# Patient Record
Sex: Female | Born: 1985
Health system: Southern US, Community
[De-identification: ages and names within clinical notes are randomized; demographics above are authoritative.]

## PROBLEM LIST (undated history)

## (undated) DIAGNOSIS — F329 Major depressive disorder, single episode, unspecified: Secondary | ICD-10-CM

## (undated) DIAGNOSIS — E282 Polycystic ovarian syndrome: Secondary | ICD-10-CM

## (undated) DIAGNOSIS — F319 Bipolar disorder, unspecified: Secondary | ICD-10-CM

## (undated) DIAGNOSIS — F32A Depression, unspecified: Secondary | ICD-10-CM

## (undated) DIAGNOSIS — R51 Headache: Secondary | ICD-10-CM

## (undated) DIAGNOSIS — R519 Headache, unspecified: Secondary | ICD-10-CM

## (undated) HISTORY — DX: Depression, unspecified: F32.A

## (undated) HISTORY — DX: Major depressive disorder, single episode, unspecified: F32.9

## (undated) HISTORY — DX: Polycystic ovarian syndrome: E28.2

## (undated) HISTORY — PX: CHOLECYSTECTOMY: SHX55

## (undated) HISTORY — PX: WISDOM TOOTH EXTRACTION: SHX21

## (undated) HISTORY — DX: Bipolar disorder, unspecified: F31.9

## (undated) HISTORY — DX: Headache, unspecified: R51.9

## (undated) HISTORY — PX: TONSILLECTOMY AND ADENOIDECTOMY: SUR1326

## (undated) HISTORY — DX: Headache: R51

---
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
## 2006-10-29 ENCOUNTER — Emergency Department: Payer: Self-pay | Admitting: Internal Medicine

## 2007-01-31 ENCOUNTER — Emergency Department: Payer: Self-pay | Admitting: Emergency Medicine

## 2007-02-03 ENCOUNTER — Emergency Department: Payer: Self-pay | Admitting: Emergency Medicine

## 2007-04-02 ENCOUNTER — Other Ambulatory Visit: Payer: Self-pay

## 2007-04-02 ENCOUNTER — Emergency Department: Payer: Self-pay

## 2007-04-05 ENCOUNTER — Emergency Department: Payer: Self-pay | Admitting: Internal Medicine

## 2007-08-10 ENCOUNTER — Emergency Department: Payer: Self-pay | Admitting: Emergency Medicine

## 2007-10-18 ENCOUNTER — Emergency Department: Payer: Self-pay | Admitting: Emergency Medicine

## 2008-04-14 ENCOUNTER — Emergency Department: Payer: Self-pay | Admitting: Emergency Medicine

## 2008-06-23 ENCOUNTER — Emergency Department: Payer: Self-pay | Admitting: Emergency Medicine

## 2009-07-06 ENCOUNTER — Emergency Department: Payer: Self-pay | Admitting: Emergency Medicine

## 2009-12-23 ENCOUNTER — Emergency Department: Payer: Self-pay | Admitting: Emergency Medicine

## 2010-02-07 ENCOUNTER — Emergency Department: Payer: Self-pay | Admitting: Emergency Medicine

## 2010-10-01 ENCOUNTER — Emergency Department: Payer: Self-pay | Admitting: Emergency Medicine

## 2011-05-31 ENCOUNTER — Emergency Department: Payer: Self-pay | Admitting: *Deleted

## 2011-12-04 ENCOUNTER — Emergency Department: Payer: Self-pay | Admitting: *Deleted

## 2011-12-04 LAB — URINALYSIS, COMPLETE
Bilirubin,UR: NEGATIVE
Blood: NEGATIVE
Glucose,UR: NEGATIVE mg/dL (ref 0–75)
Nitrite: NEGATIVE
Ph: 7 (ref 4.5–8.0)
Protein: NEGATIVE
RBC,UR: 1 /HPF (ref 0–5)
Specific Gravity: 1.011 (ref 1.003–1.030)
Squamous Epithelial: 5
WBC UR: 5 /HPF (ref 0–5)

## 2012-03-31 ENCOUNTER — Emergency Department: Payer: Self-pay | Admitting: *Deleted

## 2012-04-01 LAB — BASIC METABOLIC PANEL
Calcium, Total: 8.9 mg/dL (ref 8.5–10.1)
Chloride: 106 mmol/L (ref 98–107)
Co2: 26 mmol/L (ref 21–32)
Creatinine: 0.73 mg/dL (ref 0.60–1.30)
Potassium: 3.6 mmol/L (ref 3.5–5.1)
Sodium: 140 mmol/L (ref 136–145)

## 2012-04-01 LAB — CK TOTAL AND CKMB (NOT AT ARMC): CK-MB: <0.5 ng/mL — ABNORMAL LOW (ref 0.5–3.6)

## 2012-04-01 LAB — CBC
HCT: 38.9 % (ref 35.0–47.0)
HGB: 12.8 g/dL (ref 12.0–16.0)
MCH: 27.9 pg (ref 26.0–34.0)
MCHC: 32.8 g/dL (ref 32.0–36.0)
RDW: 14.4 % (ref 11.5–14.5)
WBC: 14.7 10*3/uL — ABNORMAL HIGH (ref 3.6–11.0)

## 2012-09-08 ENCOUNTER — Ambulatory Visit: Payer: Self-pay | Admitting: Family Medicine

## 2013-02-04 ENCOUNTER — Emergency Department: Payer: Self-pay | Admitting: Emergency Medicine

## 2013-02-04 LAB — URINALYSIS, COMPLETE
Bacteria: NONE SEEN
Bilirubin,UR: NEGATIVE
Blood: NEGATIVE
Glucose,UR: NEGATIVE mg/dL (ref 0–75)
Ketone: NEGATIVE
Nitrite: NEGATIVE
Protein: NEGATIVE
RBC,UR: 2 /HPF (ref 0–5)
Specific Gravity: 1.013 (ref 1.003–1.030)
WBC UR: 1 /HPF (ref 0–5)

## 2013-02-04 LAB — COMPREHENSIVE METABOLIC PANEL
Alkaline Phosphatase: 96 U/L (ref 50–136)
Anion Gap: 5 — ABNORMAL LOW (ref 7–16)
BUN: 8 mg/dL (ref 7–18)
Bilirubin,Total: 0.3 mg/dL (ref 0.2–1.0)
Co2: 28 mmol/L (ref 21–32)
Creatinine: 0.72 mg/dL (ref 0.60–1.30)
EGFR (African American): >60
EGFR (Non-African Amer.): >60
Osmolality: 273 (ref 275–301)
SGPT (ALT): 24 U/L (ref 12–78)
Sodium: 138 mmol/L (ref 136–145)

## 2013-02-04 LAB — CBC
MCHC: 33.6 g/dL (ref 32.0–36.0)
Platelet: 397 10*3/uL (ref 150–440)

## 2013-02-04 LAB — GC/CHLAMYDIA PROBE AMP

## 2013-05-03 ENCOUNTER — Emergency Department: Payer: Self-pay | Admitting: Emergency Medicine

## 2013-05-03 LAB — COMPREHENSIVE METABOLIC PANEL
Albumin: 3.7 g/dL (ref 3.4–5.0)
Alkaline Phosphatase: 103 U/L (ref 50–136)
Anion Gap: 4 — ABNORMAL LOW (ref 7–16)
Bilirubin,Total: 0.3 mg/dL (ref 0.2–1.0)
Calcium, Total: 8.8 mg/dL (ref 8.5–10.1)
Chloride: 106 mmol/L (ref 98–107)
Creatinine: 0.77 mg/dL (ref 0.60–1.30)
EGFR (African American): >60
EGFR (Non-African Amer.): >60
Glucose: 100 mg/dL — ABNORMAL HIGH (ref 65–99)
Osmolality: 273 (ref 275–301)
Potassium: 4.1 mmol/L (ref 3.5–5.1)
SGOT(AST): 18 U/L (ref 15–37)
SGPT (ALT): 24 U/L (ref 12–78)
Total Protein: 7.7 g/dL (ref 6.4–8.2)

## 2013-05-03 LAB — LIPASE, BLOOD: Lipase: 123 U/L (ref 73–393)

## 2013-05-03 LAB — CBC
HCT: 40.9 % (ref 35.0–47.0)
HGB: 13.7 g/dL (ref 12.0–16.0)
MCH: 28.7 pg (ref 26.0–34.0)
MCHC: 33.6 g/dL (ref 32.0–36.0)
MCV: 86 fL (ref 80–100)
WBC: 11.5 10*3/uL — ABNORMAL HIGH (ref 3.6–11.0)

## 2013-05-03 LAB — URINALYSIS, COMPLETE
Blood: NEGATIVE
Ketone: NEGATIVE
Nitrite: NEGATIVE
Ph: 5 (ref 4.5–8.0)
RBC,UR: 2 /HPF (ref 0–5)
Squamous Epithelial: 3

## 2013-05-03 LAB — PREGNANCY, URINE: Pregnancy Test, Urine: NEGATIVE m[IU]/mL

## 2013-09-12 ENCOUNTER — Emergency Department: Payer: Self-pay | Admitting: Internal Medicine

## 2014-03-10 ENCOUNTER — Emergency Department: Payer: Self-pay | Admitting: Emergency Medicine

## 2014-03-17 ENCOUNTER — Emergency Department: Payer: Self-pay | Admitting: Emergency Medicine

## 2014-06-01 ENCOUNTER — Emergency Department: Payer: Self-pay | Admitting: Emergency Medicine

## 2014-06-01 LAB — URINALYSIS, COMPLETE
Bacteria: NONE SEEN
Bilirubin,UR: NEGATIVE
Blood: NEGATIVE
Glucose,UR: NEGATIVE mg/dL (ref 0–75)
Ketone: NEGATIVE
Leukocyte Esterase: NEGATIVE
NITRITE: NEGATIVE
PROTEIN: NEGATIVE
Ph: 6 (ref 4.5–8.0)
RBC,UR: NONE SEEN /HPF (ref 0–5)
SPECIFIC GRAVITY: 1.01 (ref 1.003–1.030)
Squamous Epithelial: 6
WBC UR: 4 /HPF (ref 0–5)

## 2014-06-01 LAB — COMPREHENSIVE METABOLIC PANEL
AST: 16 U/L (ref 15–37)
Albumin: 3.5 g/dL (ref 3.4–5.0)
Alkaline Phosphatase: 87 U/L
Anion Gap: 6 — ABNORMAL LOW (ref 7–16)
BUN: 9 mg/dL (ref 7–18)
Bilirubin,Total: 0.3 mg/dL (ref 0.2–1.0)
CALCIUM: 8.6 mg/dL (ref 8.5–10.1)
CHLORIDE: 107 mmol/L (ref 98–107)
CO2: 27 mmol/L (ref 21–32)
Creatinine: 0.77 mg/dL (ref 0.60–1.30)
EGFR (African American): >60
Glucose: 87 mg/dL (ref 65–99)
Osmolality: 277 (ref 275–301)
POTASSIUM: 3.4 mmol/L — AB (ref 3.5–5.1)
SGPT (ALT): 23 U/L
SODIUM: 140 mmol/L (ref 136–145)
TOTAL PROTEIN: 7.4 g/dL (ref 6.4–8.2)

## 2014-06-01 LAB — CBC
HCT: 40.2 % (ref 35.0–47.0)
HGB: 12.9 g/dL (ref 12.0–16.0)
MCH: 27.6 pg (ref 26.0–34.0)
MCHC: 32 g/dL (ref 32.0–36.0)
MCV: 86 fL (ref 80–100)
Platelet: 431 10*3/uL (ref 150–440)
RBC: 4.67 10*6/uL (ref 3.80–5.20)
RDW: 14.2 % (ref 11.5–14.5)
WBC: 9.2 10*3/uL (ref 3.6–11.0)

## 2014-06-01 LAB — LIPASE, BLOOD: Lipase: 117 U/L (ref 73–393)

## 2014-06-08 ENCOUNTER — Ambulatory Visit: Payer: Self-pay | Admitting: Surgery

## 2014-06-09 HISTORY — PX: OTHER SURGICAL HISTORY: SHX169

## 2014-06-12 LAB — PATHOLOGY REPORT

## 2014-08-08 ENCOUNTER — Ambulatory Visit: Payer: Self-pay | Admitting: Psychiatry

## 2014-08-08 LAB — BASIC METABOLIC PANEL
Anion Gap: 6 — ABNORMAL LOW (ref 7–16)
BUN: 10 mg/dL (ref 7–18)
CO2: 26 mmol/L (ref 21–32)
CREATININE: 0.72 mg/dL (ref 0.60–1.30)
Calcium, Total: 8.4 mg/dL — ABNORMAL LOW (ref 8.5–10.1)
Chloride: 107 mmol/L (ref 98–107)
EGFR (Non-African Amer.): >60
GLUCOSE: 109 mg/dL — AB (ref 65–99)
OSMOLALITY: 277 (ref 275–301)
Potassium: 3.8 mmol/L (ref 3.5–5.1)
Sodium: 139 mmol/L (ref 136–145)

## 2014-08-08 LAB — LITHIUM LEVEL: Lithium: <0.2 mmol/L — ABNORMAL LOW

## 2014-08-08 LAB — TSH: THYROID STIMULATING HORM: 2.2 u[IU]/mL

## 2014-09-04 ENCOUNTER — Ambulatory Visit: Payer: Self-pay | Admitting: Psychiatry

## 2014-09-04 LAB — TSH: THYROID STIMULATING HORM: 2.43 u[IU]/mL

## 2014-09-04 LAB — BASIC METABOLIC PANEL
Anion Gap: 8 (ref 7–16)
BUN: 8 mg/dL (ref 7–18)
CHLORIDE: 107 mmol/L (ref 98–107)
CO2: 25 mmol/L (ref 21–32)
CREATININE: 0.67 mg/dL (ref 0.60–1.30)
Calcium, Total: 8.4 mg/dL — ABNORMAL LOW (ref 8.5–10.1)
EGFR (African American): >60
EGFR (Non-African Amer.): >60
Glucose: 78 mg/dL (ref 65–99)
Osmolality: 277 (ref 275–301)
Potassium: 3.8 mmol/L (ref 3.5–5.1)
SODIUM: 140 mmol/L (ref 136–145)

## 2014-09-04 LAB — LITHIUM LEVEL: Lithium: 0.35 mmol/L — ABNORMAL LOW

## 2014-12-23 NOTE — Op Note (Signed)
PATIENT NAME:  Alexis Rangel, Devika L MR#:  [id number] DATE OF BIRTH:  March 25, 1986  DATE OF PROCEDURE:  06/09/2014  PREOPERATIVE DIAGNOSIS: Symptomatic cholelithiasis.   POSTOPERATIVE DIAGNOSIS: Symptomatic cholelithiasis and chronic cholecystitis.   PROCEDURE PERFORMED:  Laparoscopic cholecystectomy.   ANESTHESIA: General.   ESTIMATED BLOOD LOSS: 15 mL.   COMPLICATIONS: None.   SPECIMENS: Gallbladder.   INDICATION FOR SURGERY: Miss [name] is a pleasant 29 year old female with recurrent right upper quadrant pain after fatty food ingestion and a CT scan which showed stones. Her pain was consistent with biliary disease and she was brought to the operating room suite for laparoscopic cholecystectomy.   DETAILS OF PROCEDURE: Informed consent was obtained. Miss [name] was brought to the operating room suite. She was induced. Endotracheal tube was placed, general anesthesia was administered. The abdomen was prepped and draped in standard surgical fashion. A timeout was performed, correctly identifying patient name, operative site, and procedure to be performed. A supraumbilical incision was made and was deepened down to the fascia. The fascia was incised. Peritoneum was entered. Two stay sutures were placed through the fasciotomy. A Hassan trocar was placed in the abdomen. An 11 mm epigastric and two 5 mm right subcostal trocars were placed at the midclavicular and anterior axillary line.  There was a large amount of adhesions to the liver and to the gallbladder, these were taken down. There was a small amount of liver which was decapsulated in the process. The gallbladder then when free was lifted over the dome of the liver and further adhesiolysis was performed and eventually the cystic artery and cystic duct were dissected out. The critical view was obtained. Three clips were placed across the cystic artery and cystic duct and ligated. The gallbladder was then taken off the gallbladder fossa and  brought out through an Endo Catch bag using hook cautery. The gallbladder fossa was then examined for hemostasis, it was made hemostatic. The abdomen was irrigated. After hemostasis was obtained and the irrigated fluid was cleared the abdomen was desufflated, all trocars were taken out under direct visualization. The previous figure-of-eight 0 Vicryl was used to close the supraumbilical fascia. The skin and sutures were infiltrated with 1% lidocaine with epinephrine. The skin was then closed with interrupted 4-0 Monocryl deep dermal sutures. Steri-Strips, Telfa gauze, and Tegaderm were used to complete the dressing. The patient was then awoken, extubated, and brought to the postanesthesia care unit. There were no immediate complications.  Needle, sponge, and instrument counts were correct at the end of the procedure.    ____________________________ Si Raiderhristopher A. Malay Fantroy, MD cal:bu D: 06/09/2014 19:18:00 ET T: 06/09/2014 20:07:07 ET JOB#: [id number]  cc: Cristal Deerhristopher A. Jahvier Aldea, MD, <Dictator> Jarvis NewcomerHRISTOPHER A Tyberius Ryner MD ELECTRONICALLY SIGNED 06/19/2014 7:09

## 2015-05-08 ENCOUNTER — Encounter: Payer: Self-pay | Admitting: Emergency Medicine

## 2015-05-08 ENCOUNTER — Emergency Department
Admission: EM | Admit: 2015-05-08 | Discharge: 2015-05-08 | Disposition: A | Discharge status: Home / Self Care | Payer: PRIVATE HEALTH INSURANCE | Attending: Emergency Medicine | Admitting: Emergency Medicine

## 2015-05-08 DIAGNOSIS — R2243 Localized swelling, mass and lump, lower limb, bilateral: Secondary | ICD-10-CM | POA: Diagnosis present

## 2015-05-08 DIAGNOSIS — R609 Edema, unspecified: Secondary | ICD-10-CM

## 2015-05-08 DIAGNOSIS — R600 Localized edema: Secondary | ICD-10-CM | POA: Insufficient documentation

## 2015-05-08 LAB — COMPREHENSIVE METABOLIC PANEL
ALT: 26 U/L (ref 14–54)
AST: 26 U/L (ref 15–41)
Albumin: 4 g/dL (ref 3.5–5.0)
Alkaline Phosphatase: 89 U/L (ref 38–126)
Anion gap: 7 (ref 5–15)
BUN: 11 mg/dL (ref 6–20)
CHLORIDE: 99 mmol/L — AB (ref 101–111)
CO2: 23 mmol/L (ref 22–32)
CREATININE: 0.75 mg/dL (ref 0.44–1.00)
Calcium: 8.4 mg/dL — ABNORMAL LOW (ref 8.9–10.3)
GFR calc non Af Amer: >60 mL/min (ref >60)
Glucose, Bld: 88 mg/dL (ref 65–99)
Potassium: 3.7 mmol/L (ref 3.5–5.1)
SODIUM: 129 mmol/L — AB (ref 135–145)
Total Bilirubin: 0.5 mg/dL (ref 0.3–1.2)
Total Protein: 7.7 g/dL (ref 6.5–8.1)

## 2015-05-08 LAB — CBC
HEMATOCRIT: 39 % (ref 35.0–47.0)
HEMOGLOBIN: 12.8 g/dL (ref 12.0–16.0)
MCH: 27.3 pg (ref 26.0–34.0)
MCHC: 32.8 g/dL (ref 32.0–36.0)
MCV: 83.1 fL (ref 80.0–100.0)
Platelets: 453 10*3/uL — ABNORMAL HIGH (ref 150–440)
RBC: 4.7 MIL/uL (ref 3.80–5.20)
RDW: 14 % (ref 11.5–14.5)
WBC: 11.7 10*3/uL — AB (ref 3.6–11.0)

## 2015-05-08 MED ORDER — FUROSEMIDE 20 MG PO TABS: 20.0000 mg | ORAL_TABLET | Freq: Every day | ORAL | Status: DC

## 2015-05-08 NOTE — ED Provider Notes (Signed)
[name] Emergency Department Provider Note     Time seen: ----------------------------------------- 6:13 PM on 05/08/2015 -----------------------------------------    I have reviewed the triage vital signs and the nursing notes.   HISTORY  Chief Complaint Leg Swelling    HPI Alexis Rangel is a 29 y.o. female who presents to ER with bilateral lower extremity swelling for the past several months. She denies any other symptoms. Patient states swelling is worse at night, gets better throughout the day. She states she has been gaining weight over the last year, stands a lot at work. She denies fevers chills or other complaints. She also denies chest pain or shortness of breath   History reviewed. No pertinent past medical history.  There are no active problems to display for this patient.   History reviewed. No pertinent past surgical history.  Allergies Review of patient's allergies indicates no known allergies.  Social History Social History  Substance Use Topics  . Smoking status: Never Smoker   . Smokeless tobacco: None  . Alcohol Use: No    Review of Systems Constitutional: Negative for fever. Eyes: Negative for visual changes. ENT: Negative for sore throat. Cardiovascular: Negative for chest pain. Respiratory: Negative for shortness of breath. Gastrointestinal: Negative for abdominal pain, vomiting and diarrhea. Genitourinary: Negative for dysuria. Musculoskeletal: Negative for back pain. Positive for lower extremity swelling bilaterally Skin: Negative for rash. Neurological: Negative for headaches, focal weakness or numbness.  10-point ROS otherwise negative.  ____________________________________________   PHYSICAL EXAM:  VITAL SIGNS: ED Triage Vitals  Enc Vitals Group     BP 05/08/15 1701 130/88 mmHg     Pulse Rate 05/08/15 1701 91     Resp 05/08/15 1701 20     Temp 05/08/15 1701 98.4 F (36.9 C)     Temp Source  05/08/15 1701 Oral     SpO2 05/08/15 1701 100 %     Weight 05/08/15 1701 200 lb (90.719 kg)     Height 05/08/15 1701  (1.575 m)     Head Cir --      Peak Flow --      Pain Score --      Pain Loc --      Pain Edu? --      Excl. in GC? --     Constitutional: Alert and oriented. Well appearing and in no distress. Eyes: Conjunctivae are normal. PERRL. Normal extraocular movements. ENT   Head: Normocephalic and atraumatic.   Nose: No congestion/rhinnorhea.   Mouth/Throat: Mucous membranes are moist.   Neck: No stridor. Cardiovascular: Normal rate, regular rhythm. Normal and symmetric distal pulses are present in all extremities. No murmurs, rubs, or gallops. Respiratory: Normal respiratory effort without tachypnea nor retractions. Breath sounds are clear and equal bilaterally. No wheezes/rales/rhonchi. Gastrointestinal: Soft and nontender. No distention. No abdominal bruits.  Musculoskeletal: Nontender with normal range of motion in all extremities. No joint effusions.  No lower extremity tenderness nor edema. Neurologic:  Normal speech and language. No gross focal neurologic deficits are appreciated. Speech is normal. No gait instability. Skin:  Skin is warm, dry and intact. No rash noted. Psychiatric: Mood and affect are normal. Speech and behavior are normal. Patient exhibits appropriate insight and judgment.  ____________________________________________  ED COURSE:  Pertinent labs & imaging results that were available during my care of the patient were reviewed by me and considered in my medical decision making (see chart for details). Patient with nonspecific lower extremity edema, will check basic labs.  ____________________________________________    LABS (pertinent positives/negatives)  Labs Reviewed  CBC - Abnormal; Notable for the following:    WBC 11.7 (*)    Platelets 453 (*)    All other components within normal limits  COMPREHENSIVE METABOLIC PANEL  - Abnormal; Notable for the following:    Sodium 129 (*)    Chloride 99 (*)    Calcium 8.4 (*)    All other components within normal limits    ____________________________________________  FINAL ASSESSMENT AND PLAN  Edema  Plan: Patient with labs and imaging as dictated above. Edema is not significantly visible on physical examination. She'll be given low-dose Lasix and encouraged follow-up with her doctor.   Emily Filbert, MD   Emily Filbert, MD 05/08/15 [phone]

## 2015-05-08 NOTE — ED Notes (Signed)
Patient to ED with c/o bilateral lower leg swelling for the last couple of months, denies associated symptoms.

## 2015-05-08 NOTE — Discharge Instructions (Signed)
Peripheral Edema °You have swelling in your legs (peripheral edema). This swelling is due to excess accumulation of salt and water in your body. Edema may be a sign of heart, kidney or liver disease, or a side effect of a medication. It may also be due to problems in the leg veins. Elevating your legs and using special support stockings may be very helpful, if the cause of the swelling is due to poor venous circulation. Avoid long periods of standing, whatever the cause. °Treatment of edema depends on identifying the cause. Chips, pretzels, pickles and other salty foods should be avoided. Restricting salt in your diet is almost always needed. Water pills (diuretics) are often used to remove the excess salt and water from your body via urine. These medicines prevent the kidney from reabsorbing sodium. This increases urine flow. °Diuretic treatment may also result in lowering of potassium levels in your body. Potassium supplements may be needed if you have to use diuretics daily. Daily weights can help you keep track of your progress in clearing your edema. You should call your caregiver for follow up care as recommended. °SEEK IMMEDIATE MEDICAL CARE IF:  °· You have increased swelling, pain, redness, or heat in your legs. °· You develop shortness of breath, especially when lying down. °· You develop chest or abdominal pain, weakness, or fainting. °· You have a fever. °Document Released: 09/25/2004 Document Revised: 11/10/2011 Document Reviewed: 09/05/2009 °ExitCare® Patient Information ©2015 [employer]. This information is not intended to replace advice given to you by your health care provider. Make sure you discuss any questions you have with your health care provider. ° °

## 2015-10-16 DIAGNOSIS — K8020 Calculus of gallbladder without cholecystitis without obstruction: Secondary | ICD-10-CM | POA: Insufficient documentation

## 2015-10-17 ENCOUNTER — Encounter: Payer: Self-pay | Admitting: Unknown Physician Specialty

## 2015-10-17 ENCOUNTER — Ambulatory Visit (INDEPENDENT_AMBULATORY_CARE_PROVIDER_SITE_OTHER): Payer: BLUE CROSS/BLUE SHIELD | Admitting: Unknown Physician Specialty

## 2015-10-17 VITALS — BP 121/82 | HR 86 | Temp 98.2°F | Ht 62.0 in | Wt 216.6 lb

## 2015-10-17 DIAGNOSIS — R112 Nausea with vomiting, unspecified: Secondary | ICD-10-CM

## 2015-10-17 DIAGNOSIS — K529 Noninfective gastroenteritis and colitis, unspecified: Secondary | ICD-10-CM | POA: Diagnosis not present

## 2015-10-17 DIAGNOSIS — G43909 Migraine, unspecified, not intractable, without status migrainosus: Secondary | ICD-10-CM | POA: Insufficient documentation

## 2015-10-17 DIAGNOSIS — E669 Obesity, unspecified: Secondary | ICD-10-CM | POA: Insufficient documentation

## 2015-10-17 DIAGNOSIS — G43001 Migraine without aura, not intractable, with status migrainosus: Secondary | ICD-10-CM

## 2015-10-17 DIAGNOSIS — J029 Acute pharyngitis, unspecified: Secondary | ICD-10-CM | POA: Diagnosis not present

## 2015-10-17 LAB — PREGNANCY, URINE: PREG TEST UR: NEGATIVE

## 2015-10-17 NOTE — Progress Notes (Signed)
BP 121/82 mmHg  Pulse 86  Temp(Src) 98.2 F (36.8 C)  Ht  (1.575 m)  Wt 216 lb 9.6 oz (98.249 kg)  BMI 39.61 kg/m2  SpO2 100%  LMP 10/04/2015 (Approximate)   Subjective:    Patient ID: Alexis Rangel, female    DOB: 12/26/1985, 30 y.o.   MRN: [id number]  HPI: Alexis Rangel is a 30 y.o. female  Chief Complaint  Patient presents with  . Emesis    pt states she has been throwing up since yesterday morning. States she has also been gagging and feels like something is stuck in her throat. States she has tried taking nyquil  . Migraine    pt states she has been having migraines for 2 to 3 months   Nausea/vomiting Nausea, vomiting, and diarrhea.  She has vomited twice yesterday but went out last night for dinner and has not vomited but is nauseated. Keeping down fluids.  Having intermittent diarrhea.  No fever.  On BCPs and does occasionally miss them.  LMP was 2 weeks ago  Migraines  States they start in the back of her neck and radiates to above eyes.  She needs to turn off the lights as it throbs.  Takes Tylenol which helps for an hour.  Drinks a lot of [employer].  Her headaches have been going on for years and worse in the last 3 months.  She states her father does have a history of migraines and is on medications for this. She does admit to increased stress in the last 3-4 months  Obesity Frustrated with weight gain  Pharyngitis For 3 days now.  This preceded her GI symptoms  Relevant past medical, surgical, family and social history reviewed and updated as indicated. Interim medical history since our last visit reviewed. Allergies and medications reviewed and updated.  Review of Systems  Constitutional: Negative.   HENT: Positive for sore throat.   Eyes: Negative.   Respiratory: Negative.   Genitourinary: Negative.     Per HPI unless specifically indicated above     Objective:    BP 121/82 mmHg  Pulse 86  Temp(Src) 98.2 F (36.8 C)  Ht   (1.575 m)  Wt 216 lb 9.6 oz (98.249 kg)  BMI 39.61 kg/m2  SpO2 100%  LMP 10/04/2015 (Approximate)  Wt Readings from Last 3 Encounters:  10/17/15 216 lb 9.6 oz (98.249 kg)  07/17/14 205 lb (92.987 kg)  05/08/15 200 lb (90.719 kg)    Physical Exam  Constitutional: She is oriented to person, place, and time. She appears well-developed and well-nourished. No distress.  HENT:  Head: Normocephalic and atraumatic.  Eyes: Conjunctivae and lids are normal. Right eye exhibits no discharge. Left eye exhibits no discharge. No scleral icterus.  Neck: Normal range of motion. Neck supple. No JVD present. Carotid bruit is not present.  Cardiovascular: Normal rate, regular rhythm and normal heart sounds.   Pulmonary/Chest: Effort normal and breath sounds normal.  Abdominal: Soft. Normal appearance and bowel sounds are normal. She exhibits no distension. There is no splenomegaly or hepatomegaly. There is no tenderness. There is no rebound and no guarding.  Musculoskeletal: Normal range of motion.  Neurological: She is alert and oriented to person, place, and time.  Skin: Skin is warm, dry and intact. No rash noted. No pallor.  Psychiatric: She has a normal mood and affect. Her behavior is normal. Judgment and thought content normal.   Pregnancy test is negative Strep is negative  Results for orders placed or performed during the hospital encounter of 05/08/15  CBC  Result Value Ref Range   WBC 11.7 (H) 3.6 - 11.0 K/uL   RBC 4.70 3.80 - 5.20 MIL/uL   Hemoglobin 12.8 12.0 - 16.0 g/dL   HCT 54.0 98.1 - 19.1 %   MCV 83.1 80.0 - 100.0 fL   MCH 27.3 26.0 - 34.0 pg   MCHC 32.8 32.0 - 36.0 g/dL   RDW 47.8 29.5 - 62.1 %   Platelets 453 (H) 150 - 440 K/uL  Comprehensive metabolic panel  Result Value Ref Range   Sodium 129 (L) 135 - 145 mmol/L   Potassium 3.7 3.5 - 5.1 mmol/L   Chloride 99 (L) 101 - 111 mmol/L   CO2 23 22 - 32 mmol/L   Glucose, Bld 88 65 - 99 mg/dL   BUN 11 6 - 20 mg/dL    Creatinine, Ser 3.08 0.44 - 1.00 mg/dL   Calcium 8.4 (L) 8.9 - 10.3 mg/dL   Total Protein 7.7 6.5 - 8.1 g/dL   Albumin 4.0 3.5 - 5.0 g/dL   AST 26 15 - 41 U/L   ALT 26 14 - 54 U/L   Alkaline Phosphatase 89 38 - 126 U/L   Total Bilirubin 0.5 0.3 - 1.2 mg/dL   GFR calc non Af Amer >60 >60 mL/min   GFR calc Af Amer >60 >60 mL/min   Anion gap 7 5 - 15      Assessment & Plan:   Problem List Items Addressed This Visit      Unprioritized   Obesity    Discussed decreasing and eliminating Mountain Dew      Migraine    Usually during periods.  Change to non-cyclical BCPs.  No aura.  Gradually cut back on soft drinks.  exercise       Other Visit Diagnoses    Non-intractable vomiting with nausea, vomiting of unspecified type    -  Primary    Relevant Orders    Pregnancy, urine    Gastroenteritis        Supportive care    Pharyngitis        Relevant Orders    Rapid strep screen (not at [facility])        Follow up plan: Return in about 1 month (around 11/14/2015).

## 2015-10-17 NOTE — Assessment & Plan Note (Signed)
Discussed decreasing and eliminating [facility]

## 2015-10-17 NOTE — Assessment & Plan Note (Signed)
Usually during periods.  Change to non-cyclical BCPs.  No aura.  Gradually cut back on soft drinks.  exercise

## 2015-10-17 NOTE — Patient Instructions (Addendum)
Migraine Headache A migraine headache is an intense, throbbing pain on one or both sides of your head. A migraine can last for 30 minutes to several hours. CAUSES  The exact cause of a migraine headache is not always known. However, a migraine may be caused when nerves in the brain become irritated and release chemicals that cause inflammation. This causes pain. Certain things may also trigger migraines, such as:  Alcohol.  Smoking.  Stress.  Menstruation.  Aged cheeses.  Foods or drinks that contain nitrates, glutamate, aspartame, or tyramine.  Lack of sleep.  Chocolate.  Caffeine.  Hunger.  Physical exertion.  Fatigue.  Medicines used to treat chest pain (nitroglycerine), birth control pills, estrogen, and some blood pressure medicines. SIGNS AND SYMPTOMS  Pain on one or both sides of your head.  Pulsating or throbbing pain.  Severe pain that prevents daily activities.  Pain that is aggravated by any physical activity.  Nausea, vomiting, or both.  Dizziness.  Pain with exposure to bright lights, loud noises, or activity.  General sensitivity to bright lights, loud noises, or smells. Before you get a migraine, you may get warning signs that a migraine is coming (aura). An aura may include:  Seeing flashing lights.  Seeing bright spots, halos, or zigzag lines.  Having tunnel vision or blurred vision.  Having feelings of numbness or tingling.  Having trouble talking.  Having muscle weakness. DIAGNOSIS  A migraine headache is often diagnosed based on:  Symptoms.  Physical exam.  A CT scan or MRI of your head. These imaging tests cannot diagnose migraines, but they can help rule out other causes of headaches. TREATMENT Medicines may be given for pain and nausea. Medicines can also be given to help prevent recurrent migraines.  HOME CARE INSTRUCTIONS  Only take over-the-counter or prescription medicines for pain or discomfort as directed by your  health care provider. The use of long-term narcotics is not recommended.  Lie down in a dark, quiet room when you have a migraine.  Keep a journal to find out what may trigger your migraine headaches. For example, write down:  What you eat and drink.  How much sleep you get.  Any change to your diet or medicines.  Limit alcohol consumption.  Quit smoking if you smoke.  Get 7-9 hours of sleep, or as recommended by your health care provider.  Limit stress.  Keep lights dim if bright lights bother you and make your migraines worse. SEEK IMMEDIATE MEDICAL CARE IF:   Your migraine becomes severe.  You have a fever.  You have a stiff neck.  You have vision loss.  You have muscular weakness or loss of muscle control.  You start losing your balance or have trouble walking.  You feel faint or pass out.  You have severe symptoms that are different from your first symptoms. MAKE SURE YOU:   Understand these instructions.  Will watch your condition.  Will get help right away if you are not doing well or get worse.   This information is not intended to replace advice given to you by your health care provider. Make sure you discuss any questions you have with your health care provider.   Document Released: 08/18/2005 Document Revised: 09/08/2014 Document Reviewed: 04/25/2013 Elsevier Interactive Patient Education 2016 [employer].   Try taking Magnesium supplements twice a day Change to non-cyclical pills.  One pack every 21 days

## 2015-10-19 LAB — RAPID STREP SCREEN (MED CTR MEBANE ONLY): STREP GP A AG, IA W/REFLEX: NEGATIVE

## 2015-10-19 LAB — CULTURE, GROUP A STREP: STREP A CULTURE: NEGATIVE

## 2015-11-15 ENCOUNTER — Ambulatory Visit (INDEPENDENT_AMBULATORY_CARE_PROVIDER_SITE_OTHER): Payer: BLUE CROSS/BLUE SHIELD | Admitting: Family Medicine

## 2015-11-15 ENCOUNTER — Encounter: Payer: Self-pay | Admitting: Family Medicine

## 2015-11-15 VITALS — BP 120/80 | HR 94 | Temp 99.2°F | Ht 62.0 in | Wt 216.0 lb

## 2015-11-15 DIAGNOSIS — Z30018 Encounter for initial prescription of other contraceptives: Secondary | ICD-10-CM

## 2015-11-15 DIAGNOSIS — G43001 Migraine without aura, not intractable, with status migrainosus: Secondary | ICD-10-CM

## 2015-11-15 MED ORDER — AMITRIPTYLINE HCL 25 MG PO TABS: 25.0000 mg | ORAL_TABLET | Freq: Every day | ORAL | Status: DC

## 2015-11-15 MED ORDER — NORELGESTROMIN-ETH ESTRADIOL 150-35 MCG/24HR TD PTWK: 1.0000 | MEDICATED_PATCH | TRANSDERMAL | Status: DC

## 2015-11-15 NOTE — Progress Notes (Signed)
BP 120/80 mmHg  Pulse 94  Temp(Src) 99.2 F (37.3 C)  Ht  (1.575 m)  Wt 216 lb (97.977 kg)  BMI 39.50 kg/m2  SpO2 100%  LMP 11/01/2015 (Approximate)   Subjective:    Patient ID: Alexis Rangel, female    DOB: 1985/09/03, 30 y.o.   MRN: [id number]  HPI: Alexis Rangel is a 30 y.o. female  Chief Complaint  Patient presents with  . Migraine   MIGRAINES Duration: years, dad has had problems with migraines and takes medicine Onset: sudden Severity: severe Quality: "just hurts" Frequency: 1-2x per week Location: from the back of her head up to the forehead Headache duration: couple of hours to 3 days Radiation: no Time of day headache occurs: random times Alleviating factors: nothing Aggravating factors: light Headache status at time of visit: asymptomatic Treatments attempted: Treatments attempted: rest, APAP, ibuprofen and aleve", excedrine   Aura: yes Nausea:  yes Vomiting: yes occaionally Photophobia:  yes Phonophobia:  no Effect on social functioning:  yes Numbers of missed days of school/work each month: 1x every couple of months Confusion:  no Gait disturbance/ataxia:  no Behavioral changes:  no Fevers:  no  CONTRACEPTION CONCERNS- wants to go on something that gives her a period (she thinks) and that she doesn't have to take every day. Contraception: pill, often forgets to take it for days at a time Previous contraception: condoms, pills, patch, nuvoring Sexual activity: yes Gravida/Para: G0P0 Average interval between menses: monthly Length of menses: 3-4 days Flow: mild Dysmenorrhea: no   Relevant past medical, surgical, family and social history reviewed and updated as indicated. Interim medical history since our last visit reviewed. Allergies and medications reviewed and updated.  Review of Systems  Constitutional: Negative.   Respiratory: Negative.   Cardiovascular: Negative.   Neurological: Positive for headaches. Negative for  dizziness, tremors, seizures, syncope, facial asymmetry, speech difficulty, weakness, light-headedness and numbness.  Psychiatric/Behavioral: Negative.     Per HPI unless specifically indicated above     Objective:    BP 120/80 mmHg  Pulse 94  Temp(Src) 99.2 F (37.3 C)  Ht  (1.575 m)  Wt 216 lb (97.977 kg)  BMI 39.50 kg/m2  SpO2 100%  LMP 11/01/2015 (Approximate)  Wt Readings from Last 3 Encounters:  11/15/15 216 lb (97.977 kg)  10/17/15 216 lb 9.6 oz (98.249 kg)  07/17/14 205 lb (92.987 kg)    Physical Exam  Constitutional: She is oriented to person, place, and time. She appears well-developed and well-nourished. No distress.  HENT:  Head: Normocephalic and atraumatic.  Right Ear: Hearing normal.  Left Ear: Hearing normal.  Nose: Nose normal.  Eyes: Conjunctivae and lids are normal. Right eye exhibits no discharge. Left eye exhibits no discharge. No scleral icterus.  Cardiovascular: Normal rate, regular rhythm, normal heart sounds and intact distal pulses.  Exam reveals no gallop and no friction rub.   No murmur heard. Pulmonary/Chest: Effort normal and breath sounds normal. No respiratory distress. She has no wheezes. She has no rales. She exhibits no tenderness.  Musculoskeletal: Normal range of motion.  Neurological: She is alert and oriented to person, place, and time.  Skin: Skin is warm, dry and intact. No rash noted. No erythema. No pallor.  Psychiatric: She has a normal mood and affect. Her speech is normal and behavior is normal. Judgment and thought content normal. Cognition and memory are normal.  Nursing note and vitals reviewed.   Results for orders placed or performed in visit  on 10/17/15  [facility])  Result Value Ref Range   Strep Gp A Ag, IA W/Reflex Negative Negative  Culture, Group A Strep  Result Value Ref Range   Strep A Culture Negative   Pregnancy, urine  Result Value Ref Range   Preg Test, Ur Negative Negative       Assessment & Plan:   Problem List Items Addressed This Visit      Cardiovascular and Mediastinum   Migraine - Primary    Will start amitriptyline for migraine prophylaxis. Would likely not do well with more than daily dosing. Recheck 1 month to see how she's doing.       Relevant Medications   amitriptyline (ELAVIL) 25 MG tablet    Other Visit Diagnoses    Encounter for initial prescription of other contraceptives        Will start patch as she doesn't remember to take her pills. Is interested in whether she is infertile and possibly getting IUD. Referral to GYN made today.    Relevant Orders    Ambulatory referral to Gynecology        Follow up plan: Return in about 4 weeks (around 12/13/2015) for with CW- follow up OCP and migraines.

## 2015-11-15 NOTE — Assessment & Plan Note (Signed)
Will start amitriptyline for migraine prophylaxis. Would likely not do well with more than daily dosing. Recheck 1 month to see how she's doing.

## 2015-11-15 NOTE — Patient Instructions (Signed)
Contraception Choices  Birth control (contraception) is the use of any methods or devices to stop pregnancy from happening. Below are some methods to help avoid pregnancy.  HORMONAL BIRTH CONTROL  · A small tube put under the skin of the upper arm (implant). The tube can stay in place for 3 years. The implant must be taken out after 3 years.  · Shots given every 3 months.  · Pills taken every day.  · Patches that are changed once a week.  · A ring put into the vagina (vaginal ring). The ring is left in place for 3 weeks and removed for 1 week. Then, a new ring is put in the vagina.  · Emergency birth control pills taken after unprotected sex (intercourse).  BARRIER BIRTH CONTROL   · A thin covering worn on the penis (female condom) during sex.  · A soft, loose covering put into the vagina (female condom) before sex.  · A rubber bowl that sits over the cervix (diaphragm). The bowl must be made for you. The bowl is put into the vagina before sex. The bowl is left in place for 6 to 8 hours after sex.  · A small, soft cup that fits over the cervix (cervical cap). The cup must be made for you. The cup can be left in place for 48 hours after sex.  · A sponge that is put into the vagina before sex.  · A chemical that kills or stops sperm from getting into the cervix and uterus (spermicide). The chemical may be a cream, jelly, foam, or pill.  INTRAUTERINE (IUD) BIRTH CONTROL   · IUD birth control is a small, T-shaped piece of plastic. The plastic is put inside the uterus. There are 2 types of IUD:    Copper IUD. The IUD is covered in copper wire. The copper makes a fluid that kills sperm. It can stay in place for 10 years.    Hormone IUD. The hormone stops pregnancy from happening. It can stay in place for 5 years.  PERMANENT METHODS  · When the woman has her fallopian tubes sealed, tied, or blocked during surgery. This stops the egg from traveling to the uterus.  · The doctor places a small coil or insert into each fallopian  tube. This causes scar tissue to form and blocks the fallopian tubes.  · When the female has the tubes that carry sperm tied off (vasectomy).  NATURAL FAMILY PLANNING BIRTH CONTROL   · Natural family planning means not having sex or using barrier birth control on the days the woman could become pregnant.  · Use a calendar to keep track of the length of each period and know the days she can get pregnant.  · Avoid sex during ovulation.  · Use a thermometer to measure body temperature. Also watch for symptoms of ovulation.  · Time sex to be after the woman has ovulated.  Use condoms to help protect yourself against sexually transmitted infections (STIs). Do this no matter what type of birth control you use. Talk to your doctor about which type of birth control is best for you.     This information is not intended to replace advice given to you by your health care provider. Make sure you discuss any questions you have with your health care provider.     Document Released: 06/15/2009 Document Revised: 08/23/2013 Document Reviewed: 03/09/2013  Elsevier Interactive Patient Education ©2016 [employer] Inc[employer]

## 2015-12-17 ENCOUNTER — Ambulatory Visit: Payer: BLUE CROSS/BLUE SHIELD | Admitting: Unknown Physician Specialty

## 2015-12-18 ENCOUNTER — Ambulatory Visit (INDEPENDENT_AMBULATORY_CARE_PROVIDER_SITE_OTHER): Payer: BLUE CROSS/BLUE SHIELD | Admitting: Unknown Physician Specialty

## 2015-12-18 ENCOUNTER — Encounter: Payer: Self-pay | Admitting: Unknown Physician Specialty

## 2015-12-18 VITALS — BP 130/84 | HR 85 | Temp 98.2°F | Ht 61.7 in | Wt 218.2 lb

## 2015-12-18 DIAGNOSIS — G43001 Migraine without aura, not intractable, with status migrainosus: Secondary | ICD-10-CM | POA: Diagnosis not present

## 2015-12-18 DIAGNOSIS — Z3009 Encounter for other general counseling and advice on contraception: Secondary | ICD-10-CM

## 2015-12-18 DIAGNOSIS — F329 Major depressive disorder, single episode, unspecified: Secondary | ICD-10-CM | POA: Diagnosis not present

## 2015-12-18 DIAGNOSIS — F32A Depression, unspecified: Secondary | ICD-10-CM

## 2015-12-18 MED ORDER — CITALOPRAM HYDROBROMIDE 20 MG PO TABS: 20.0000 mg | ORAL_TABLET | Freq: Every day | ORAL | Status: DC

## 2015-12-18 NOTE — Assessment & Plan Note (Signed)
Not interested in getting pregnant now but would like to discuss whether pregnancy is even an option due to her PCOS as has been told she infertile by family members.  She is awaiting a work-up with gyn.

## 2015-12-18 NOTE — Patient Instructions (Signed)
Go to [employer][email or web address] Go to "pick a therapist" Fill in the information.

## 2015-12-18 NOTE — Progress Notes (Signed)
BP 130/84 mmHg  Pulse 85  Temp(Src) 98.2 F (36.8 C)  Ht 5' 1.7" (1.567 m)  Wt 218 lb 3.2 oz (98.975 kg)  BMI 40.31 kg/m2  SpO2 85%  LMP  (LMP Unknown)   Subjective:    Patient ID: Alexis Rangel, female    DOB: [birth year], 30 y.o.   MRN: [id number]  HPI: BRITANY Rangel is a 30 y.o. female  Chief Complaint  Patient presents with  . Migraine  . Contraception   Contraception Forgetting to take BCPs but found patches were too expensive. She was referred to gynecology for evaluation of PCOS, ? Infertility, and decision about contraception.  This appointment is upcoming.    Migraines Better since stopping BCPs.  Never did pick up the Amitriptyline.  Advil mostly works.    Depression This has been going on for the last 2 months.  States she is arguing with her boyfriend a lot.  She also overreacts to things.    Depression screen PHQ 2/9 12/18/2015  Decreased Interest 1  Down, Depressed, Hopeless 2  PHQ - 2 Score 3  Altered sleeping 1  Tired, decreased energy 2  Change in appetite 3  Feeling bad or failure about yourself  2  Trouble concentrating 2  Moving slowly or fidgety/restless 2  Suicidal thoughts 0  PHQ-9 Score 15    Relevant past medical, surgical, family and social history reviewed and updated as indicated. Interim medical history since our last visit reviewed. Allergies and medications reviewed and updated.  Review of Systems  Per HPI unless specifically indicated above     Objective:    BP 130/84 mmHg  Pulse 85  Temp(Src) 98.2 F (36.8 C)  Ht 5' 1.7" (1.567 m)  Wt 218 lb 3.2 oz (98.975 kg)  BMI 40.31 kg/m2  SpO2 85%  LMP  (LMP Unknown)  Wt Readings from Last 3 Encounters:  12/18/15 218 lb 3.2 oz (98.975 kg)  11/15/15 216 lb (97.977 kg)  10/17/15 216 lb 9.6 oz (98.249 kg)    Physical Exam  Constitutional: She is oriented to person, place, and time. She appears well-developed and well-nourished. No distress.  HENT:  Head:  Normocephalic and atraumatic.  Eyes: Conjunctivae and lids are normal. Right eye exhibits no discharge. Left eye exhibits no discharge. No scleral icterus.  Cardiovascular: Normal rate.   Pulmonary/Chest: Effort normal.  Abdominal: Normal appearance. There is no splenomegaly or hepatomegaly.  Musculoskeletal: Normal range of motion.  Neurological: She is alert and oriented to person, place, and time.  Skin: Skin is intact. No rash noted. No pallor.  Psychiatric: She has a normal mood and affect. Her behavior is normal. Judgment and thought content normal.      Assessment & Plan:   Problem List Items Addressed This Visit      Unprioritized   Depression    Start Citalopram 20 mg.  Encouraged counseling      Relevant Medications   citalopram (CELEXA) 20 MG tablet   Family planning - Primary    Not interested in getting pregnant now but would like to discuss whether pregnancy is even an option due to her PCOS as has been told she infertile by family members.  She is awaiting a work-up with gyn.        Migraine    Better now but managable with Ibuprofen.  Will continue to follow      Relevant Medications   ibuprofen (ADVIL) 200 MG tablet   citalopram (CELEXA) 20 MG  tablet       Follow up plan: Return in about 4 weeks (around 01/15/2016).

## 2015-12-18 NOTE — Assessment & Plan Note (Addendum)
Start Citalopram 20 mg.  Encouraged counseling

## 2015-12-18 NOTE — Assessment & Plan Note (Addendum)
Better now but managable with Ibuprofen.  Will continue to follow

## 2015-12-28 ENCOUNTER — Encounter: Payer: Self-pay | Admitting: Obstetrics and Gynecology

## 2015-12-28 ENCOUNTER — Ambulatory Visit (INDEPENDENT_AMBULATORY_CARE_PROVIDER_SITE_OTHER): Payer: BLUE CROSS/BLUE SHIELD | Admitting: Obstetrics and Gynecology

## 2015-12-28 VITALS — Ht 62.0 in | Wt 216.1 lb

## 2015-12-28 DIAGNOSIS — N926 Irregular menstruation, unspecified: Secondary | ICD-10-CM

## 2015-12-28 DIAGNOSIS — Z3040 Encounter for surveillance of contraceptives, unspecified: Secondary | ICD-10-CM | POA: Diagnosis not present

## 2015-12-28 DIAGNOSIS — E669 Obesity, unspecified: Secondary | ICD-10-CM | POA: Diagnosis not present

## 2015-12-28 NOTE — Progress Notes (Signed)
Subjective:     Patient ID: Alexis Rangel, female   DOB: [birth year], 30 y.o.   MRN: [id number]  HPI Here to discuss IUD for [facility] and weight loss. Also voices concerns about infertility as she was with female partner in past for 7 years and no pregnancy occurred; he now has 3 children with another woman.  She reports onset menarche at age 30 with normal monthly menses, and has used Nuvaring(slipped out), OCPs (couldn't remember to take on time) and condoms in past. Menses have changed in the last year that they occur a few days later and are only spotting.   Reports onset weight gain in middle school with current weight being stable to 2 years. Does eat out a lot, is 'somewhat' active, and does drink a lot of Mt Dew. Is currently sexually active and using condoms, doesn't want children right now, but in the future. Does feel like she has excessive facial hair, and mom reports her being very 'hairy' as a baby and child.  Review of Systems See above    Objective:   Physical Exam A&O x4  well groomed obese female in no distress Height 5\' 2"  (1.575 m), weight 216 lb 1.6 oz (98.022 kg), last menstrual period 12/20/2015. Thyroid normal Pelvic exam deferred Fine blonde hair on neck and chin    Assessment:     Morbid obesity Menstrual changes Contraception counseling      Plan:     Counseled on Kyleena and will schedule insertion with next menses Pelvic u/s and labs ordered around day 20 of up-coming menses. Will consider weight loss plan if all labs are normal  Melody HobokenShambley, CNM

## 2016-01-10 ENCOUNTER — Ambulatory Visit (INDEPENDENT_AMBULATORY_CARE_PROVIDER_SITE_OTHER): Payer: BLUE CROSS/BLUE SHIELD

## 2016-01-10 DIAGNOSIS — N926 Irregular menstruation, unspecified: Secondary | ICD-10-CM

## 2016-01-10 DIAGNOSIS — E669 Obesity, unspecified: Secondary | ICD-10-CM

## 2016-01-10 DIAGNOSIS — Z3040 Encounter for surveillance of contraceptives, unspecified: Secondary | ICD-10-CM

## 2016-01-15 ENCOUNTER — Ambulatory Visit: Payer: BLUE CROSS/BLUE SHIELD | Admitting: Unknown Physician Specialty

## 2016-01-18 ENCOUNTER — Ambulatory Visit (INDEPENDENT_AMBULATORY_CARE_PROVIDER_SITE_OTHER): Payer: BLUE CROSS/BLUE SHIELD | Admitting: Obstetrics and Gynecology

## 2016-01-18 ENCOUNTER — Encounter: Payer: Self-pay | Admitting: Obstetrics and Gynecology

## 2016-01-18 ENCOUNTER — Ambulatory Visit: Payer: BLUE CROSS/BLUE SHIELD | Admitting: Unknown Physician Specialty

## 2016-01-18 VITALS — BP 113/75 | HR 93 | Wt 218.3 lb

## 2016-01-18 DIAGNOSIS — Z30430 Encounter for insertion of intrauterine contraceptive device: Secondary | ICD-10-CM | POA: Diagnosis not present

## 2016-01-18 MED ORDER — PHENTERMINE HCL 37.5 MG PO TABS: 37.5000 mg | ORAL_TABLET | Freq: Every day | ORAL | Status: DC

## 2016-01-18 MED ORDER — CYANOCOBALAMIN 1000 MCG/ML IJ SOLN: 1000.0000 ug | Freq: Once | INTRAMUSCULAR | Status: DC

## 2016-01-18 MED ORDER — LEVONORGESTREL 19.5 MG IU IUD: 1.0000 | INTRAUTERINE_SYSTEM | Freq: Once | INTRAUTERINE | Status: AC

## 2016-01-18 NOTE — Progress Notes (Signed)
Alexis Rangel is a 30 y.o. year old [id number] Caucasian female who presents for placement of a Alexis Rangel & review ultrasound. Also desires to start weight loss medication.  Patient's last menstrual period was 01/15/2016. BP 113/75 mmHg  Pulse 93  Wt 218 lb 4.8 oz (99.02 kg)  LMP 01/15/2016 Last sexual intercourse was last night, and pregnancy test today was negative  The risks and benefits of the method and placement have been thouroughly reviewed with the patient and all questions were answered.  Specifically the patient is aware of failure rate of 09/998, expulsion of the Rangel and of possible perforation.  The patient is aware of irregular bleeding due to the method and understands the incidence of irregular bleeding diminishes with time.  Signed copy of informed consent in chart.   Time out was performed.  A pederson speculum was placed in the vagina.  The cervix was visualized, prepped using Betadine, and grasped with a single tooth tenaculum. The uterus was found to be neutral and it sounded to 8 cm.  Kyleena Rangel placed per manufacturer's recommendations.   The strings were trimmed to 3 cm.  Sonogram was reviewed from 5/16 and all findings were normal.   The patient was given post procedure instructions, including signs and symptoms of infection and to check for the strings after each menses or each month, and refraining from intercourse or anything in the vagina for 3 days.  She was given a [place] care card with date [place] placed, and date Mirena to be removed.  RX for weight loss meds given and B12 injection given. RTC in 4 weeks for nurse visit and in 8 weeks for Rangel check  Melody Suzan NailerN Shambley, CNM

## 2016-01-18 NOTE — Patient Instructions (Signed)
IUD PLACEMENT POST-PROCEDURE INSTRUCTIONS  1. You may take Ibuprofen, Aleve or Tylenol for pain if needed.  Cramping should resolve within in 24 hours.  2. You may have a small amount of spotting.  You should wear a mini pad for the next few days.  3. You may have intercourse after 24 hours.  If you using this for birth control, it is effective immediately.  4. You need to call if you have any pelvic pain, fever, heavy bleeding or foul smelling vaginal discharge.  Irregular bleeding is common the first several months after having an IUD placed. You do not need to call for this reason unless you are concerned.  5. Shower or bathe as normal  6. You should have a follow-up appointment in 8 weeks for a re-check to make sure you are not having any problems.

## 2016-01-21 LAB — TESTOSTERONE, FREE, TOTAL, SHBG
SEX HORMONE BINDING: 24.1 nmol/L — AB (ref 24.6–122.0)
TESTOSTERONE FREE: 4.5 pg/mL — AB (ref 0.0–4.2)
TESTOSTERONE: 41 ng/dL (ref 8–48)

## 2016-01-21 LAB — THYROID PANEL WITH TSH
Free Thyroxine Index: 1.9 (ref 1.2–4.9)
T3 Uptake Ratio: 28 % (ref 24–39)
T4, Total: 6.9 ug/dL (ref 4.5–12.0)
TSH: 2.52 u[IU]/mL (ref 0.450–4.500)

## 2016-01-21 LAB — PROGESTERONE: Progesterone: 0.3 ng/mL

## 2016-01-21 LAB — FSH/LH
FSH: 4.7 m[IU]/mL
LH: 6.9 m[IU]/mL

## 2016-01-21 LAB — ESTRADIOL: ESTRADIOL: 27 pg/mL

## 2016-01-21 LAB — FERRITIN: FERRITIN: 46 ng/mL (ref 15–150)

## 2016-01-21 LAB — DHEA-SULFATE: DHEA SO4: 420.2 ug/dL — AB (ref 84.8–378.0)

## 2016-01-21 LAB — INSULIN, RANDOM: INSULIN: 35.9 u[IU]/mL — ABNORMAL HIGH (ref 2.6–24.9)

## 2016-01-25 ENCOUNTER — Telehealth: Payer: Self-pay | Admitting: Obstetrics and Gynecology

## 2016-01-25 DIAGNOSIS — E282 Polycystic ovarian syndrome: Secondary | ICD-10-CM

## 2016-01-25 NOTE — Telephone Encounter (Signed)
pls advise

## 2016-01-25 NOTE — Telephone Encounter (Signed)
Pt calls and states that she saw lab results on myChart that she has questions about. Please call pt back at [phone] ext. [phone] Thanks

## 2016-01-29 NOTE — Telephone Encounter (Signed)
Please let her know labs look fine except for her insulin levels- which can be consistent with pre-diabetes or PCOS-  And I want her to follow up with an endocrinologist. I can refer her if she doesn't have someone in particular she would want to see. The testoterone and DHEAS and sex binding are all OK even though they are slightly out of norm, not levels we normally treat.

## 2016-01-31 NOTE — Telephone Encounter (Signed)
[facility]oke with pt [facility]

## 2016-01-31 NOTE — Telephone Encounter (Signed)
Pt called back to have her lab results explained to her. She'd like another phone call when possible.  Pt's ph# [phone] (call this number between 12-4) Post 4pm call: [phone] Thank you.

## 2016-02-15 ENCOUNTER — Ambulatory Visit (INDEPENDENT_AMBULATORY_CARE_PROVIDER_SITE_OTHER): Payer: BLUE CROSS/BLUE SHIELD | Admitting: Obstetrics and Gynecology

## 2016-02-15 VITALS — BP 115/76 | HR 82 | Ht 62.0 in | Wt 207.4 lb

## 2016-02-15 DIAGNOSIS — R5382 Chronic fatigue, unspecified: Secondary | ICD-10-CM

## 2016-02-15 DIAGNOSIS — E669 Obesity, unspecified: Secondary | ICD-10-CM

## 2016-02-15 MED ORDER — CYANOCOBALAMIN 1000 MCG/ML IJ SOLN
1000.0000 ug | Freq: Once | INTRAMUSCULAR | Status: AC
  Administered 2016-02-15: 1000 ug via INTRAMUSCULAR

## 2016-02-15 NOTE — Progress Notes (Signed)
Filed Weights   02/15/16 0918  Weight: 207 lb 6.4 oz (94.076 kg)    Pt presents for weight management. Pt today with weight loss of 11lbs. Gave pt verbal praise. States she has not began exercising but walks and lifts a lot at work. Advised pt on healthy eating habits and exercise. Pt denies adverse side effects with medication. Pt to f/u in 1 month. Pt given 1mL injection of B12, which she tolerated well.

## 2016-02-15 NOTE — Patient Instructions (Signed)
Follow up as scheduled.  

## 2016-03-07 ENCOUNTER — Ambulatory Visit: Payer: PRIVATE HEALTH INSURANCE | Admitting: Endocrinology

## 2016-03-11 ENCOUNTER — Ambulatory Visit (INDEPENDENT_AMBULATORY_CARE_PROVIDER_SITE_OTHER): Payer: BLUE CROSS/BLUE SHIELD | Admitting: Endocrinology

## 2016-03-11 ENCOUNTER — Encounter: Payer: Self-pay | Admitting: Endocrinology

## 2016-03-11 VITALS — BP 112/76 | HR 114 | Ht 62.0 in | Wt 203.0 lb

## 2016-03-11 DIAGNOSIS — E282 Polycystic ovarian syndrome: Secondary | ICD-10-CM | POA: Diagnosis not present

## 2016-03-11 DIAGNOSIS — E669 Obesity, unspecified: Secondary | ICD-10-CM | POA: Diagnosis not present

## 2016-03-11 DIAGNOSIS — E161 Other hypoglycemia: Secondary | ICD-10-CM

## 2016-03-11 MED ORDER — METFORMIN HCL ER 500 MG PO TB24: 500.0000 mg | ORAL_TABLET | Freq: Every day | ORAL | Status: DC

## 2016-03-11 MED ORDER — DEXAMETHASONE 1 MG PO TABS: ORAL_TABLET | ORAL | Status: DC

## 2016-03-11 NOTE — Progress Notes (Signed)
Subjective:    Patient ID: Alexis Rangel, female    DOB: 08-Jul-1986, 30 y.o.   MRN: [id number]  HPI Pt had menarche at age 32.  She has always had regular menses. She is G0.  She reports as a child, she developed moderate hair on the face, and assoc weight gain.  She does not want a pregnancy now, but would like to preserve fertility for the future.  She has an IUD (after she tried OC's--could not remember to take).   Past Medical History  Diagnosis Date  . Depression   . Bipolar 1 disorder (HCC)   . PCO (polycystic ovaries)     Past Surgical History  Procedure Laterality Date  . Tonsillectomy and adenoidectomy    . Ccy  06/09/2014    pt states she does not remember this surgery  . Wisdom tooth extraction    . Cholecystectomy      Social History   Social History  . Marital Status: Single    Spouse Name: N/A  . Number of Children: N/A  . Years of Education: N/A   Occupational History  . Not on file.   Social History Main Topics  . Smoking status: Never Smoker   . Smokeless tobacco: Never Used  . Alcohol Use: No  . Drug Use: No  . Sexual Activity: Yes    Birth Control/ Protection: Condom   Other Topics Concern  . Not on file   Social History Narrative    Current Outpatient Prescriptions on File Prior to Visit  Medication Sig Dispense Refill  . cyanocobalamin (,VITAMIN B-12,) 1000 MCG/ML injection Inject 1 mL (1,000 mcg total) into the muscle once. 10 mL 1  . ibuprofen (ADVIL) 200 MG tablet Take 200 mg by mouth every 6 (six) hours as needed.    . Levonorgestrel (KYLEENA) 19.5 MG IUD 1 Device by Intrauterine route once. 1 Intra Uterine Device 0  . phentermine (ADIPEX-P) 37.5 MG tablet Take 1 tablet (37.5 mg total) by mouth daily before breakfast. 30 tablet 2  . amitriptyline (ELAVIL) 25 MG tablet Reported on 03/11/2016  0  . citalopram (CELEXA) 20 MG tablet Take 1 tablet (20 mg total) by mouth daily. (Patient not taking: Reported on 03/11/2016) 30 tablet 1    No current facility-administered medications on file prior to visit.    No Known Allergies  Family History  Problem Relation Age of Onset  . Cancer Mother     cervical  . COPD Mother   . Hyperlipidemia Father   . Hypertension Father   . Migraines Father   . Bipolar disorder Sister   . Heart disease Maternal Grandmother   . COPD Maternal Grandmother   . Heart disease Maternal Grandfather   . Hypertension Paternal Grandmother   . Heart disease Paternal Grandmother   . Hypertension Paternal Grandfather   . Heart disease Paternal Grandfather   . Diabetes Neg Hx     BP 112/76 mmHg  Pulse 114  Ht  (1.575 m)  Wt 203 lb (92.08 kg)  BMI 37.12 kg/m2  SpO2 98%  LMP 03/09/2016    Review of Systems  denies weight gain, blurry vision, hair loss, polyuria, sob, insomnia, hyperpigmentation, cramps, easy bruising, muscle weakness, depression, and rash on the abdomen.  She has intermittent headache, foot numbness, and excessive diaphoresis.  She has recently lost a few lbs, since she has been on adipex.     Objective:   Physical Exam VS: see vs page GEN: no  distress HEAD: head: no deformity.  Moderate terminal hair on the face and chin eyes: no periorbital swelling, no proptosis external nose and ears are normal mouth: no lesion seen NECK: supple, thyroid is not enlarged CHEST WALL: no deformity LUNGS: clear to auscultation CV: reg rate and rhythm, no murmur ABD: abdomen is soft, nontender.  no hepatosplenomegaly.  not distended.  no hernia.  No striae.  MUSCULOSKELETAL: muscle bulk and strength are grossly normal.  no obvious joint swelling.  gait is normal and steady EXTEMITIES: no deformity.  no edema PULSES: no carotid bruit.  NEURO:  cn 2-12 grossly intact.   readily moves all 4's.  sensation is intact to touch on all 4's.  SKIN:  Normal texture and temperature.  No rash or suspicious lesion is visible.   NODES:  None palpable at the neck PSYCH: alert,  well-oriented.  Does not appear anxious nor depressed.   Lab Results  Component Value Date   TESTOSTERONE 41 01/18/2016   Lab Results  Component Value Date   TSH 2.520 01/18/2016   T4TOTAL 6.9 01/18/2016   Insulin=36  DHEA-SO4=420  US: no cysts are noted  I have reviewed outside records, and summarized:  Pt was noted to have abnormal results.  IUD was placed.      Assessment & Plan:  PCO: despite reg menses, this is most likely dx.  Hyperinsulinemia: she is at risk for DM.  In view of this, metformin should be initial rx  Patient is advised the following: Patient Instructions  You should do a "dexamethasone suppression test."  For this, you would take dexamethasone 1 mg at 10 pm (I have sent a prescription to your pharmacy), then come in for a "cortisol" blood test the next morning before 9 am.  You do not need to be fasting for this test.   I have sent a prescription to your pharmacy, to add "metformin."  Please come back for a follow-up appointment in 2-3 months.  We can then add other meds (dexamethasone or spironolactone) as necessary.     Romero BellingELLISON, Olie Dibert, MD

## 2016-03-11 NOTE — Patient Instructions (Addendum)
You should do a "dexamethasone suppression test."  For this, you would take dexamethasone 1 mg at 10 pm (I have sent a prescription to your pharmacy), then come in for a "cortisol" blood test the next morning before 9 am.  You do not need to be fasting for this test.   I have sent a prescription to your pharmacy, to add "metformin."  Please come back for a follow-up appointment in 2-3 months.  We can then add other meds (dexamethasone or spironolactone) as necessary.

## 2016-03-18 ENCOUNTER — Ambulatory Visit: Payer: BLUE CROSS/BLUE SHIELD

## 2016-03-18 ENCOUNTER — Ambulatory Visit (INDEPENDENT_AMBULATORY_CARE_PROVIDER_SITE_OTHER): Payer: BLUE CROSS/BLUE SHIELD | Admitting: Obstetrics and Gynecology

## 2016-03-18 ENCOUNTER — Encounter: Payer: Self-pay | Admitting: Obstetrics and Gynecology

## 2016-03-18 ENCOUNTER — Other Ambulatory Visit: Payer: Self-pay | Admitting: Obstetrics and Gynecology

## 2016-03-18 VITALS — BP 119/81 | HR 116 | Ht 62.0 in | Wt 204.0 lb

## 2016-03-18 DIAGNOSIS — Z01419 Encounter for gynecological examination (general) (routine) without abnormal findings: Secondary | ICD-10-CM | POA: Diagnosis not present

## 2016-03-18 NOTE — Patient Instructions (Signed)
Place annual gynecologic exam patient instructions here.

## 2016-03-18 NOTE — Progress Notes (Signed)
  Subjective:     Alexis Rangel is a 30 y.o. female and is here for a comprehensive physical exam. The patient reports no problems.  Social History   Social History  . Marital Status: Single    Spouse Name: N/A  . Number of Children: N/A  . Years of Education: N/A   Occupational History  . Not on file.   Social History Main Topics  . Smoking status: Never Smoker   . Smokeless tobacco: Never Used  . Alcohol Use: No  . Drug Use: No  . Sexual Activity: Yes    Birth Control/ Protection: IUD     Comment: kyleena   Other Topics Concern  . Not on file   Social History Narrative   Health Maintenance  Topic Date Due  . HIV Screening  06/17/2002  . PAP SMEAR  06/17/2008  . INFLUENZA VACCINE  04/01/2016  . TETANUS/TDAP  07/17/2024    The following portions of the patient's history were reviewed and updated as appropriate: allergies, current medications, past family history, past medical history, past social history, past surgical history and problem list.  Review of Systems A comprehensive review of systems was negative.   Objective:    General appearance: alert, cooperative, appears stated age and moderately obese Neck: no adenopathy, no carotid bruit, no JVD, supple, symmetrical, trachea midline and thyroid not enlarged, symmetric, no tenderness/mass/nodules Lungs: clear to auscultation bilaterally Breasts: normal appearance, no masses or tenderness Heart: regular rate and rhythm, S1, S2 normal, no murmur, click, rub or gallop Abdomen: soft, non-tender; bowel sounds normal; no masses,  no organomegaly Pelvic: cervix normal in appearance, external genitalia normal, no adnexal masses or tenderness, no cervical motion tenderness, rectovaginal septum normal, uterus normal size, shape, and consistency, vagina normal without discharge and IUD string noted    Assessment:    Healthy female exam. Obesity, PCOS, IUD check      Plan:  Will repeat labs that PCP wanted next  month, to continue on weight loss plan,    See After Visit Summary for Counseling Recommendations

## 2016-03-19 LAB — CYTOLOGY - PAP

## 2016-04-01 ENCOUNTER — Telehealth: Payer: Self-pay | Admitting: Obstetrics and Gynecology

## 2016-04-01 NOTE — Telephone Encounter (Signed)
Pt called and she had some questions about the test results and would like a call back.

## 2016-04-01 NOTE — Telephone Encounter (Signed)
Spoke with pt

## 2016-04-15 ENCOUNTER — Other Ambulatory Visit: Payer: Self-pay | Admitting: Endocrinology

## 2016-04-15 ENCOUNTER — Ambulatory Visit (INDEPENDENT_AMBULATORY_CARE_PROVIDER_SITE_OTHER): Payer: BLUE CROSS/BLUE SHIELD | Admitting: Obstetrics and Gynecology

## 2016-04-15 ENCOUNTER — Other Ambulatory Visit: Payer: BLUE CROSS/BLUE SHIELD

## 2016-04-15 VITALS — BP 114/82 | HR 83 | Ht 62.0 in | Wt 202.9 lb

## 2016-04-15 DIAGNOSIS — E669 Obesity, unspecified: Secondary | ICD-10-CM

## 2016-04-15 DIAGNOSIS — E282 Polycystic ovarian syndrome: Secondary | ICD-10-CM

## 2016-04-15 DIAGNOSIS — E161 Other hypoglycemia: Secondary | ICD-10-CM

## 2016-04-15 MED ORDER — CYANOCOBALAMIN 1000 MCG/ML IJ SOLN
1000.0000 ug | Freq: Once | INTRAMUSCULAR | Status: AC
  Administered 2016-04-15: 1000 ug via INTRAMUSCULAR

## 2016-04-15 NOTE — Progress Notes (Signed)
Patient ID: Alexis Rangel, female   DOB: April 25, 1986, 30 y.o.   MRN: [id number] Pt presents for weight, B/P, B-12 injection. No side effects of medication-Phentermine, or B-12.  Weight loss of  2 lbs. Encouraged eating healthy and exercise. Pt has not had phentermine refilled as of yet due to finances. Has been off x 2 wks. Pt can tell that she wants to eat.

## 2016-04-16 LAB — PTH, INTACT AND CALCIUM: Calcium: 9 mg/dL (ref 8.7–10.2)

## 2016-04-16 LAB — CORTISOL: Cortisol: 0.4 ug/dL

## 2016-04-16 LAB — HGB A1C W/O EAG: Hgb A1c MFr Bld: 5.7 % — ABNORMAL HIGH (ref 4.8–5.6)

## 2016-04-16 LAB — VITAMIN D 25 HYDROXY (VIT D DEFICIENCY, FRACTURES): Vit D, 25-Hydroxy: 30.5 ng/mL (ref 30.0–100.0)

## 2016-05-16 ENCOUNTER — Ambulatory Visit: Payer: BLUE CROSS/BLUE SHIELD

## 2016-05-20 ENCOUNTER — Ambulatory Visit (INDEPENDENT_AMBULATORY_CARE_PROVIDER_SITE_OTHER): Payer: BLUE CROSS/BLUE SHIELD | Admitting: Obstetrics and Gynecology

## 2016-05-20 ENCOUNTER — Ambulatory Visit (INDEPENDENT_AMBULATORY_CARE_PROVIDER_SITE_OTHER): Payer: BLUE CROSS/BLUE SHIELD

## 2016-05-20 DIAGNOSIS — R102 Pelvic and perineal pain: Secondary | ICD-10-CM

## 2016-05-20 MED ORDER — PHENTERMINE HCL 37.5 MG PO TABS: 37.5000 mg | ORAL_TABLET | Freq: Every day | ORAL | 2 refills | Status: DC

## 2016-05-20 NOTE — Progress Notes (Signed)
Subjective:     Patient ID: Alexis Rangel, female   DOB: 1986-05-13, 30 y.o.   MRN: [id number]  HPI Reports lower pelvic pain x 1 month after sex, sharp pains into vaginal. Also noted with BM or voiding. Has hd similar pains in past due to ovarian cyst. States same sexual partner. Denies postcoital bleeding or fever or changes in vaginal discharge.  Review of Systems  Negative except stated above in HPI     Objective:   Physical Exam A&O x4 Well groomed female in no distress Blood pressure 115/70, pulse 88, height 5\' 2"  (1.575 m), weight 200 lb 4.8 oz (90.9 kg). Urinalysis    Component Value Date/Time   COLORURINE Straw 06/01/2014 1037   APPEARANCEUR Clear 06/01/2014 1037   LABSPEC 1.010 06/01/2014 1037   PHURINE 6.0 06/01/2014 1037   GLUCOSEU Negative 06/01/2014 1037   HGBUR Negative 06/01/2014 1037   BILIRUBINUR Negative 06/01/2014 1037   KETONESUR Negative 06/01/2014 1037   PROTEINUR Negative 06/01/2014 1037   NITRITE Negative 06/01/2014 1037   LEUKOCYTESUR Negative 06/01/2014 1037    Abdomen soft and not tender on exam, BSx4 Pelvic exam: normal external genitalia, vulva, vagina, cervix, uterus and adnexa, ADNEXA: tenderness right, no masses.     Assessment:     Pelvic pain of unknown etiology Obesity     Plan:     Discussed different but common causes of pelvic pain, including UTI, bowel changes, ovarian cyst, & STI. Pelvic ultrasound ordered and will follow up accordingly. Melody MaranaShambley, CNM

## 2016-05-22 ENCOUNTER — Ambulatory Visit: Payer: BLUE CROSS/BLUE SHIELD

## 2016-06-11 ENCOUNTER — Ambulatory Visit: Payer: BLUE CROSS/BLUE SHIELD | Admitting: Endocrinology

## 2016-08-21 ENCOUNTER — Ambulatory Visit (INDEPENDENT_AMBULATORY_CARE_PROVIDER_SITE_OTHER): Payer: BLUE CROSS/BLUE SHIELD | Admitting: Family Medicine

## 2016-08-21 ENCOUNTER — Encounter: Payer: Self-pay | Admitting: Family Medicine

## 2016-08-21 VITALS — BP 134/78 | HR 86 | Temp 97.7°F | Wt 203.6 lb

## 2016-08-21 DIAGNOSIS — G43009 Migraine without aura, not intractable, without status migrainosus: Secondary | ICD-10-CM | POA: Diagnosis not present

## 2016-08-21 MED ORDER — ONDANSETRON 4 MG PO TBDP: 4.0000 mg | ORAL_TABLET | Freq: Three times a day (TID) | ORAL | 0 refills | Status: DC | PRN

## 2016-08-21 MED ORDER — SUMATRIPTAN SUCCINATE 50 MG PO TABS: 50.0000 mg | ORAL_TABLET | ORAL | 3 refills | Status: DC | PRN

## 2016-08-21 MED ORDER — AMITRIPTYLINE HCL 25 MG PO TABS: 25.0000 mg | ORAL_TABLET | Freq: Every day | ORAL | 6 refills | Status: DC

## 2016-08-21 MED ORDER — CYCLOBENZAPRINE HCL 10 MG PO TABS: 10.0000 mg | ORAL_TABLET | Freq: Three times a day (TID) | ORAL | 1 refills | Status: DC | PRN

## 2016-08-21 MED ORDER — SUMATRIPTAN SUCCINATE 6 MG/0.5ML ~~LOC~~ SOLN
6.0000 mg | Freq: Once | SUBCUTANEOUS | Status: AC
  Administered 2016-08-21: 6 mg via SUBCUTANEOUS

## 2016-08-21 NOTE — Patient Instructions (Signed)
Follow up as needed

## 2016-08-21 NOTE — Assessment & Plan Note (Addendum)
Refilled amitriptyline, patient to restart it long term. Discussed use of imitrex once feeling a migraine come on. Zofran for as needed use for migraine. Also suspect some tension component, so flexeril prn for muscle tension/stress aspect. Discussed massage, muscle rub, epsom salt soaks.  Cautioned about sedation risk with flexeril.

## 2016-08-21 NOTE — Progress Notes (Signed)
BP 134/78 (BP Location: Left Arm, Patient Position: Sitting, Cuff Size: Large)   Pulse 86   Temp 97.7 F (36.5 C)   Wt 203 lb 9.6 oz (92.4 kg)   SpO2 98%   BMI 37.24 kg/m    Subjective:    Patient ID: Alexis Rangel, female    DOB: [birth year], 30 y.o.   MRN: [id number]  HPI: Alexis Rangel is a 30 y.o. female  Chief Complaint  Patient presents with  . Migraine    pt states she has had a headache/migraine for 2 to 3 days. states tylenol has not helped at all and that she needs a work note for today    Patient presents with 2-3 day history of b/l temporal headache that extends down into neck on both sides. Some photophobia and nausea. States she has been under a lot of stress, and still recovering from a bad stomach bug last week. Feels like her muscles are really tense in her neck and shoulders. Has had a long hx of migraines, took amitryptiline for a while with good results but ran out and hasn't gotten it refilled. Would like to start back on it. Taking tylenol and ibuprofen with some relief. Denies fever, chills, vision changes.   Relevant past medical, surgical, family and social history reviewed and updated as indicated. Interim medical history since our last visit reviewed. Allergies and medications reviewed and updated.  Review of Systems  Constitutional: Negative.   HENT: Negative.   Eyes: Positive for photophobia.  Respiratory: Negative.   Cardiovascular: Negative.   Gastrointestinal: Positive for nausea.  Genitourinary: Negative.   Musculoskeletal: Positive for neck pain.  Skin: Negative.   Neurological: Positive for headaches.  Psychiatric/Behavioral: Negative.     Per HPI unless specifically indicated above     Objective:    BP 134/78 (BP Location: Left Arm, Patient Position: Sitting, Cuff Size: Large)   Pulse 86   Temp 97.7 F (36.5 C)   Wt 203 lb 9.6 oz (92.4 kg)   SpO2 98%   BMI 37.24 kg/m   Wt Readings from Last 3 Encounters:  08/21/16  203 lb 9.6 oz (92.4 kg)  05/20/16 200 lb 4.8 oz (90.9 kg)  04/15/16 202 lb 14.4 oz (92 kg)    Physical Exam  Constitutional: She is oriented to person, place, and time. She appears well-developed and well-nourished.  HENT:  Head: Atraumatic.  Eyes: Conjunctivae and EOM are normal. Pupils are equal, round, and reactive to light. No scleral icterus.  Neck: Normal range of motion. Neck supple.  B/l trapezius muscles TTP  Cardiovascular: Normal rate, regular rhythm and normal heart sounds.   Pulmonary/Chest: Effort normal and breath sounds normal.  Musculoskeletal: Normal range of motion.  Neurological: She is alert and oriented to person, place, and time. No cranial nerve deficit.  Skin: Skin is warm and dry.  Psychiatric: She has a normal mood and affect. Her behavior is normal.  Nursing note and vitals reviewed.     Assessment & Plan:   Problem List Items Addressed This Visit      Cardiovascular and Mediastinum   Migraine - Primary    Refilled amitriptyline, patient to restart it long term. Discussed use of imitrex once feeling a migraine come on. Zofran for as needed use for migraine. Also suspect some tension component, so flexeril prn for muscle tension/stress aspect. Discussed massage, muscle rub, epsom salt soaks.  Cautioned about sedation risk with flexeril.       Relevant  Medications   amitriptyline (ELAVIL) 25 MG tablet   SUMAtriptan (IMITREX) 50 MG tablet   cyclobenzaprine (FLEXERIL) 10 MG tablet   SUMAtriptan (IMITREX) injection 6 mg (Completed)       Follow up plan: Return if symptoms worsen or fail to improve.

## 2016-10-07 ENCOUNTER — Encounter: Payer: Self-pay | Admitting: Unknown Physician Specialty

## 2016-10-07 ENCOUNTER — Ambulatory Visit (INDEPENDENT_AMBULATORY_CARE_PROVIDER_SITE_OTHER): Payer: BLUE CROSS/BLUE SHIELD | Admitting: Unknown Physician Specialty

## 2016-10-07 VITALS — BP 126/76 | HR 99 | Temp 98.1°F | Wt 207.2 lb

## 2016-10-07 DIAGNOSIS — K529 Noninfective gastroenteritis and colitis, unspecified: Secondary | ICD-10-CM | POA: Diagnosis not present

## 2016-10-07 LAB — CBC WITH DIFFERENTIAL/PLATELET
HEMOGLOBIN: 14.3 g/dL (ref 11.1–15.9)
Hematocrit: 42.9 % (ref 34.0–46.6)
LYMPHS: 28 %
Lymphocytes Absolute: 2.5 10*3/uL (ref 0.7–3.1)
MCH: 28.7 pg (ref 26.6–33.0)
MCHC: 33.3 g/dL (ref 31.5–35.7)
MCV: 86 fL (ref 79–97)
MID (Absolute): 0.4 10*3/uL (ref 0.1–1.6)
MID: 5 %
NEUTROS ABS: 6.2 10*3/uL (ref 1.4–7.0)
NEUTROS PCT: 68 %
PLATELETS: 434 10*3/uL — AB (ref 150–379)
RBC: 4.99 x10E6/uL (ref 3.77–5.28)
RDW: 14.6 % (ref 12.3–15.4)
WBC: 9.1 10*3/uL (ref 3.4–10.8)

## 2016-10-07 NOTE — Progress Notes (Signed)
BP 126/76 (BP Location: Right Arm, Patient Position: Sitting, Cuff Size: Large)   Pulse 99   Temp 98.1 F (36.7 C)   Wt 207 lb 3.2 oz (94 kg)   SpO2 98%   BMI 37.90 kg/m    Subjective:    Patient ID: Alexis Rangel, female    DOB: 1986-01-26, 31 y.o.   MRN: [id number]  HPI: Alexis Rangel is a 31 y.o. female  Chief Complaint  Patient presents with  . GI Problem    pt states she has had an upset stomach since Sunday, states she threw up Sunday evening and has had diarrhea since Sunday as well    Gastrointestinal Upset Patient presents to clinic with gastrointestinal upset x 2 days. States she woke up late Sunday evening feeling nauseous, followed by an episode of both vomiting and diarrhea. Reports she proceded to vomit 6 or 7 times throughout the day yesterday along with 10-20 incidences of diarrhea. Last vomited late last night, however she notes that she is still feeling nauseous and her diarrhea has worsened. States that he has been able to eat and drink, but has not been able to keep much in for long. Denies any fever, cough, congestion, light-headedness or headaches. Admits to some fatigue, chills and diaphoreses as well as a bit of a sore throat from vomiting. Boyfriend's son, whom she lives with, is experiencing similar symptoms, which began around the same time. Both ate pizza together on Sunday evening, however her boyfriend also ate pizza and is not sick.  Relevant past medical, surgical, family and social history reviewed and updated as indicated. Interim medical history since our last visit reviewed. Allergies and medications reviewed and updated.  Review of Systems  Constitutional: Positive for chills, diaphoresis and fatigue. Negative for appetite change and fever.  HENT: Positive for sore throat. Negative for congestion and ear pain.   Respiratory: Negative for cough and chest tightness.   Gastrointestinal: Positive for abdominal pain, diarrhea, nausea and  vomiting. Negative for constipation.  Genitourinary: Negative for difficulty urinating, dysuria and urgency.  Musculoskeletal: Negative for myalgias.  Skin: Negative.   Neurological: Negative for facial asymmetry, light-headedness and headaches.    Per HPI unless specifically indicated above     Objective:    BP 126/76 (BP Location: Right Arm, Patient Position: Sitting, Cuff Size: Large)   Pulse 99   Temp 98.1 F (36.7 C)   Wt 207 lb 3.2 oz (94 kg)   SpO2 98%   BMI 37.90 kg/m   Wt Readings from Last 3 Encounters:  10/07/16 207 lb 3.2 oz (94 kg)  08/21/16 203 lb 9.6 oz (92.4 kg)  05/20/16 200 lb 4.8 oz (90.9 kg)    Physical Exam  Constitutional: She is oriented to person, place, and time. She appears well-developed and well-nourished. No distress.  HENT:  Head: Normocephalic and atraumatic.  Mouth/Throat: Mucous membranes are dry.  Eyes: Conjunctivae are normal. Right eye exhibits no discharge. Left eye exhibits no discharge. No scleral icterus.  Neck: Neck supple.  Cardiovascular: Normal rate, regular rhythm and normal heart sounds.   Pulmonary/Chest: Effort normal and breath sounds normal. No respiratory distress.  Abdominal: Soft. Normal appearance and bowel sounds are normal. She exhibits no distension and no mass. There is generalized tenderness. There is no rebound, no guarding and no CVA tenderness.  Neurological: She is alert and oriented to person, place, and time.  Skin: Skin is warm and dry. She is not diaphoretic.  Psychiatric: She  has a normal mood and affect. Her behavior is normal. Judgment and thought content normal.  Nursing note and vitals reviewed.   Results for orders placed or performed in visit on 04/15/16  PTH, intact and calcium  Result Value Ref Range   Calcium 9.0 8.7 - 10.2 mg/dL   PTH CANCELED pg/mL   PTH Comment   Cortisol  Result Value Ref Range   Cortisol 0.4 ug/dL  Hgb Z6X w/o eAG  Result Value Ref Range   Hgb A1c MFr Bld 5.7 (H) 4.8  - 5.6 %  VITAMIN D 25 Hydroxy (Vit-D Deficiency, Fractures)  Result Value Ref Range   Vit D, 25-Hydroxy 30.5 30.0 - 100.0 ng/mL      Assessment & Plan:   Problem List Items Addressed This Visit    None    Visit Diagnoses    Gastroenteritis    -  Primary   WBC normal. Encouraged rest and plenty of clear liquids to remain hydrated. Recommended resting gut as much as possible and following BRAT diet when eating.   Relevant Orders   CBC With Differential/Platelet       Follow up plan: Return if symptoms worsen or fail to improve.

## 2016-11-25 ENCOUNTER — Emergency Department
Admission: EM | Admit: 2016-11-25 | Discharge: 2016-11-25 | Disposition: A | Discharge status: Home / Self Care | Payer: BLUE CROSS/BLUE SHIELD | Attending: Emergency Medicine | Admitting: Emergency Medicine

## 2016-11-25 ENCOUNTER — Emergency Department: Payer: BLUE CROSS/BLUE SHIELD

## 2016-11-25 DIAGNOSIS — Y929 Unspecified place or not applicable: Secondary | ICD-10-CM | POA: Insufficient documentation

## 2016-11-25 DIAGNOSIS — S61412A Laceration without foreign body of left hand, initial encounter: Secondary | ICD-10-CM | POA: Insufficient documentation

## 2016-11-25 DIAGNOSIS — Y939 Activity, unspecified: Secondary | ICD-10-CM | POA: Insufficient documentation

## 2016-11-25 DIAGNOSIS — Z7984 Long term (current) use of oral hypoglycemic drugs: Secondary | ICD-10-CM | POA: Diagnosis not present

## 2016-11-25 DIAGNOSIS — Y999 Unspecified external cause status: Secondary | ICD-10-CM | POA: Diagnosis not present

## 2016-11-25 DIAGNOSIS — Z23 Encounter for immunization: Secondary | ICD-10-CM | POA: Insufficient documentation

## 2016-11-25 DIAGNOSIS — W450XXA Nail entering through skin, initial encounter: Secondary | ICD-10-CM | POA: Diagnosis not present

## 2016-11-25 MED ORDER — LIDOCAINE HCL (PF) 1 % IJ SOLN
INTRAMUSCULAR | Status: AC
  Administered 2016-11-25: 5 mL
  Filled 2016-11-25: qty 5

## 2016-11-25 MED ORDER — LIDOCAINE HCL 1 % IJ SOLN
5.0000 mL | Freq: Once | INTRAMUSCULAR | Status: AC
  Administered 2016-11-25: 5 mL
  Filled 2016-11-25: qty 5

## 2016-11-25 MED ORDER — TETANUS-DIPHTH-ACELL PERTUSSIS 5-2.5-18.5 LF-MCG/0.5 IM SUSP
0.5000 mL | Freq: Once | INTRAMUSCULAR | Status: AC
  Administered 2016-11-25: 0.5 mL via INTRAMUSCULAR
  Filled 2016-11-25: qty 0.5

## 2016-11-25 MED ORDER — CEPHALEXIN 500 MG PO CAPS: 500.0000 mg | ORAL_CAPSULE | Freq: Four times a day (QID) | ORAL | 0 refills | Status: AC

## 2016-11-25 NOTE — ED Triage Notes (Signed)
Pt hit L hand on nail on porch. Small C shaped lac to L hand below fingers 4 and 5. Pt able to move fingers. States painful to move. States less sensation like tingling feeling in 5th finger.

## 2016-11-25 NOTE — ED Provider Notes (Signed)
[facility] Emergency Department Provider Note  ____________________________________________  Time seen: Approximately 6:55 PM  I have reviewed the triage vital signs and the nursing notes.   HISTORY  Chief Complaint Laceration    HPI Alexis Rangel is a 31 y.o. female presenting to the emergency department with a 1 cm laceration of the left palm. Patient states that she sustained laceration after hitting her left palm against a nail on a porch earlier this evening. Patient cannot recall her last tetanus shot. Patient is right-handed. Patient denies weakness and radiculopathy. No alleviating measures have been attempted besides the application of a clean dressing.   Past Medical History:  Diagnosis Date  . Bipolar 1 disorder (HCC)   . Depression   . PCO (polycystic ovaries)     Patient Active Problem List   Diagnosis Date Noted  . Pelvic pain in female 05/20/2016  . Hypocalcemia 03/11/2016  . Hyperinsulinemia 03/11/2016  . PCO (polycystic ovaries)   . Family planning 12/18/2015  . Depression 12/18/2015  . Obesity 10/17/2015  . Migraine 10/17/2015  . Cholelithiasis 10/16/2015    Past Surgical History:  Procedure Laterality Date  . ccy  06/09/2014   pt states she does not remember this surgery  . CHOLECYSTECTOMY    . TONSILLECTOMY AND ADENOIDECTOMY    . WISDOM TOOTH EXTRACTION      Prior to Admission medications   Medication Sig Start Date End Date Taking? Authorizing Provider  amitriptyline (ELAVIL) 25 MG tablet Take 1 tablet (25 mg total) by mouth at bedtime. Reported on 03/18/2016 08/21/16   Particia Nearing, PA-C  cephALEXin (KEFLEX) 500 MG capsule Take 1 capsule (500 mg total) by mouth 4 (four) times daily. 11/25/16 12/05/16  [name], PA-C  cyanocobalamin (,VITAMIN B-12,) 1000 MCG/ML injection Inject 1 mL (1,000 mcg total) into the muscle once. 01/18/16   [name], CNM  cyclobenzaprine (FLEXERIL) 10 MG tablet Take 1  tablet (10 mg total) by mouth 3 (three) times daily as needed for muscle spasms. 08/21/16   Particia Nearing, PA-C  dexamethasone (DECADRON) 1 MG tablet Take at 9-10 pm, the night before blood test 03/11/16   [name], MD  ibuprofen (ADVIL) 200 MG tablet Take 200 mg by mouth every 6 (six) hours as needed.    Historical Provider, MD  Levonorgestrel (KYLEENA) 19.5 MG IUD 1 Device by Intrauterine route once. 01/18/16   [name], CNM  metFORMIN (GLUCOPHAGE-XR) 500 MG 24 hr tablet Take 1 tablet (500 mg total) by mouth daily with breakfast. 03/11/16   [name], MD  ondansetron (ZOFRAN ODT) 4 MG disintegrating tablet Take 1 tablet (4 mg total) by mouth every 8 (eight) hours as needed for nausea or vomiting. 08/21/16   Particia Nearing, PA-C  phentermine (ADIPEX-P) 37.5 MG tablet Take 1 tablet (37.5 mg total) by mouth daily before breakfast. 05/20/16   [name], CNM  SUMAtriptan (IMITREX) 50 MG tablet Take 1 tablet (50 mg total) by mouth every 2 (two) hours as needed for migraine. May repeat in 2 hours if headache persists or recurs. 08/21/16   Particia Nearing, PA-C    Allergies Patient has no known allergies.  Family History  Problem Relation Age of Onset  . Cancer Mother     cervical  . COPD Mother   . Hyperlipidemia Father   . Hypertension Father   . Migraines Father   . Bipolar disorder Sister   . Heart disease Maternal Grandmother   .  COPD Maternal Grandmother   . Heart disease Maternal Grandfather   . Hypertension Paternal Grandmother   . Heart disease Paternal Grandmother   . Hypertension Paternal Grandfather   . Heart disease Paternal Grandfather   . Alzheimer's disease Paternal Grandfather   . Diabetes Neg Hx     Social History Social History  Substance Use Topics  . Smoking status: Never Smoker  . Smokeless tobacco: Never Used  . Alcohol use No     Review of Systems  Constitutional: No fever/chills Eyes: No visual changes. No  discharge ENT: No upper respiratory complaints. Cardiovascular: no chest pain. Respiratory: no cough. No SOB. Gastrointestinal: No abdominal pain.  No nausea, no vomiting.  No diarrhea.  No constipation. Musculoskeletal: Negative for musculoskeletal pain. Skin: Patient has laceration.  Neurological: Negative for headaches, focal weakness or numbness.   ____________________________________________   PHYSICAL EXAM:  VITAL SIGNS: ED Triage Vitals  Enc Vitals Group     BP      Pulse      Resp      Temp      Temp src      SpO2      Weight      Height      Head Circumference      Peak Flow      Pain Score      Pain Loc      Pain Edu?      Excl. in GC?      Constitutional: Alert and oriented. Well appearing and in no acute distress. Eyes: Conjunctivae are normal. PERRL. EOMI. Head: Atraumatic. Cardiovascular: Normal rate, regular rhythm. Normal S1 and S2.  Good peripheral circulation. Respiratory: Normal respiratory effort without tachypnea or retractions. Lungs CTAB. Good air entry to the bases with no decreased or absent breath sounds. Musculoskeletal: Full range of motion to all extremities. No gross deformities appreciated. Neurologic:  Normal speech and language. No gross focal neurologic deficits are appreciated.  Skin: Patient has a 1 cm semilunar laceration of the skin overlying the left palm. Skin edges are without significant maceration. Psychiatric: Mood and affect are normal. Speech and behavior are normal. Patient exhibits appropriate insight and judgement.   ____________________________________________   LABS (all labs ordered are listed, but only abnormal results are displayed)  Labs Reviewed - No data to display ____________________________________________  EKG   ____________________________________________  RADIOLOGY Geraldo Pitter, personally viewed and evaluated these images (plain radiographs) as part of my medical decision making, as well  as reviewing the written report by the radiologist.  Dg Hand Complete Left  Result Date: 11/25/2016 CLINICAL DATA:  Laceration from nail, possible fracture or foreign body EXAM: LEFT HAND - COMPLETE 3+ VIEW COMPARISON:  None. FINDINGS: Three views of the left hand submitted. No acute fracture or subluxation. No radiopaque foreign body. IMPRESSION: Negative. Electronically Signed   By: Natasha Mead M.D.   On: 11/25/2016 20:41     ____________________________________________    PROCEDURES  Procedure(s) performed:    Procedures    Medications  Tdap (BOOSTRIX) injection 0.5 mL (0.5 mLs Intramuscular Given 11/25/16 2032)  lidocaine (XYLOCAINE) 1 % (with pres) injection 5 mL (5 mLs Infiltration Given 11/25/16 2109)   LACERATION REPAIR Performed by: [name] Authorized by: [name] Consent: Verbal consent obtained. Risks and benefits: risks, benefits and alternatives were discussed Consent given by: patient Patient identity confirmed: provided demographic data Prepped and Draped in normal sterile fashion Wound explored  Laceration Location: Left palm  Laceration Length: 1 cm  No Foreign Bodies seen or palpated  Anesthesia: local infiltration  Local anesthetic: lidocaine 1% without epinephrine  Anesthetic total: 3 ml  Irrigation method: syringe Amount of cleaning: standard  Skin closure: 4-0 Ethilon   Number of sutures: 3  Technique: Simple Interrupted   Patient tolerance: Patient tolerated the procedure well with no immediate complications.   ____________________________________________   INITIAL IMPRESSION / ASSESSMENT AND PLAN / ED COURSE  Pertinent labs & imaging results that were available during my care of the patient were reviewed by me and considered in my medical decision making (see chart for details).  Review of the [place] CSRS was performed in accordance of the [facility] prior to dispensing any controlled drugs.     Assessment and  plan: Laceration Repair  Patient presents to the emergency department with a 1 cm laceration of the skin overlying the left palm. DG left hand reveals no acute fractures or foreign bodies. Patient's tetanus status was updated in the emergency department. Patient underwent laceration repair without complications. She was discharged with Keflex. Patient was advised to have her primary care provider remove sutures in 9 days. All patient questions were answered. Vital signs are reassuring at this time.    ____________________________________________  FINAL CLINICAL IMPRESSION(S) / ED DIAGNOSES  Final diagnoses:  Laceration of left hand without foreign body, initial encounter      NEW MEDICATIONS STARTED DURING THIS VISIT:  Discharge Medication List as of 11/25/2016  9:35 PM    START taking these medications   Details  cephALEXin (KEFLEX) 500 MG capsule Take 1 capsule (500 mg total) by mouth 4 (four) times daily., Starting Tue 11/25/2016, Until Fri 12/05/2016, Print            This chart was dictated using voice recognition software/Dragon. Despite best efforts to proofread, errors can occur which can change the meaning. Any change was purely unintentional.    Orvil FeilJaclyn M Woods, PA-C 11/25/16 2352    Myrna Blazeravid Matthew Schaevitz, MD 11/25/16 [phone]

## 2016-12-09 ENCOUNTER — Encounter: Payer: Self-pay | Admitting: Obstetrics and Gynecology

## 2016-12-12 ENCOUNTER — Ambulatory Visit (INDEPENDENT_AMBULATORY_CARE_PROVIDER_SITE_OTHER): Payer: BLUE CROSS/BLUE SHIELD | Admitting: Family Medicine

## 2016-12-12 ENCOUNTER — Encounter: Payer: Self-pay | Admitting: Family Medicine

## 2016-12-12 VITALS — BP 121/79 | HR 90 | Temp 98.8°F | Wt 214.4 lb

## 2016-12-12 DIAGNOSIS — Z7189 Other specified counseling: Secondary | ICD-10-CM

## 2016-12-12 DIAGNOSIS — Z7185 Encounter for immunization safety counseling: Secondary | ICD-10-CM

## 2016-12-12 NOTE — Progress Notes (Signed)
BP 121/79 (BP Location: Right Arm, Patient Position: Sitting, Cuff Size: Large)   Pulse 90   Temp 98.8 F (37.1 C)   Wt 214 lb 6.4 oz (97.3 kg)   LMP 12/12/2016 (Exact Date)   SpO2 100%   BMI 39.21 kg/m    Subjective:    Patient ID: Alexis Rangel, female    DOB: Sep 07, 1985, 31 y.o.   MRN: [id number]  HPI: Alexis Rangel is a 31 y.o. female  Chief Complaint  Patient presents with  . Paperwork   Patient presents for medical form to be filled out regarding a Tdap and plasma donation. Tdap given in ER after hand laceration 3/27. Form needs to state that "hypertet" is not an ingredient in the vaccine she was given, which was Boostrix.   Relevant past medical, surgical, family and social history reviewed and updated as indicated. Interim medical history since our last visit reviewed. Allergies and medications reviewed and updated.  Review of Systems  Constitutional: Negative.   HENT: Negative.   Respiratory: Negative.   Cardiovascular: Negative.   Gastrointestinal: Negative.   Genitourinary: Negative.   Musculoskeletal: Negative.   Skin: Positive for wound.  Neurological: Negative.   Psychiatric/Behavioral: Negative.     Per HPI unless specifically indicated above     Objective:    BP 121/79 (BP Location: Right Arm, Patient Position: Sitting, Cuff Size: Large)   Pulse 90   Temp 98.8 F (37.1 C)   Wt 214 lb 6.4 oz (97.3 kg)   LMP 12/12/2016 (Exact Date)   SpO2 100%   BMI 39.21 kg/m   Wt Readings from Last 3 Encounters:  12/12/16 214 lb 6.4 oz (97.3 kg)  11/25/16 215 lb (97.5 kg)  10/07/16 207 lb 3.2 oz (94 kg)    Physical Exam  Constitutional: She is oriented to person, place, and time. She appears well-developed and well-nourished. No distress.  HENT:  Head: Atraumatic.  Eyes: Conjunctivae are normal. Pupils are equal, round, and reactive to light.  Neck: Normal range of motion. Neck supple.  Cardiovascular: Normal rate and normal heart sounds.     Pulmonary/Chest: Effort normal. No respiratory distress.  Neurological: She is alert and oriented to person, place, and time.  Skin: Skin is warm and dry.  Well healing laceration of right hand  Psychiatric: She has a normal mood and affect. Her behavior is normal.  Nursing note and vitals reviewed.   Results for orders placed or performed in visit on 10/07/16  CBC With Differential/Platelet  Result Value Ref Range   WBC 9.1 3.4 - 10.8 x10E3/uL   RBC 4.99 3.77 - 5.28 x10E6/uL   Hemoglobin 14.3 11.1 - 15.9 g/dL   Hematocrit 40.9 81.1 - 46.6 %   MCV 86 79 - 97 fL   MCH 28.7 26.6 - 33.0 pg   MCHC 33.3 31.5 - 35.7 g/dL   RDW 91.4 78.2 - 95.6 %   Platelets 434 (H) 150 - 379 x10E3/uL   Neutrophils 68 Not Estab. %   Lymphs 28 Not Estab. %   MID 5 Not Estab. %   Neutrophils Absolute 6.2 1.4 - 7.0 x10E3/uL   Lymphocytes Absolute 2.5 0.7 - 3.1 x10E3/uL   MID (Absolute) 0.4 0.1 - 1.6 X10E3/uL      Assessment & Plan:   Problem List Items Addressed This Visit    None    Visit Diagnoses    Encounter for counseling regarding immunization    -  Primary   Paperwork filled  out verifying that Boostrix does not include the ingredient "hypertet". Form copied, faxed, and original given back to patient       Follow up plan: Return if symptoms worsen or fail to improve.

## 2016-12-12 NOTE — Patient Instructions (Signed)
Follow up as needed

## 2017-01-15 ENCOUNTER — Encounter: Payer: Self-pay | Admitting: Family Medicine

## 2017-01-15 ENCOUNTER — Ambulatory Visit (INDEPENDENT_AMBULATORY_CARE_PROVIDER_SITE_OTHER): Payer: BLUE CROSS/BLUE SHIELD | Admitting: Family Medicine

## 2017-01-15 VITALS — BP 104/72 | HR 83 | Temp 98.6°F | Ht 62.0 in | Wt 215.6 lb

## 2017-01-15 DIAGNOSIS — G43009 Migraine without aura, not intractable, without status migrainosus: Secondary | ICD-10-CM

## 2017-01-15 MED ORDER — RIZATRIPTAN BENZOATE 10 MG PO TABS: 10.0000 mg | ORAL_TABLET | ORAL | 0 refills | Status: DC | PRN

## 2017-01-15 MED ORDER — KETOROLAC TROMETHAMINE 60 MG/2ML IM SOLN
60.0000 mg | Freq: Once | INTRAMUSCULAR | Status: AC
  Administered 2017-01-15: 60 mg via INTRAMUSCULAR

## 2017-01-15 NOTE — Progress Notes (Signed)
BP 104/72 (BP Location: Right Arm, Patient Position: Sitting, Cuff Size: Large)   Pulse 83   Temp 98.6 F (37 C)   Ht 5\' 2"  (1.575 m)   Wt 215 lb 9.6 oz (97.8 kg)   SpO2 99%   BMI 39.43 kg/m    Subjective:    Patient ID: Alexis Rangel, female    DOB: [birth year], 31 y.o.   MRN: [id number]  HPI: Alexis Rangel is a 31 y.o. female  Chief Complaint  Patient presents with  . Migraine    x's 4 days. Hurts in back of patient's neck. Been taking Tylenol Extra Strength. Patient states it lasts for about 1 hour.    Patient presents with persistent migraine x 4 days. Gets about an hour relief with extra strength tylenol or ibuprofen, but no lasting relief. Last dose was last night. Denies photophobia, some nausea but no vomiting. Was seen for this in December and was to go back on daily amitriptyline and flexeril prn but has not been able to afford these medications so has not taken them. Has had several migraines in the interim but none severe where tylenol could not take care of them until now.   Relevant past medical, surgical, family and social history reviewed and updated as indicated. Interim medical history since our last visit reviewed. Allergies and medications reviewed and updated.  Review of Systems  Constitutional: Negative.   HENT: Negative.   Eyes: Negative for photophobia and visual disturbance.  Respiratory: Negative.   Cardiovascular: Negative.   Gastrointestinal: Positive for nausea.  Genitourinary: Negative.   Musculoskeletal: Negative.   Neurological: Positive for headaches.  Psychiatric/Behavioral: Negative.     Per HPI unless specifically indicated above     Objective:    BP 104/72 (BP Location: Right Arm, Patient Position: Sitting, Cuff Size: Large)   Pulse 83   Temp 98.6 F (37 C)   Ht 5\' 2"  (1.575 m)   Wt 215 lb 9.6 oz (97.8 kg)   SpO2 99%   BMI 39.43 kg/m   Wt Readings from Last 3 Encounters:  01/15/17 215 lb 9.6 oz (97.8 kg)    12/12/16 214 lb 6.4 oz (97.3 kg)  11/25/16 215 lb (97.5 kg)    Physical Exam  Constitutional: She is oriented to person, place, and time. She appears well-developed and well-nourished. No distress.  HENT:  Head: Atraumatic.  Eyes: Conjunctivae and EOM are normal. Pupils are equal, round, and reactive to light. No scleral icterus.  Neck: Normal range of motion. Neck supple.  Cardiovascular: Normal rate and normal heart sounds.   Pulmonary/Chest: Effort normal and breath sounds normal. No respiratory distress.  Abdominal: Soft. Bowel sounds are normal. There is no tenderness.  Musculoskeletal: Normal range of motion.  TTP over b/l trapezius muscles  Neurological: She is alert and oriented to person, place, and time. No cranial nerve deficit.  Skin: Skin is warm and dry.  Psychiatric: She has a normal mood and affect. Her behavior is normal.  Nursing note and vitals reviewed.     Assessment & Plan:   Problem List Items Addressed This Visit      Cardiovascular and Mediastinum   Migraine - Primary    Toradol IM given in office. Discussed only taking tylenol the rest of the day. Coupon given for maxalt tablets, discussed how to use them for migraine abortion. Continue OTC pain relievers as needed. Add flexeril prn for muscle tension as there is likely a tension HA component at  play. Discussed massage, relaxation, stress management. Watch for dietary triggers. Will go back on amitriptyline daily if migraines become more frequent or uncontrollable.       Relevant Medications   rizatriptan (MAXALT) 10 MG tablet   ketorolac (TORADOL) injection 60 mg (Completed)       Follow up plan: Return if symptoms worsen or fail to improve.

## 2017-01-15 NOTE — Patient Instructions (Signed)
Follow up if no improvement 

## 2017-01-15 NOTE — Assessment & Plan Note (Addendum)
Toradol IM given in office. Discussed only taking tylenol the rest of the day. Coupon given for maxalt tablets, discussed how to use them for migraine abortion. Continue OTC pain relievers as needed. Add flexeril prn for muscle tension as there is likely a tension HA component at play. Discussed massage, relaxation, stress management. Watch for dietary triggers. Will go back on amitriptyline daily if migraines become more frequent or uncontrollable.

## 2017-01-19 ENCOUNTER — Telehealth: Payer: Self-pay | Admitting: Family Medicine

## 2017-01-19 NOTE — Telephone Encounter (Signed)
Patient notified, letter printed.  

## 2017-01-19 NOTE — Telephone Encounter (Signed)
Pt called and would like to get a note to extend her work note to cover her for Friday.

## 2017-02-20 ENCOUNTER — Encounter: Payer: Self-pay | Admitting: Obstetrics and Gynecology

## 2017-02-20 ENCOUNTER — Ambulatory Visit (INDEPENDENT_AMBULATORY_CARE_PROVIDER_SITE_OTHER): Payer: BLUE CROSS/BLUE SHIELD | Admitting: Obstetrics and Gynecology

## 2017-02-20 MED ORDER — PHENTERMINE HCL 37.5 MG PO TABS: 37.5000 mg | ORAL_TABLET | Freq: Every day | ORAL | 2 refills | Status: DC

## 2017-02-20 MED ORDER — CYANOCOBALAMIN 1000 MCG/ML IJ SOLN: 1000.0000 ug | INTRAMUSCULAR | 1 refills | Status: DC

## 2017-02-20 NOTE — Progress Notes (Signed)
Subjective:  Alexis Rangel is a 31 y.o. G0P0000 at Unknown being seen today for weight loss management- initial visit.  Patient reports General ROS: negative and reports desires to restart weight loss medication. Is already exercising regular and increased water intake. Previous/Current treatment includes: small frequent feedings, nutritional supplement,vitamin B-12 injections and appetite stimulant.   The following portions of the patient's history were reviewed and updated as appropriate: allergies, current medications, past family history, past medical history, past social history, past surgical history and problem list.   Objective:   Vitals:   02/20/17 0941  BP: 124/84  Pulse: 86  Weight: 219 lb 14.4 oz (99.7 kg)  Height: 5\' 2"  (1.575 m)    General:  Alert, oriented and cooperative. Patient is in no acute distress.  :   :   :   :   :   :   PE: Well groomed female in no current distress,   Mental Status: Normal mood and affect. Normal behavior. Normal judgment and thought content.   Current BMI: Body mass index is 40.22 kg/m.   Assessment and Plan:  Obesity  There are no diagnoses linked to this encounter.  Plan: low carb, High protein diet RX for adipex 37.5 mg daily and B12 1000mcg.ml monthly, to start now with first injection given at today's visit. Reviewed side-effects common to both medications and expected outcomes. Increase daily water intake to at least 8 bottle a day, every day.  Goal is to reduse weight by 10% by end of three months, and will re-evaluate then.  RTC in 4 weeks for Nurse visit to check weight & BP, and get next B12 injections.    Please refer to After Visit Summary for other counseling recommendations.    Macks CreekShambley, Jamyla Ard N, CNM   Leshawn Straka FloydN Jelene Albano, CNM      Consider the Low Glycemic Index Diet and 6 smaller meals daily .  This boosts your metabolism and regulates your sugars:   Use the protein bar by [employer]kins because they  have lots of fiber in them  Find the low carb flatbreads, tortillas and pita breads for sandwiches:  [name] makes a pita bread and a flat bread , available at [facility] [employer] and [employer]; [name] makes a low carb flatbread available at [employer] and [employer] that is 9 net carbs and 100 cal Mission makes a low carb whole wheat tortilla available at [employer],and most grocery stores with 6 net carbs and 210 cal  [place] yogurt can still have a lot of carbs .  [name] Light N fit has 80 cal and 8 carbs

## 2017-02-20 NOTE — Patient Instructions (Signed)

## 2017-03-11 ENCOUNTER — Ambulatory Visit (INDEPENDENT_AMBULATORY_CARE_PROVIDER_SITE_OTHER): Payer: BLUE CROSS/BLUE SHIELD | Admitting: Family Medicine

## 2017-03-11 ENCOUNTER — Encounter: Payer: Self-pay | Admitting: Family Medicine

## 2017-03-11 VITALS — BP 106/75 | HR 96 | Temp 99.1°F | Wt 217.0 lb

## 2017-03-11 DIAGNOSIS — R600 Localized edema: Secondary | ICD-10-CM | POA: Diagnosis not present

## 2017-03-11 NOTE — Progress Notes (Signed)
   BP 106/75   Pulse 96   Temp 99.1 F (37.3 C)   Wt 217 lb (98.4 kg)   SpO2 99%   BMI 39.69 kg/m    Subjective:    Patient ID: Alexis Rangel, female    DOB: [birth year], 31 y.o.   MRN: [id number]  HPI: Alexis Rangel is a 31 y.o. female  Chief Complaint  Patient presents with  . Leg Pain    right leg started swelling before father's day and now it's getting worse and bruising. Lately feels like something is stabbing her in the leg. Swelling does go down if she stays off her feet. No known injury.    Patient presents today for over a month of right upper calf pain, swelling, and mild bruising. Denies any injury, CP, SOB, palpitations. States the pain is worsening the past few days. Has not been trying anything OTC for sxs. Her mother told her it could be a blood clot and that she should get things checked out. Mother states both grandparents had hx of DVTs. Denies recent surgery, prolonged travel, sedentary lifestyle, smoking hx, bleeding disorders.   Relevant past medical, surgical, family and social history reviewed and updated as indicated. Interim medical history since our last visit reviewed. Allergies and medications reviewed and updated.  Review of Systems  Constitutional: Negative.   Respiratory: Negative.   Cardiovascular: Negative.   Gastrointestinal: Negative.   Genitourinary: Negative.   Musculoskeletal: Negative.   Neurological: Negative.   Psychiatric/Behavioral: Negative.    Per HPI unless specifically indicated above     Objective:    BP 106/75   Pulse 96   Temp 99.1 F (37.3 C)   Wt 217 lb (98.4 kg)   SpO2 99%   BMI 39.69 kg/m   Wt Readings from Last 3 Encounters:  03/11/17 217 lb (98.4 kg)  02/20/17 219 lb 14.4 oz (99.7 kg)  01/15/17 215 lb 9.6 oz (97.8 kg)    Physical Exam  Constitutional: She is oriented to person, place, and time. She appears well-developed and well-nourished. No distress.  HENT:  Head: Atraumatic.  Left Ear:  External ear normal.  Eyes: Pupils are equal, round, and reactive to light. Conjunctivae are normal. No scleral icterus.  Neck: Normal range of motion. Neck supple.  Cardiovascular: Normal rate and normal heart sounds.   Pulmonary/Chest: Effort normal and breath sounds normal. No respiratory distress.  Musculoskeletal: Normal range of motion. She exhibits edema (Edema of upper right calf with some darkening of area) and tenderness (Tender to pressure over edematous area).  - homans sign  Neurological: She is alert and oriented to person, place, and time.  Skin: Skin is warm and dry.  Psychiatric: She has a normal mood and affect. Her behavior is normal.  Nursing note and vitals reviewed.     Assessment & Plan:   Problem List Items Addressed This Visit    None    Visit Diagnoses    Leg edema, right    -  Primary   Urgent DVT r/o U/S RLE ordered, vascular surgery referral placed for if abnormal. ASA, compression, elevation in meantime. Return precautions revewed at length   Relevant Orders   Ambulatory referral to Vascular Surgery   VAS US LOWER EXTREMITY VENOUS (DVT)       Follow up plan: Return if symptoms worsen or fail to improve.

## 2017-03-13 NOTE — Patient Instructions (Signed)
Go to ER immediately for chest pain, SOB, or racing heart.

## 2017-03-16 ENCOUNTER — Ambulatory Visit (INDEPENDENT_AMBULATORY_CARE_PROVIDER_SITE_OTHER): Payer: BLUE CROSS/BLUE SHIELD | Admitting: Family Medicine

## 2017-03-16 ENCOUNTER — Encounter (INDEPENDENT_AMBULATORY_CARE_PROVIDER_SITE_OTHER): Payer: Self-pay | Admitting: Vascular Surgery

## 2017-03-16 ENCOUNTER — Encounter: Payer: Self-pay | Admitting: Family Medicine

## 2017-03-16 ENCOUNTER — Ambulatory Visit (INDEPENDENT_AMBULATORY_CARE_PROVIDER_SITE_OTHER): Payer: BLUE CROSS/BLUE SHIELD

## 2017-03-16 VITALS — BP 118/79 | HR 85 | Temp 98.8°F | Wt 217.0 lb

## 2017-03-16 DIAGNOSIS — G43009 Migraine without aura, not intractable, without status migrainosus: Secondary | ICD-10-CM

## 2017-03-16 DIAGNOSIS — R600 Localized edema: Secondary | ICD-10-CM | POA: Diagnosis not present

## 2017-03-16 MED ORDER — RIZATRIPTAN BENZOATE 10 MG PO TABS: 10.0000 mg | ORAL_TABLET | ORAL | 0 refills | Status: DC | PRN

## 2017-03-16 MED ORDER — KETOROLAC TROMETHAMINE 60 MG/2ML IM SOLN
60.0000 mg | Freq: Once | INTRAMUSCULAR | Status: AC
  Administered 2017-03-16: 60 mg via INTRAMUSCULAR

## 2017-03-16 NOTE — Assessment & Plan Note (Signed)
IM toradol given today. FMLA paperwork completed. Maxalt refill with coupon given. F/u if not improving over next day or so.

## 2017-03-16 NOTE — Progress Notes (Signed)
   BP 118/79   Pulse 85   Temp 98.8 F (37.1 C)   Wt 217 lb (98.4 kg)   BMI 39.69 kg/m    Subjective:    Patient ID: Alexis Rangel, female    DOB: [birth year], 31 y.o.   MRN: [id number]  HPI: Alexis Rangel is a 31 y.o. female  Chief Complaint  Patient presents with  . FMLA Paperwork    needs paperwork completed due to her migraines.  . Migraine    She needs a refill on Maxalt. Has a migraine currently.    Patient presents today with FMLA paperwork for her migraines. Currently taking amitriptyline daily and maxalt prn. Still getting occasional migraine episodes that keep her from performing her job functions. Having one today and is out of her maxalt. States she gets pain from base of skull wrapping around forehead, nausea, dizziness, blurry vision with her migraines. Denies syncope, CP, palpitations.   Relevant past medical, surgical, family and social history reviewed and updated as indicated. Interim medical history since our last visit reviewed. Allergies and medications reviewed and updated.  Review of Systems  Constitutional: Negative.   HENT: Negative.   Eyes: Negative.   Respiratory: Negative.   Cardiovascular: Negative.   Gastrointestinal: Positive for nausea.  Genitourinary: Negative.   Musculoskeletal: Negative.   Neurological: Positive for dizziness and headaches.  Psychiatric/Behavioral: Negative.    Per HPI unless specifically indicated above     Objective:    BP 118/79   Pulse 85   Temp 98.8 F (37.1 C)   Wt 217 lb (98.4 kg)   BMI 39.69 kg/m   Wt Readings from Last 3 Encounters:  03/16/17 217 lb (98.4 kg)  03/11/17 217 lb (98.4 kg)  02/20/17 219 lb 14.4 oz (99.7 kg)    Physical Exam  Constitutional: She is oriented to person, place, and time. She appears well-developed and well-nourished. No distress.  HENT:  Head: Atraumatic.  Eyes: Pupils are equal, round, and reactive to light. Conjunctivae are normal.  Neck: Normal range of  motion. Neck supple.  Cardiovascular: Normal rate and normal heart sounds.   Pulmonary/Chest: Effort normal and breath sounds normal. No respiratory distress.  Musculoskeletal: Normal range of motion.  Neurological: She is alert and oriented to person, place, and time. No cranial nerve deficit.  Skin: Skin is warm and dry.  Psychiatric: She has a normal mood and affect. Her behavior is normal.  Nursing note and vitals reviewed.     Assessment & Plan:   Problem List Items Addressed This Visit      Cardiovascular and Mediastinum   Migraine - Primary    IM toradol given today. FMLA paperwork completed. Maxalt refill with coupon given. F/u if not improving over next day or so.       Relevant Medications   rizatriptan (MAXALT) 10 MG tablet   ketorolac (TORADOL) injection 60 mg (Completed)      Follow up plan: Return if symptoms worsen or fail to improve.

## 2017-03-17 ENCOUNTER — Encounter: Payer: Self-pay | Admitting: Family Medicine

## 2017-03-19 ENCOUNTER — Encounter: Payer: BLUE CROSS/BLUE SHIELD | Admitting: Obstetrics and Gynecology

## 2017-03-19 NOTE — Telephone Encounter (Signed)
Patient stopped by to drop off form for work needing to be redone due to recent restriction.  Letter was given to patient. Provider aware of paperwork needing to redone and update of why paperwork needed to be filled out. Paperwork handed to provider. Patient will be back this afternoon to pick back up paperwork.

## 2017-03-19 NOTE — Telephone Encounter (Signed)
Signed paperwork placed at front office.

## 2017-04-03 ENCOUNTER — Encounter: Payer: BLUE CROSS/BLUE SHIELD | Admitting: Obstetrics and Gynecology

## 2017-04-10 ENCOUNTER — Emergency Department: Payer: BLUE CROSS/BLUE SHIELD

## 2017-04-10 ENCOUNTER — Encounter: Payer: Self-pay | Admitting: Emergency Medicine

## 2017-04-10 ENCOUNTER — Emergency Department
Admission: EM | Admit: 2017-04-10 | Discharge: 2017-04-10 | Disposition: A | Discharge status: Home / Self Care | Payer: BLUE CROSS/BLUE SHIELD | Attending: Emergency Medicine | Admitting: Emergency Medicine

## 2017-04-10 DIAGNOSIS — Z79899 Other long term (current) drug therapy: Secondary | ICD-10-CM | POA: Diagnosis not present

## 2017-04-10 DIAGNOSIS — R51 Headache: Secondary | ICD-10-CM | POA: Diagnosis present

## 2017-04-10 DIAGNOSIS — G43009 Migraine without aura, not intractable, without status migrainosus: Secondary | ICD-10-CM

## 2017-04-10 LAB — BASIC METABOLIC PANEL
Anion gap: 6 (ref 5–15)
BUN: 8 mg/dL (ref 6–20)
CHLORIDE: 109 mmol/L (ref 101–111)
CO2: 26 mmol/L (ref 22–32)
Calcium: 8.3 mg/dL — ABNORMAL LOW (ref 8.9–10.3)
Creatinine, Ser: 0.73 mg/dL (ref 0.44–1.00)
GFR calc non Af Amer: >60 mL/min (ref >60)
Glucose, Bld: 89 mg/dL (ref 65–99)
POTASSIUM: 3.8 mmol/L (ref 3.5–5.1)
SODIUM: 141 mmol/L (ref 135–145)

## 2017-04-10 LAB — CBC
HEMATOCRIT: 44.7 % (ref 35.0–47.0)
Hemoglobin: 14.6 g/dL (ref 12.0–16.0)
MCH: 27.3 pg (ref 26.0–34.0)
MCHC: 32.6 g/dL (ref 32.0–36.0)
MCV: 84 fL (ref 80.0–100.0)
Platelets: 387 10*3/uL (ref 150–440)
RBC: 5.33 MIL/uL — AB (ref 3.80–5.20)
RDW: 14.5 % (ref 11.5–14.5)
WBC: 11 10*3/uL (ref 3.6–11.0)

## 2017-04-10 LAB — TROPONIN I: Troponin I: <0.03 ng/mL (ref <0.03)

## 2017-04-10 MED ORDER — DIPHENHYDRAMINE HCL 50 MG/ML IJ SOLN
25.0000 mg | Freq: Once | INTRAMUSCULAR | Status: AC
  Administered 2017-04-10: 25 mg via INTRAVENOUS
  Filled 2017-04-10: qty 1

## 2017-04-10 MED ORDER — KETOROLAC TROMETHAMINE 30 MG/ML IJ SOLN
30.0000 mg | Freq: Once | INTRAMUSCULAR | Status: AC
  Administered 2017-04-10: 30 mg via INTRAVENOUS
  Filled 2017-04-10: qty 1

## 2017-04-10 MED ORDER — BUTALBITAL-APAP-CAFFEINE 50-325-40 MG PO TABS: 1.0000 | ORAL_TABLET | Freq: Four times a day (QID) | ORAL | 0 refills | Status: DC | PRN

## 2017-04-10 MED ORDER — SODIUM CHLORIDE 0.9 % IV SOLN
1000.0000 mL | Freq: Once | INTRAVENOUS | Status: AC
  Administered 2017-04-10: 1000 mL via INTRAVENOUS

## 2017-04-10 MED ORDER — METOCLOPRAMIDE HCL 5 MG/ML IJ SOLN
20.0000 mg | Freq: Once | INTRAVENOUS | Status: DC
  Filled 2017-04-10: qty 4

## 2017-04-10 NOTE — ED Provider Notes (Signed)
[facility] Emergency Department Provider Note   ____________________________________________    I have reviewed the triage vital signs and the nursing notes.   HISTORY  Chief Complaint Headache     HPI Alexis Rangel is a 31 y.o. female who presents with complaints of migraine headache. Patient reports a long history of migraine headaches and reports she developed a typical migraine last night, typically her migraines extending from her posterior head towards her forehead and she becomes very sensitive to the light. She reports this continues today. She also notes that she has had chest pain for greater than 1 week. [facility] and is also followed up with cardiology, has echo scheduled. No significant change in her symptoms. Reports she took 3 Tylenol last night and a muscle relaxer without any improvement in her migraine   Past Medical History:  Diagnosis Date  . Bipolar 1 disorder (HCC)   . Depression   . PCO (polycystic ovaries)     Patient Active Problem List   Diagnosis Date Noted  . Pelvic pain in female 05/20/2016  . Hypocalcemia 03/11/2016  . Hyperinsulinemia 03/11/2016  . PCO (polycystic ovaries)   . Family planning 12/18/2015  . Depression 12/18/2015  . Obesity 10/17/2015  . Migraine 10/17/2015  . Cholelithiasis 10/16/2015    Past Surgical History:  Procedure Laterality Date  . ccy  06/09/2014   pt states she does not remember this surgery  . CHOLECYSTECTOMY    . TONSILLECTOMY AND ADENOIDECTOMY    . WISDOM TOOTH EXTRACTION      Prior to Admission medications   Medication Sig Start Date End Date Taking? Authorizing Provider  cyclobenzaprine (FLEXERIL) 10 MG tablet Take 10 mg by mouth daily as needed.  01/15/17  Yes [provider]  butalbital-acetaminophen-caffeine (FIORICET, ESGIC) [id number] MG tablet Take 1-2 tablets by mouth every 6 (six) hours as needed for headache. 04/10/17 04/10/18   Jene EveryKinner, Kennedie Pardoe, MD  cyanocobalamin (,VITAMIN B-12,) 1000 MCG/ML injection Inject 1 mL (1,000 mcg total) into the muscle every 30 (thirty) days. Patient not taking: Reported on 04/10/2017 02/20/17   Purcell NailsShambley, Melody N, CNM  Levonorgestrel (KYLEENA) 19.5 MG IUD 1 Device by Intrauterine route once. 01/18/16   Shambley, Melody N, CNM  phentermine (ADIPEX-P) 37.5 MG tablet Take 1 tablet (37.5 mg total) by mouth daily before breakfast. Patient not taking: Reported on 04/10/2017 02/20/17   Shambley, Melody N, CNM  rizatriptan (MAXALT) 10 MG tablet Take 1 tablet (10 mg total) by mouth as needed for migraine. May repeat in 2 hours if needed 03/16/17   Particia NearingLane, Rachel Elizabeth, PA-C     Allergies Patient has no known allergies.  Family History  Problem Relation Age of Onset  . Cancer Mother        cervical  . COPD Mother   . Hyperlipidemia Father   . Hypertension Father   . Migraines Father   . Bipolar disorder Sister   . Heart disease Maternal Grandmother   . COPD Maternal Grandmother   . Heart disease Maternal Grandfather   . Hypertension Paternal Grandmother   . Heart disease Paternal Grandmother   . Hypertension Paternal Grandfather   . Heart disease Paternal Grandfather   . Alzheimer's disease Paternal Grandfather   . Diabetes Neg Hx     Social History Social History  Substance Use Topics  . Smoking status: Never Smoker  . Smokeless tobacco: Never Used  . Alcohol use No  Review of Systems  Constitutional: No fever/chills Eyes: No visual changes.  ENT: No Neck pain Cardiovascular: As above Respiratory: Denies shortness of breath. Gastrointestinal: No abdominal pain.  No nausea, no vomiting.   Genitourinary: Negative for dysuria. Musculoskeletal: Negative for back pain. Skin: Negative for rash. Neurological: Positive for headache as above   ____________________________________________   PHYSICAL EXAM:  VITAL SIGNS: ED Triage Vitals [04/10/17 0910]  Enc Vitals Group      BP 136/88     Pulse Rate (!) 106     Resp 18     Temp 98.2 F (36.8 C)     Temp Source Oral     SpO2 100 %     Weight 97.5 kg (215 lb)     Height 1.575 m (5\' 2" )     Head Circumference      Peak Flow      Pain Score 9     Pain Loc      Pain Edu?      Excl. in GC?     Constitutional: Alert and oriented. No acute distress.  Eyes: Conjunctivae are normal.  Head: Atraumatic. Nose: No congestion/rhinnorhea. Mouth/Throat: Mucous membranes are moist.   Neck:  Painless ROM, No vertebral tenderness to palpation Cardiovascular: Normal rate, regular rhythm. Grossly normal heart sounds.  Good peripheral circulation. Respiratory: Normal respiratory effort.  No retractions. Lungs CTAB. Gastrointestinal: Soft and nontender. No distention.  No CVA tenderness. Genitourinary: deferred Musculoskeletal: No lower extremity tenderness nor edema.  Warm and well perfused Neurologic:  Normal speech and language. No gross focal neurologic deficits are appreciated.  Skin:  Skin is warm, dry and intact. No rash noted. Psychiatric: Mood and affect are normal. Speech and behavior are normal.  ____________________________________________   LABS (all labs ordered are listed, but only abnormal results are displayed)  Labs Reviewed  BASIC METABOLIC PANEL - Abnormal; Notable for the following:       Result Value   Calcium 8.3 (*)    All other components within normal limits  CBC - Abnormal; Notable for the following:    RBC 5.33 (*)    All other components within normal limits  TROPONIN I   ____________________________________________  EKG  ED ECG REPORT I, Jene Every, the attending physician, personally viewed and interpreted this ECG.  Date: 04/10/2017  Rate: 112 Rhythm: Sinus tachycardia QRS Axis: normal Intervals: normal ST/T Wave abnormalities: normal   ____________________________________________  RADIOLOGY  Chest x-ray  unremarkable ____________________________________________   PROCEDURES  Procedure(s) performed: No    Critical Care performed:No ____________________________________________   INITIAL IMPRESSION / ASSESSMENT AND PLAN / ED COURSE  Pertinent labs & imaging results that were available during my care of the patient were reviewed by me and considered in my medical decision making (see chart for details).  Patient presented primarily for migraine headache which developed last night. No improvement after Tylenol. We will give IV Reglan and Toradol and Benadryl and fluids. Patient had negative pregnancy test prior medical records. EKG is unremarkable, shows sinus tachycardia, this is consistent with EKG at [facility] and cardiologist.  Patient felt significantly better after fluids Toradol and Benadryl, even before getting Reglan. She reported she was ready to go. Given the improvement in her symptoms and reassuring workup further for discharge with outpatient follow-up.    ____________________________________________   FINAL CLINICAL IMPRESSION(S) / ED DIAGNOSES  Final diagnoses:  Migraine without aura and without status migrainosus, not intractable      NEW MEDICATIONS STARTED DURING THIS VISIT:  Discharge Medication List as of 04/10/2017 12:07 PM    START taking these medications   Details  butalbital-acetaminophen-caffeine (FIORICET, ESGIC) 50-325-40 MG tablet Take 1-2 tablets by mouth every 6 (six) hours as needed for headache., Starting Fri 04/10/2017, Until Sat 04/10/2018, Print         Note:  This document was prepared using Dragon voice recognition software and may include unintentional dictation errors.    Jene Every, MD 04/10/17 1524

## 2017-04-10 NOTE — ED Triage Notes (Signed)
Pt to ed with c/ headache since yesterday rates 9/10.  Pt also reports continuous chest pain that has been going on for several weeks, [facility] and by cardiologist for same.

## 2017-04-21 ENCOUNTER — Ambulatory Visit (INDEPENDENT_AMBULATORY_CARE_PROVIDER_SITE_OTHER): Payer: BLUE CROSS/BLUE SHIELD | Admitting: Family Medicine

## 2017-04-21 ENCOUNTER — Encounter: Payer: Self-pay | Admitting: Family Medicine

## 2017-04-21 VITALS — BP 117/81 | HR 85 | Temp 98.8°F | Wt 218.4 lb

## 2017-04-21 DIAGNOSIS — G43109 Migraine with aura, not intractable, without status migrainosus: Secondary | ICD-10-CM | POA: Diagnosis not present

## 2017-04-21 DIAGNOSIS — R29818 Other symptoms and signs involving the nervous system: Secondary | ICD-10-CM | POA: Diagnosis not present

## 2017-04-21 DIAGNOSIS — H539 Unspecified visual disturbance: Secondary | ICD-10-CM | POA: Diagnosis not present

## 2017-04-21 DIAGNOSIS — F331 Major depressive disorder, recurrent, moderate: Secondary | ICD-10-CM

## 2017-04-21 DIAGNOSIS — G4489 Other headache syndrome: Secondary | ICD-10-CM | POA: Diagnosis not present

## 2017-04-21 MED ORDER — KETOROLAC TROMETHAMINE 60 MG/2ML IM SOLN
30.0000 mg | Freq: Once | INTRAMUSCULAR | Status: AC
  Administered 2017-04-21: 30 mg via INTRAMUSCULAR

## 2017-04-21 MED ORDER — DULOXETINE HCL 30 MG PO CPEP: 30.0000 mg | ORAL_CAPSULE | Freq: Every day | ORAL | 2 refills | Status: DC

## 2017-04-21 NOTE — Assessment & Plan Note (Signed)
Not under good control. Will start cymbalta. Recheck 1 month. Call with any concerns.

## 2017-04-21 NOTE — Assessment & Plan Note (Signed)
Now with aura and blurred vision. Continue amitriptyline. Will add cymbalta to help with mood. Toradol shot given today for acute migraine. Will obtain MRI given change in migraines. Ordered today. Follow up 1 month.

## 2017-04-21 NOTE — Progress Notes (Signed)
BP 117/81 (BP Location: Left Arm, Patient Position: Sitting, Cuff Size: Large)   Pulse 85   Temp 98.8 F (37.1 C)   Wt 218 lb 7 oz (99.1 kg)   SpO2 98%   BMI 39.95 kg/m    Subjective:    Patient ID: Alexis Rangel, female    DOB: 1985/12/06, 31 y.o.   MRN: [id number]  HPI: PATRECIA Rangel is a 31 y.o. female  Chief Complaint  Patient presents with  . Migraine   MIGRAINES- has started the amitriptyline, but not the maxalt Duration: chronic Onset: sudden Severity: severe Quality: sharp and throbbing Frequency: constant Location: starts at the back of her head and then goes up covers her whole head Headache duration: several days Radiation: yes Time of day headache occurs:  Alleviating factors: extra-strength tylenol, sleeping Aggravating factors: lifting or being stressed Headache status at time of visit: current headache Treatments attempted: Treatments attempted: rest, APAP, ibuprofen, aleve", excedrine and amitriptyline   Aura: yes- blurred during the headaches Nausea:  yes Vomiting: no Photophobia:  yes Phonophobia:  yes Effect on social functioning:  yes Numbers of missed days of school/work each month: 3-4x a month Confusion:  yes Gait disturbance/ataxia:  yes Behavioral changes:  yes Fevers:  no  DEPRESSION Mood status: uncontrolled Satisfied with current treatment?: no Symptom severity: moderate  Duration of current treatment : Not on anything Psychotherapy/counseling: no  Previous psychiatric medications: amitriptyline for migraines Depressed mood: yes Anxious mood: yes Anhedonia: no Significant weight loss or gain: no Insomnia: no  Fatigue: yes Feelings of worthlessness or guilt: yes Impaired concentration/indecisiveness: yes Suicidal ideations: no Hopelessness: yes Crying spells: yes Depression screen [facility] 2/9 04/21/2017 12/18/2015  Decreased Interest 2 1  Down, Depressed, Hopeless 3 2  PHQ - 2 Score 5 3  Altered sleeping 1 1    Tired, decreased energy 2 2  Change in appetite 3 3  Feeling bad or failure about yourself  2 2  Trouble concentrating 2 2  Moving slowly or fidgety/restless 1 2  Suicidal thoughts 1 0  PHQ-9 Score 17 15     Relevant past medical, surgical, family and social history reviewed and updated as indicated. Interim medical history since our last visit reviewed. Allergies and medications reviewed and updated.  Review of Systems  Constitutional: Negative.   Respiratory: Negative.  Negative for apnea, cough, choking, chest tightness, shortness of breath, wheezing and stridor.   Cardiovascular: Positive for chest pain. Negative for leg swelling.  Neurological: Positive for headaches. Negative for dizziness, tremors, seizures, syncope, facial asymmetry, speech difficulty, weakness, light-headedness and numbness.  Psychiatric/Behavioral: Positive for dysphoric mood. Negative for agitation, behavioral problems, confusion, decreased concentration, hallucinations, self-injury, sleep disturbance and suicidal ideas. The patient is not nervous/anxious and is not hyperactive.     Per HPI unless specifically indicated above     Objective:    BP 117/81 (BP Location: Left Arm, Patient Position: Sitting, Cuff Size: Large)   Pulse 85   Temp 98.8 F (37.1 C)   Wt 218 lb 7 oz (99.1 kg)   SpO2 98%   BMI 39.95 kg/m   Wt Readings from Last 3 Encounters:  04/21/17 218 lb 7 oz (99.1 kg)  04/10/17 215 lb (97.5 kg)  03/16/17 217 lb (98.4 kg)    Physical Exam  Constitutional: She is oriented to person, place, and time. She appears well-developed and well-nourished. No distress.  HENT:  Head: Normocephalic and atraumatic.  Right Ear: Hearing normal.  Left Ear: Hearing  normal.  Nose: Nose normal.  Eyes: Conjunctivae and lids are normal. Right eye exhibits no discharge. Left eye exhibits no discharge. No scleral icterus.  Cardiovascular: Normal rate, regular rhythm, normal heart sounds and intact distal  pulses.  Exam reveals no gallop and no friction rub.   No murmur heard. Pulmonary/Chest: Effort normal and breath sounds normal. No respiratory distress. She has no wheezes. She has no rales. She exhibits no tenderness.  Musculoskeletal: Normal range of motion.  Neurological: She is alert and oriented to person, place, and time. She has normal reflexes. She displays normal reflexes. No cranial nerve deficit. She exhibits normal muscle tone. Coordination normal.  Skin: Skin is warm, dry and intact. No rash noted. She is not diaphoretic. No erythema. No pallor.  Psychiatric: She has a normal mood and affect. Her speech is normal and behavior is normal. Judgment and thought content normal. Cognition and memory are normal.  Nursing note and vitals reviewed.   Results for orders placed or performed during the hospital encounter of 04/10/17  Basic metabolic panel  Result Value Ref Range   Sodium 141 135 - 145 mmol/L   Potassium 3.8 3.5 - 5.1 mmol/L   Chloride 109 101 - 111 mmol/L   CO2 26 22 - 32 mmol/L   Glucose, Bld 89 65 - 99 mg/dL   BUN 8 6 - 20 mg/dL   Creatinine, Ser 1.61 0.44 - 1.00 mg/dL   Calcium 8.3 (L) 8.9 - 10.3 mg/dL   GFR calc non Af Amer >60 >60 mL/min   GFR calc Af Amer >60 >60 mL/min   Anion gap 6 5 - 15  CBC  Result Value Ref Range   WBC 11.0 3.6 - 11.0 K/uL   RBC 5.33 (H) 3.80 - 5.20 MIL/uL   Hemoglobin 14.6 12.0 - 16.0 g/dL   HCT 09.6 04.5 - 40.9 %   MCV 84.0 80.0 - 100.0 fL   MCH 27.3 26.0 - 34.0 pg   MCHC 32.6 32.0 - 36.0 g/dL   RDW 81.1 91.4 - 78.2 %   Platelets 387 150 - 440 K/uL  Troponin I  Result Value Ref Range   Troponin I <0.03 <0.03 ng/mL      Assessment & Plan:   Problem List Items Addressed This Visit      Cardiovascular and Mediastinum   Migraine - Primary    Now with aura and blurred vision. Continue amitriptyline. Will add cymbalta to help with mood. Toradol shot given today for acute migraine. Will obtain MRI given change in migraines.  Ordered today. Follow up 1 month.       Relevant Medications   amitriptyline (ELAVIL) 25 MG tablet   ketorolac (TORADOL) injection 30 mg   DULoxetine (CYMBALTA) 30 MG capsule     Other   Depression    Not under good control. Will start cymbalta. Recheck 1 month. Call with any concerns.       Relevant Medications   amitriptyline (ELAVIL) 25 MG tablet   DULoxetine (CYMBALTA) 30 MG capsule    Other Visit Diagnoses    Change in vision       Only with migraine. Possible aura. Will obtain MRI to R/O other causes.    Other headache syndrome       Relevant Medications   amitriptyline (ELAVIL) 25 MG tablet   ketorolac (TORADOL) injection 30 mg   DULoxetine (CYMBALTA) 30 MG capsule   Other Relevant Orders   MR Brain Wo Contrast   Other symptoms  and signs involving the nervous system       Relevant Orders   MR Brain Wo Contrast       Follow up plan: Return in about 4 weeks (around 05/19/2017) for follow up mood and migraines.

## 2017-04-27 ENCOUNTER — Ambulatory Visit
Admission: RE | Admit: 2017-04-27 | Discharge: 2017-04-27 | Disposition: A | Discharge status: Home / Self Care | Payer: BLUE CROSS/BLUE SHIELD | Source: Ambulatory Visit | Attending: Family Medicine | Admitting: Family Medicine

## 2017-04-27 DIAGNOSIS — G4489 Other headache syndrome: Secondary | ICD-10-CM | POA: Insufficient documentation

## 2017-04-27 DIAGNOSIS — R29818 Other symptoms and signs involving the nervous system: Secondary | ICD-10-CM

## 2017-04-29 ENCOUNTER — Telehealth: Payer: Self-pay | Admitting: Family Medicine

## 2017-04-29 ENCOUNTER — Encounter: Payer: Self-pay | Admitting: Family Medicine

## 2017-04-29 ENCOUNTER — Ambulatory Visit (INDEPENDENT_AMBULATORY_CARE_PROVIDER_SITE_OTHER): Payer: BLUE CROSS/BLUE SHIELD | Admitting: Family Medicine

## 2017-04-29 ENCOUNTER — Encounter: Payer: Self-pay | Admitting: Neurology

## 2017-04-29 ENCOUNTER — Telehealth: Payer: Self-pay

## 2017-04-29 VITALS — BP 119/82 | HR 63 | Wt 216.0 lb

## 2017-04-29 DIAGNOSIS — R519 Headache, unspecified: Secondary | ICD-10-CM

## 2017-04-29 DIAGNOSIS — G4459 Other complicated headache syndrome: Secondary | ICD-10-CM | POA: Diagnosis not present

## 2017-04-29 DIAGNOSIS — G43809 Other migraine, not intractable, without status migrainosus: Secondary | ICD-10-CM

## 2017-04-29 DIAGNOSIS — R9389 Abnormal findings on diagnostic imaging of other specified body structures: Secondary | ICD-10-CM

## 2017-04-29 DIAGNOSIS — G932 Benign intracranial hypertension: Secondary | ICD-10-CM

## 2017-04-29 DIAGNOSIS — R938 Abnormal findings on diagnostic imaging of other specified body structures: Secondary | ICD-10-CM

## 2017-04-29 DIAGNOSIS — R51 Headache: Secondary | ICD-10-CM

## 2017-04-29 MED ORDER — HYDROCODONE-ACETAMINOPHEN 5-325 MG PO TABS: 1.0000 | ORAL_TABLET | Freq: Four times a day (QID) | ORAL | 0 refills | Status: DC | PRN

## 2017-04-29 NOTE — Telephone Encounter (Signed)
Pt's insurance Herbalist) requesting peer-to-peer for MRI w/ contrast. Please call: [employer] @ [phone], provide member id: [phone]

## 2017-04-29 NOTE — Progress Notes (Signed)
BP 119/82   Pulse 63   Wt 216 lb (98 kg)   LMP 04/24/2017   SpO2 97%   BMI 39.51 kg/m    Subjective:    Patient ID: Alexis Rangel, female    DOB: Dec 05, 1985, 31 y.o.   MRN: [id number]  HPI: Alexis Rangel is a 31 y.o. female  Chief Complaint  Patient presents with  . Migraines    Started yesterday. Nausea, sensitive to light and noise   Patient presents today to follow up on her recent MRI results and to discuss severe HA recurrences. MRI found increased intracranial pressure as well as possible 65mm nodule, recommended f/u contrast MRI. MRI ordered, and Neurology consult requested. Current episode started yesterday, and comes with photophobia, phonophobia, and nausea. Denies vomiting, weakness, syncope, speech or memory deficits. Taking all of her usual medications for HAs with no relief. Pain is severe and has kept her out of work.   Relevant past medical, surgical, family and social history reviewed and updated as indicated. Interim medical history since our last visit reviewed. Allergies and medications reviewed and updated.  Review of Systems  Constitutional: Negative.   HENT:       Phonophobia  Eyes: Positive for photophobia.  Respiratory: Negative.   Cardiovascular: Negative.   Gastrointestinal: Positive for nausea.  Genitourinary: Negative.   Musculoskeletal: Negative.   Neurological: Positive for headaches.  Psychiatric/Behavioral: Negative.    Per HPI unless specifically indicated above     Objective:    BP 119/82   Pulse 63   Wt 216 lb (98 kg)   LMP 04/24/2017   SpO2 97%   BMI 39.51 kg/m   Wt Readings from Last 3 Encounters:  04/29/17 216 lb (98 kg)  04/21/17 218 lb 7 oz (99.1 kg)  04/10/17 215 lb (97.5 kg)    Physical Exam  Constitutional: She is oriented to person, place, and time. She appears well-developed and well-nourished. No distress.  HENT:  Head: Atraumatic.  Eyes: Pupils are equal, round, and reactive to light. Conjunctivae  are normal. No scleral icterus.  Neck: Normal range of motion. Neck supple.  Cardiovascular: Normal rate and normal heart sounds.   Pulmonary/Chest: Effort normal and breath sounds normal.  Musculoskeletal: Normal range of motion.  Neurological: She is alert and oriented to person, place, and time. She displays normal reflexes. No cranial nerve deficit. Coordination normal.  Skin: Skin is warm and dry.  Nursing note and vitals reviewed.   Results for orders placed or performed during the hospital encounter of 04/10/17  Basic metabolic panel  Result Value Ref Range   Sodium 141 135 - 145 mmol/L   Potassium 3.8 3.5 - 5.1 mmol/L   Chloride 109 101 - 111 mmol/L   CO2 26 22 - 32 mmol/L   Glucose, Bld 89 65 - 99 mg/dL   BUN 8 6 - 20 mg/dL   Creatinine, Ser 0.15 0.44 - 1.00 mg/dL   Calcium 8.3 (L) 8.9 - 10.3 mg/dL   GFR calc non Af Amer >60 >60 mL/min   GFR calc Af Amer >60 >60 mL/min   Anion gap 6 5 - 15  CBC  Result Value Ref Range   WBC 11.0 3.6 - 11.0 K/uL   RBC 5.33 (H) 3.80 - 5.20 MIL/uL   Hemoglobin 14.6 12.0 - 16.0 g/dL   HCT 61.5 37.9 - 43.2 %   MCV 84.0 80.0 - 100.0 fL   MCH 27.3 26.0 - 34.0 pg   MCHC 32.6  32.0 - 36.0 g/dL   RDW 16.1 09.6 - 04.5 %   Platelets 387 150 - 440 K/uL  Troponin I  Result Value Ref Range   Troponin I <0.03 <0.03 ng/mL      Assessment & Plan:   Problem List Items Addressed This Visit    None    Visit Diagnoses    Abnormal MRI    -  Primary   Repeat MRI with contrast ordered and to be completed soon. Neurology referral in progress. Await results   Other complicated headache syndrome       Continue current HA regimen, states toradol isn't touching it anymore. Long discussion about narcotic precautions, small hydrocodone supply sent    Relevant Medications   HYDROcodone-acetaminophen (NORCO/VICODIN) 5-325 MG tablet   Other Relevant Orders   MR BRAIN W CONTRAST       Follow up plan: Return Neurology consult.

## 2017-04-29 NOTE — Telephone Encounter (Signed)
Called and discussed results of MRI with patient. Needs post-contrast MRI. I would also like her to see neurology. Referral generated today. MRI ordered today as well. Seeing Fleet ContrasRachel this PM- headaches have gotten much worse.

## 2017-04-30 NOTE — Patient Instructions (Signed)
Follow up for Neurology consult

## 2017-04-30 NOTE — Telephone Encounter (Signed)
MRI scheduled 05/01/2017 at 1pm.  Patient aware.

## 2017-04-30 NOTE — Telephone Encounter (Signed)
Called insurance for peer to peer on MRI with contrast.  Information given. Approval obtained:  Approval # [id number] Valid Aug 29-Sept 27 2018  Can we see about getting this done ASAP as she's seeing neurology tomorrow? Thanks!

## 2017-05-01 ENCOUNTER — Ambulatory Visit
Admission: RE | Admit: 2017-05-01 | Discharge: 2017-05-01 | Disposition: A | Discharge status: Home / Self Care | Payer: BLUE CROSS/BLUE SHIELD | Source: Ambulatory Visit | Attending: Family Medicine | Admitting: Family Medicine

## 2017-05-01 ENCOUNTER — Encounter: Payer: Self-pay | Admitting: Neurology

## 2017-05-01 ENCOUNTER — Ambulatory Visit (INDEPENDENT_AMBULATORY_CARE_PROVIDER_SITE_OTHER): Payer: BLUE CROSS/BLUE SHIELD | Admitting: Neurology

## 2017-05-01 ENCOUNTER — Telehealth: Payer: Self-pay | Admitting: Family Medicine

## 2017-05-01 VITALS — BP 118/89 | HR 86 | Ht 62.0 in | Wt 214.0 lb

## 2017-05-01 DIAGNOSIS — R51 Headache: Secondary | ICD-10-CM

## 2017-05-01 DIAGNOSIS — E6609 Other obesity due to excess calories: Secondary | ICD-10-CM | POA: Diagnosis not present

## 2017-05-01 DIAGNOSIS — H4710 Unspecified papilledema: Secondary | ICD-10-CM | POA: Diagnosis not present

## 2017-05-01 DIAGNOSIS — Z6839 Body mass index (BMI) 39.0-39.9, adult: Secondary | ICD-10-CM

## 2017-05-01 DIAGNOSIS — R519 Headache, unspecified: Secondary | ICD-10-CM | POA: Insufficient documentation

## 2017-05-01 DIAGNOSIS — G4459 Other complicated headache syndrome: Secondary | ICD-10-CM | POA: Diagnosis not present

## 2017-05-01 DIAGNOSIS — M542 Cervicalgia: Secondary | ICD-10-CM | POA: Diagnosis not present

## 2017-05-01 DIAGNOSIS — G8929 Other chronic pain: Secondary | ICD-10-CM

## 2017-05-01 DIAGNOSIS — R930 Abnormal findings on diagnostic imaging of skull and head, not elsewhere classified: Secondary | ICD-10-CM | POA: Insufficient documentation

## 2017-05-01 MED ORDER — METHYLPHENIDATE HCL ER (OSM) 18 MG PO TBCR: 18.0000 mg | EXTENDED_RELEASE_TABLET | Freq: Every day | ORAL | 0 refills | Status: DC

## 2017-05-01 MED ORDER — GADOBENATE DIMEGLUMINE 529 MG/ML IV SOLN
20.0000 mL | Freq: Once | INTRAVENOUS | Status: AC | PRN
  Administered 2017-05-01: 20 mL via INTRAVENOUS

## 2017-05-01 MED ORDER — ACETAZOLAMIDE ER 500 MG PO CP12: 500.0000 mg | ORAL_CAPSULE | Freq: Two times a day (BID) | ORAL | 5 refills | Status: DC

## 2017-05-01 NOTE — Patient Instructions (Signed)
Later today, you will have the MRI of the brain to evaluate the nodule that was seen under the ventricle on the left.  Someone will call you to set up with a lumbar puncture to determine how high the pressure of the spinal fluid is. Additionally, someone will call to set up an appointment to see an ophthalmologist so that visual field testing can't be performed.  I have started a medication called acetazolamide to help reduce the pressure of the spinal fluid.  Start now taking 1 pill a day and increase to 2 pills a day. It may cause tingling. If the tingling is severe step back to 1 pill a day  I also will give you a prescription for methylphenidate to see if that can help with weight loss.   If she tolerates that dose well we can double the dose to 36 mg.

## 2017-05-01 NOTE — Progress Notes (Signed)
[facility]  PATIENT: Alexis Rangel DOB: 16-Oct-1985  REFERRING DOCTOR OR PCP:  Olevia Perches SOURCE: Patient, notes from Dr. Laural Benes, imaging reports and MRI images on PACS  _________________________________   HISTORICAL  CHIEF COMPLAINT:  Chief Complaint  Patient presents with  . Headache    Alexis Rangel is here for eval of h/a's, onset 2-3 yrs. ago, worse in the last 6 mos. Sts. h/a's are daily, starting in neck.  Sensitive to light and sound. OTC Ibuprofen and Tylenol help some, but h/a's quickly return.  Sts. pcp started her on Cymbalta and Flexeril and Vicodin.  Visual acuity 20/30 bilat without corrective lenses/fim  . Pre-op Exam    HISTORY OF PRESENT ILLNESS:  I had the pleasure of seeing your patient, Alexis Rangel, at [facility] for neurologic consultation regarding her headaches and abnormal MRI.  She is a 31 yo woman who has been experiencing daily headaches in the back of the head and the temple region.   Pain is constant with stabbing needle like pains superimposed.   Pain is worse with light and at work [employer])..   She has mild nausea but no vomiting.   Moving does not change the headache.   Pain has been present x a year but worse the past 2 momhts.    She notes spots in her vision that can occur in any position.   She gets some dizzy spells.    She has had a few episodes of fleeting visual blacking out as she stands.    No diplopia.    She was placed on duloxetine and cyclobenzaprine without benefit.   Vicodin was prescribed but she has not taken yet.  She doe snot feel OTC NSAIDs have helped just a bit.     She has gained about 20 pounds in the last year.     Phentermine caused palpitations so was discontinued. As a child she was on Ritalin and tolerated it well.  I personally reviewed the MRI of the brain dated 04/28/2017. It shows a partially empty sella turcica and mildly wide and optic nerve sheaths.  Additionally there is a 7 mm subependymal nodule, isointense to gray matter, at the left lateral ventricle.   Adjacent brain appears normal.   REVIEW OF SYSTEMS: Constitutional: No fevers, chills, sweats, or change in appetite Eyes: No visual changes, double vision, eye pain Ear, nose and throat: No hearing loss, ear pain, nasal congestion, sore throat Cardiovascular: No chest pain, palpitations Respiratory: No shortness of breath at rest or with exertion.   No wheezes GastrointestinaI: No nausea, vomiting, diarrhea, abdominal pain, fecal incontinence Genitourinary: No dysuria, urinary retention or frequency.  No nocturia. Musculoskeletal: No neck pain, back pain Integumentary: No rash, pruritus, skin lesions Neurological: as above Psychiatric: No depression at this time.  No anxiety Endocrine: No palpitations, diaphoresis, change in appetite, change in weigh or increased thirst Hematologic/Lymphatic: No anemia, purpura, petechiae. Allergic/Immunologic: No itchy/runny eyes, nasal congestion, recent allergic reactions, rashes  ALLERGIES: No Known Allergies  HOME MEDICATIONS:  Current Outpatient Prescriptions:  .  cyclobenzaprine (FLEXERIL) 10 MG tablet, Take 10 mg by mouth daily as needed. , Disp: , Rfl: 0 .  DULoxetine (CYMBALTA) 30 MG capsule, Take 1 capsule (30 mg total) by mouth daily., Disp: 30 capsule, Rfl: 2 .  HYDROcodone-acetaminophen (NORCO/VICODIN) 5-325 MG tablet, Take 1 tablet by mouth every 6 (six) hours as needed for moderate pain., Disp: 20 tablet, Rfl: 0 .  Levonorgestrel (KYLEENA) 19.5 MG IUD,  1 Device by Intrauterine route once., Disp: 1 Intra Uterine Device, Rfl: 0 .  acetaZOLAMIDE (DIAMOX) 500 MG capsule, Take 1 capsule (500 mg total) by mouth 2 (two) times daily., Disp: 60 capsule, Rfl: 5 .  methylphenidate (CONCERTA) 18 MG PO CR tablet, Take 1 tablet (18 mg total) by mouth daily., Disp: 30 tablet, Rfl: 0  PAST MEDICAL HISTORY: Past Medical History:    Diagnosis Date  . Bipolar 1 disorder (HCC)   . Depression   . Headache   . PCO (polycystic ovaries)     PAST SURGICAL HISTORY: Past Surgical History:  Procedure Laterality Date  . ccy  06/09/2014   pt states she does not remember this surgery  . CHOLECYSTECTOMY    . TONSILLECTOMY AND ADENOIDECTOMY    . WISDOM TOOTH EXTRACTION      FAMILY HISTORY: Family History  Problem Relation Age of Onset  . Cancer Mother        cervical  . COPD Mother   . Hyperlipidemia Father   . Hypertension Father   . Migraines Father   . Bipolar disorder Sister   . Heart disease Maternal Grandmother   . COPD Maternal Grandmother   . Heart disease Maternal Grandfather   . Hypertension Paternal Grandmother   . Heart disease Paternal Grandmother   . Hypertension Paternal Grandfather   . Heart disease Paternal Grandfather   . Alzheimer's disease Paternal Grandfather   . Diabetes Neg Hx     SOCIAL HISTORY:  Social History   Social History  . Marital status: Single    Spouse name: N/A  . Number of children: N/A  . Years of education: N/A   Occupational History  . Not on file.   Social History Main Topics  . Smoking status: Never Smoker  . Smokeless tobacco: Never Used  . Alcohol use No  . Drug use: No  . Sexual activity: Yes    Birth control/ protection: IUD     Comment: kyleena   Other Topics Concern  . Not on file   Social History Narrative  . No narrative on file     PHYSICAL EXAM  Vitals:   05/01/17 0830  BP: 118/89  Pulse: 86  Weight: 214 lb (97.1 kg)  Height: 5\' 2"  (1.575 m)    Body mass index is 39.14 kg/m.   General: The patient is well-developed and well-nourished and in no acute distress  Eyes:  There is mild bilateral papilledema  Neck: The neck is supple, no carotid bruits are noted.  The neck is tender at the occiput, left greater than right  Cardiovascular: The heart has a regular rate and rhythm with a normal S1 and S2. There were no murmurs,  gallops or rubs. Lungs are clear to auscultation.  Skin: Extremities are without significant edema.  Musculoskeletal:  Back is nontender  Neurologic Exam  Mental status: The patient is alert and oriented x 3 at the time of the examination. The patient has apparent normal recent and remote memory, with an apparently normal attention span and concentration ability.   Speech is normal.  Cranial nerves: Extraocular movements are full. Pupils are equal, round, and reactive to light and accomodation.  Visual fields are full.  Facial symmetry is present. There is good facial sensation to soft touch bilaterally.Facial strength is normal.  Trapezius and sternocleidomastoid strength is normal. No dysarthria is noted.  The tongue is midline, and the patient has symmetric elevation of the soft palate. No obvious hearing deficits are  noted.  Motor:  Muscle bulk is normal.   Tone is normal. Strength is  5 / 5 in all 4 extremities.   Sensory: Sensory testing is intact to pinprick, soft touch and vibration sensation in all 4 extremities.  Coordination: Cerebellar testing reveals good finger-nose-finger and heel-to-shin bilaterally.  Gait and station: Station is normal.   Gait is normal. Tandem gait is normal. Romberg is negative.   Reflexes: Deep tendon reflexes are 2 symmetric in the arms and 3 symmetric in the legs.   Plantar responses are flexor.    DIAGNOSTIC DATA (LABS, IMAGING, TESTING) - I reviewed patient records, labs, notes, testing and imaging myself where available.  Lab Results  Component Value Date   WBC 11.0 04/10/2017   HGB 14.6 04/10/2017   HCT 44.7 04/10/2017   MCV 84.0 04/10/2017   PLT 387 04/10/2017      Component Value Date/Time   NA 141 04/10/2017 0910   NA 140 09/04/2014 1651   K 3.8 04/10/2017 0910   K 3.8 09/04/2014 1651   CL 109 04/10/2017 0910   CL 107 09/04/2014 1651   CO2 26 04/10/2017 0910   CO2 25 09/04/2014 1651   GLUCOSE 89 04/10/2017 0910   GLUCOSE 78  09/04/2014 1651   BUN 8 04/10/2017 0910   BUN 8 09/04/2014 1651   CREATININE 0.73 04/10/2017 0910   CREATININE 0.67 09/04/2014 1651   CALCIUM 8.3 (L) 04/10/2017 0910   CALCIUM 8.4 (L) 09/04/2014 1651   PROT 7.7 05/08/2015 1706   PROT 7.4 06/01/2014 1037   ALBUMIN 4.0 05/08/2015 1706   ALBUMIN 3.5 06/01/2014 1037   AST 26 05/08/2015 1706   AST 16 06/01/2014 1037   ALT 26 05/08/2015 1706   ALT 23 06/01/2014 1037   ALKPHOS 89 05/08/2015 1706   ALKPHOS 87 06/01/2014 1037   BILITOT 0.5 05/08/2015 1706   BILITOT 0.3 06/01/2014 1037   GFRNONAA >60 04/10/2017 0910   GFRNONAA >60 09/04/2014 1651   GFRNONAA >60 05/03/2013 1134   GFRAA >60 04/10/2017 0910   GFRAA >60 09/04/2014 1651   GFRAA >60 05/03/2013 1134   No results found for: CHOL, HDL, LDLCALC, LDLDIRECT, TRIG, CHOLHDL Lab Results  Component Value Date   HGBA1C 5.7 (H) 04/15/2016   No results found for: VITAMINB12 Lab Results  Component Value Date   TSH 2.520 01/18/2016       ASSESSMENT AND PLAN  Papilledema - Plan: DG FLUORO GUIDED LOC OF NEEDLE/CATH TIP FOR SPINAL INJECT LT, Ambulatory referral to Ophthalmology  Chronic intractable headache, unspecified headache type - Plan: DG FLUORO GUIDED LOC OF NEEDLE/CATH TIP FOR SPINAL INJECT LT, Ambulatory referral to Ophthalmology  Class 2 obesity due to excess calories with body mass index (BMI) of 39.0 to 39.9 in adult, unspecified whether serious comorbidity present  Neck pain  In summary, Alexis Rangel is a 31 year old woman with headaches who has mild papilledema and an enlarged sella turcica on examination, most consistent with idiopathic intracranial hypertension (pseudotumor cerebri).   She also has left greater than right occipital tenderness and could have superimposed chronic headache from occipital neuralgia.    Furthermore, the MRI of the brain shows a subependymal nodule on the left. She does appear to have some Edema elevated pressure is likely playing a  role in her headaches. We will arrange for her to have a lumbar puncture to measure opening pressure and start Diamox 500 mg initially and increase to 500 mg twice a day. Additionally I will have her  see ophthalmology to have a baseline visual field test. There were no gross VF abnormalities on examination.   There is tenderness in the left occipital region more than the right and splenius capitis muscle injections were performed with 80 mg Depo-Medrol in 3 mL Marcaine. She tolerated the procedure well and headaches were better afterwards.  She will return to see me after her studies but call sooner if there are new or worsening neurologic symptoms. I also gave her note to be excused from work until Monday.  Thank you for asking me to see Alexis Rangel for a neurologic consultation. Please let me know if I can be of further assistance with her or other patients in the future.   Ileane Sando A. Epimenio Foot, MD, [facility] 05/01/2017, 9:53 AM Certified in Neurology, Clinical Neurophysiology, Sleep Medicine, Pain Medicine and Neuroimaging  [facility] [address] [place], [place] [address] [phone]

## 2017-05-01 NOTE — Telephone Encounter (Signed)
Called to let Alexis Rangel know that the small nodule seen on her 1st MRI was nothing but normal brain tissue. Nothing at all to worry about. LMOM for her to call back. OK to give her this message when she calls back.

## 2017-05-01 NOTE — Telephone Encounter (Signed)
Discussed results with patient

## 2017-05-07 ENCOUNTER — Encounter: Payer: Self-pay | Admitting: Family Medicine

## 2017-05-07 ENCOUNTER — Ambulatory Visit (INDEPENDENT_AMBULATORY_CARE_PROVIDER_SITE_OTHER): Payer: BLUE CROSS/BLUE SHIELD | Admitting: Family Medicine

## 2017-05-07 ENCOUNTER — Encounter: Payer: BLUE CROSS/BLUE SHIELD | Admitting: Obstetrics and Gynecology

## 2017-05-07 VITALS — BP 126/85 | HR 80 | Temp 97.9°F | Wt 212.2 lb

## 2017-05-07 DIAGNOSIS — H9313 Tinnitus, bilateral: Secondary | ICD-10-CM

## 2017-05-07 DIAGNOSIS — R202 Paresthesia of skin: Secondary | ICD-10-CM | POA: Diagnosis not present

## 2017-05-07 DIAGNOSIS — G43109 Migraine with aura, not intractable, without status migrainosus: Secondary | ICD-10-CM | POA: Diagnosis not present

## 2017-05-07 NOTE — Assessment & Plan Note (Signed)
No better- continue to follow with neurology. Call with any concerns.

## 2017-05-07 NOTE — Progress Notes (Signed)
BP 126/85   Pulse 80   Temp 97.9 F (36.6 C)   Wt 212 lb 3.2 oz (96.3 kg)   LMP 04/24/2017   SpO2 100%   BMI 38.81 kg/m    Subjective:    Patient ID: Alexis Rangel, female    DOB: March 20, 1986, 31 y.o.   MRN: [id number]  HPI: Alexis Rangel is a 31 y.o. female  Chief Complaint  Patient presents with  . Headache    pt states she has a numbe feeling across her forehead and top of head, like having a hat on. Patient also states that her lips are numb and that she sometimes spaces out and sees stars. States she is seeing an eye doctor next Tuesday.   Saw neurology on 05/01/17 by neurology. She is thought to have pseudotumor cerebri and occipital neuralgia. She had a trigger point injection in her splenius capitus on  8/31 which improved her headaches. She is to have a LP with opening pressure done on 05/15/17. She also started diamox and is taking 2 a day. She is supposed to follow up with neurology on 05/28/17.   She notes that from Friday she started having paresthesias in her forehead. Notes that she has been feeling like she has a hat on her head, but she doesn't. Tingling and pressure around her head like a hat. Also having some numbness in her lips. Also ringing in both of her ears. She notes that it comes and goes. Mainly at night, but frequently recently. Ringing usually lasts about 5 minutes and then gets better. Goes totally away and then comes back. Ringing happens for about 5 minutes a couple of times a day, no new exposure to loud noises.  She notes that her lips feel numb and like they're going to chatter constantly. Numbness in her head comes and goes, usually lasting a few minutes a few times a day. Eyes tend to unfocus when this happens.   She notes that the trigger point injections did not help and seemed to make her headache worse. She notes that her headache is coming and going today, but is still there.   Relevant past medical, surgical, family and social history  reviewed and updated as indicated. Interim medical history since our last visit reviewed. Allergies and medications reviewed and updated.  Review of Systems  Constitutional: Negative.   HENT: Positive for tinnitus. Negative for congestion, dental problem, drooling, ear discharge, ear pain, facial swelling, hearing loss, mouth sores, nosebleeds, postnasal drip, rhinorrhea, sinus pain, sinus pressure, sneezing, sore throat, trouble swallowing and voice change.   Eyes: Positive for visual disturbance. Negative for photophobia, pain, discharge, redness and itching.  Respiratory: Negative.   Cardiovascular: Negative.   Endocrine: Negative.   Neurological: Positive for dizziness, tremors, numbness and headaches. Negative for seizures, syncope, facial asymmetry, speech difficulty, weakness and light-headedness.  Hematological: Negative.   Psychiatric/Behavioral: Negative.     Per HPI unless specifically indicated above     Objective:    BP 126/85   Pulse 80   Temp 97.9 F (36.6 C)   Wt 212 lb 3.2 oz (96.3 kg)   LMP 04/24/2017   SpO2 100%   BMI 38.81 kg/m   Wt Readings from Last 3 Encounters:  05/07/17 212 lb 3.2 oz (96.3 kg)  05/01/17 214 lb (97.1 kg)  04/29/17 216 lb (98 kg)    Physical Exam  Constitutional: She is oriented to person, place, and time. She appears well-developed and well-nourished. No  distress.  HENT:  Head: Normocephalic and atraumatic.  Right Ear: Hearing and external ear normal.  Left Ear: Hearing and external ear normal.  Nose: Nose normal.  Mouth/Throat: Oropharynx is clear and moist. No oropharyngeal exudate.  Eyes: Pupils are equal, round, and reactive to light. Conjunctivae, EOM and lids are normal. Right eye exhibits no discharge. Left eye exhibits no discharge. No scleral icterus.  Neck: Normal range of motion. Neck supple. No JVD present. No tracheal deviation present. No thyromegaly present.  Cardiovascular: Normal rate, regular rhythm, normal heart  sounds and intact distal pulses.  Exam reveals no gallop and no friction rub.   No murmur heard. Pulmonary/Chest: Effort normal and breath sounds normal. No stridor. No respiratory distress. She has no wheezes. She has no rales. She exhibits no tenderness.  Musculoskeletal: Normal range of motion.  Lymphadenopathy:    She has no cervical adenopathy.  Neurological: She is alert and oriented to person, place, and time.  Skin: Skin is warm, dry and intact. No rash noted. She is not diaphoretic. No erythema. No pallor.  Psychiatric: She has a normal mood and affect. Her speech is normal and behavior is normal. Judgment and thought content normal. Cognition and memory are normal.  Nursing note and vitals reviewed.   Results for orders placed or performed during the hospital encounter of 04/10/17  Basic metabolic panel  Result Value Ref Range   Sodium 141 135 - 145 mmol/L   Potassium 3.8 3.5 - 5.1 mmol/L   Chloride 109 101 - 111 mmol/L   CO2 26 22 - 32 mmol/L   Glucose, Bld 89 65 - 99 mg/dL   BUN 8 6 - 20 mg/dL   Creatinine, Ser 1.610.73 0.44 - 1.00 mg/dL   Calcium 8.3 (L) 8.9 - 10.3 mg/dL   GFR calc non Af Amer >60 >60 mL/min   GFR calc Af Amer >60 >60 mL/min   Anion gap 6 5 - 15  CBC  Result Value Ref Range   WBC 11.0 3.6 - 11.0 K/uL   RBC 5.33 (H) 3.80 - 5.20 MIL/uL   Hemoglobin 14.6 12.0 - 16.0 g/dL   HCT 09.644.7 04.535.0 - 40.947.0 %   MCV 84.0 80.0 - 100.0 fL   MCH 27.3 26.0 - 34.0 pg   MCHC 32.6 32.0 - 36.0 g/dL   RDW 81.114.5 91.411.5 - 78.214.5 %   Platelets 387 150 - 440 K/uL  Troponin I  Result Value Ref Range   Troponin I <0.03 <0.03 ng/mL      Assessment & Plan:   Problem List Items Addressed This Visit      Cardiovascular and Mediastinum   Migraine    No better- continue to follow with neurology. Call with any concerns.       Relevant Medications   acetaminophen (TYLENOL) 500 MG tablet    Other Visit Diagnoses    Paresthesias    -  Primary   Possibly due to her diamox. Phone call  out to her neurologist, they are not able to see her right now. We will send note from today to her neuologist.    Tinnitus of both ears       Clear EACs. Given abnormal audiogram, we will refer to ENT. Await appointment.    Relevant Orders   Ambulatory referral to ENT       Follow up plan: Return As scheduled.

## 2017-05-08 ENCOUNTER — Telehealth: Payer: Self-pay | Admitting: Family Medicine

## 2017-05-08 NOTE — Telephone Encounter (Signed)
-----   Message from Richard A Sater, MD sent at 05/08/2017 12:33 PM EDT ----- Dr. Johnson,  Thank you for forwarding this to me. I tried to call [name] today but got her voice mail. I left a message. The tingling is almost definitely do to the Diamox. Often, vitamin C 500 mg (OTC) with each Diamox pill will greatly reduce the tingling. I left this recommendation in the voice mail message to her.    Faith, Please call [name] on Monday to make sure that she got this message. She is scheduled soon for her lumbar puncture to determine if she has elevated intracranial pressures.   Thank you, Richard Sater, M.D., PHD ----- Message ----- From: Johnson, Megan P, DO Sent: 05/07/2017   1:12 PM To: Richard A Sater, MD  Hi Dr. Sater,   I saw our mutual patient Alexis Rangel today for an acute visit. She notes that since her visit with you, she has been having some paresthesias in a band around her head as well as some peri-oral paresthesias. She did start her diamox, and was not sure if this was just a reaction to it. We called your office to try to get her into see you sooner, but they said you're booking out to October.   I've copied her office note from today to you. Please let me know if there is anything you'd like us to do, or if you think that this is just a side effect from her medicine. Thanks so much for your care of this patient and have a great day!  -- Megan Johnson, DO  

## 2017-05-11 ENCOUNTER — Telehealth: Payer: Self-pay | Admitting: *Deleted

## 2017-05-11 NOTE — Telephone Encounter (Signed)
LMOM for pt. to ensure she got RAS' message--to take Vitamin C  with each Diamox tablet, to help with tingling.  Pt. should call if she has questions/fim

## 2017-05-11 NOTE — Telephone Encounter (Signed)
-----   Message from Asa Lenteichard A Sater, MD sent at 05/08/2017 12:33 PM EDT ----- Dr. Laural BenesJohnson,  Thank you for forwarding this to me. I tried to call [name] today but got her voice mail. I left a message. The tingling is almost definitely do to the Diamox. Often, vitamin C 500 mg (OTC) with each Diamox pill will greatly reduce the tingling. I left this recommendation in the voice mail message to her.    Shalamar Crays, Please call Alexis Rangel on Monday to make sure that she got this message. She is scheduled soon for her lumbar puncture to determine if she has elevated intracranial pressures.   Thank you, Despina Ariasichard Sater, M.D., PHD ----- Message ----- From: Dorcas CarrowJohnson, Megan P, DO Sent: 05/07/2017   1:12 PM To: Asa Lenteichard A Sater, MD  Hi Dr. Epimenio FootSater,   I saw our mutual patient Alexis Rangel today for an acute visit. She notes that since her visit with you, she has been having some paresthesias in a band around her head as well as some peri-oral paresthesias. She did start her diamox, and was not sure if this was just a reaction to it. We called your office to try to get her into see you sooner, but they said you're booking out to October.   I've copied her office note from today to you. Please let me know if there is anything you'd like us to do, or if you think that this is just a side effect from her medicine. Thanks so much for your care of this patient and have a great day!  -- Olevia PerchesMegan Johnson, DO

## 2017-05-15 ENCOUNTER — Ambulatory Visit
Admission: RE | Admit: 2017-05-15 | Discharge: 2017-05-15 | Disposition: A | Discharge status: Home / Self Care | Payer: BLUE CROSS/BLUE SHIELD | Source: Ambulatory Visit | Attending: Neurology | Admitting: Neurology

## 2017-05-15 DIAGNOSIS — G8929 Other chronic pain: Secondary | ICD-10-CM

## 2017-05-15 DIAGNOSIS — R51 Headache: Secondary | ICD-10-CM

## 2017-05-15 DIAGNOSIS — H4710 Unspecified papilledema: Secondary | ICD-10-CM

## 2017-05-15 LAB — CSF CELL COUNT WITH DIFFERENTIAL
RBC COUNT CSF: 1 {cells}/uL (ref 0–10)
WBC CSF: 1 {cells}/uL (ref 0–5)

## 2017-05-15 LAB — GLUCOSE, CSF: Glucose, CSF: 70 mg/dL (ref 40–80)

## 2017-05-15 LAB — PROTEIN, CSF: TOTAL PROTEIN, CSF: 26 mg/dL (ref 15–45)

## 2017-05-15 NOTE — Discharge Instructions (Signed)

## 2017-05-18 ENCOUNTER — Telehealth: Payer: Self-pay | Admitting: *Deleted

## 2017-05-18 MED ORDER — TOPIRAMATE 100 MG PO TABS: 100.0000 mg | ORAL_TABLET | Freq: Every day | ORAL | 1 refills | Status: DC

## 2017-05-18 NOTE — Addendum Note (Signed)
Addended by: Tori Cupps I on: 05/18/2017 10:49 AM   Modules accepted: Orders  

## 2017-05-18 NOTE — Telephone Encounter (Signed)
Pt is still having HA's not as bad, eyes are still blurry, ringing in the ears a lot worse.  Please call to discuss at [phone]

## 2017-05-18 NOTE — Telephone Encounter (Signed)
I spoke with Alexis Rangel this morning and per RAS, advised opening pressure with LP was some elevated.  She sts. she is not able to tolerate Diamox--ringing in ears despite taking Vit. C with each dose.  Per RAS, ok to switch to Topamax  qhs. Pt. is agreeable.  Rx. escribed to [facility] in [place] per her request/fim

## 2017-05-18 NOTE — Telephone Encounter (Signed)
-----   Message from Asa Lente, MD sent at 05/16/2017  7:10 PM EDT ----- Please let her know that the spinal tap did show that her pressure was elevated. I would like her to increase the Diamox to 2 pills a day if she can tolerate this

## 2017-05-18 NOTE — Addendum Note (Signed)
Addended by: Candis Schatz I on: 05/18/2017 10:49 AM   Modules accepted: Orders

## 2017-05-18 NOTE — Telephone Encounter (Signed)
Spoke with Allisson again, and per RAS, advised opening pressure on LP was some elevated.  Since she is not able to tolerate Diamox, he rec. Topamax  po qhs.  She verbalized understanding of same, is agreeable with trying Topamax.  Rx. escribed to [facility] in [place] per her request/fim

## 2017-05-18 NOTE — Telephone Encounter (Signed)
Spoke with Akylah.  She had her LP Friday 05/15/17.  Sts. h/a's are improving.  Vision still blurry bilat.  Ringing in ears louder, more frequent despite Vit. C with each Diamox dose.  Will check with RAS and call her back/fim

## 2017-05-25 ENCOUNTER — Ambulatory Visit (INDEPENDENT_AMBULATORY_CARE_PROVIDER_SITE_OTHER): Payer: BLUE CROSS/BLUE SHIELD | Admitting: Family Medicine

## 2017-05-25 ENCOUNTER — Encounter: Payer: Self-pay | Admitting: Family Medicine

## 2017-05-25 VITALS — BP 121/86 | HR 90 | Temp 97.9°F | Wt 216.6 lb

## 2017-05-25 DIAGNOSIS — G932 Benign intracranial hypertension: Secondary | ICD-10-CM | POA: Insufficient documentation

## 2017-05-25 DIAGNOSIS — G43109 Migraine with aura, not intractable, without status migrainosus: Secondary | ICD-10-CM | POA: Diagnosis not present

## 2017-05-25 MED ORDER — HYDROCODONE-ACETAMINOPHEN 5-325 MG PO TABS: 1.0000 | ORAL_TABLET | Freq: Four times a day (QID) | ORAL | 0 refills | Status: DC | PRN

## 2017-05-25 NOTE — Assessment & Plan Note (Signed)
Will be willing to fill out Short term disability paperwork- she will have them send it over. Continue to follow with neurology for management of her migraines. Seeing them again in October. Refill of pain medicine given today. Discussed that this will be the last time we fill this without neurology approval, and if she needs opiates for her headaches, will need to be discussed with neurology.

## 2017-05-25 NOTE — Assessment & Plan Note (Signed)
Will be willing to fill out Short term disability paperwork- she will have them send it over. Continue to follow with neurology for management of her migraines. Seeing them again in October. Refill of pain medicine given today. Discussed that this will be the last time we fill this without neurology approval, and if she needs opiates for her headaches, will need to be discussed with neurology.  

## 2017-05-25 NOTE — Progress Notes (Signed)
BP 121/86 (BP Location: Left Arm, Patient Position: Sitting, Cuff Size: Large)   Pulse 90   Temp 97.9 F (36.6 C)   Wt 216 lb 9 oz (98.2 kg)   SpO2 100%   BMI 39.61 kg/m    Subjective:    Patient ID: Alexis Rangel, female    DOB: 12-09-1985, 31 y.o.   MRN: [id number]  HPI: Alexis Rangel is a 31 y.o. female  Chief Complaint  Patient presents with  . Short Term Disability    Patient is here to discuss Short Term Disability, she states that HR told her that since she is on light duty and her hours have been cut that she can use her Short Term Disability to get to her 40 hours.    She notes that her head is still hurting. Still feeling weird. Still having paresthesias. Has been talking with neurology, but has not seen them yet. She was not able to tolerate the diamox, even at low doses, so she had her medicine changed from diamox to topamax. Did have elevated pressures with her LP. Still not feeling like herself. Mainly concerned today about her finances. She notes that she is currently on light duty at work. She doesn't want to be out of work right now as she is able to do her job on light duty, however, they have cut her hours down. She is only working 15-20hours a week right now and that is really hurting her. Needs paperwork filled out for short term disability so she can get paid. She is otherwise doing well with no other concerns. She does note that she has used the 20 vicodin given to her a month ago. Uses them with severe headaches. They help her sleep. Would like a refill today.   Relevant past medical, surgical, family and social history reviewed and updated as indicated. Interim medical history since our last visit reviewed. Allergies and medications reviewed and updated.  Review of Systems  Constitutional: Negative.   Respiratory: Negative.   Cardiovascular: Negative.   Neurological: Positive for light-headedness, numbness and headaches. Negative for dizziness,  tremors, seizures, syncope, facial asymmetry, speech difficulty and weakness.  Psychiatric/Behavioral: Negative.     Per HPI unless specifically indicated above     Objective:    BP 121/86 (BP Location: Left Arm, Patient Position: Sitting, Cuff Size: Large)   Pulse 90   Temp 97.9 F (36.6 C)   Wt 216 lb 9 oz (98.2 kg)   SpO2 100%   BMI 39.61 kg/m   Wt Readings from Last 3 Encounters:  05/25/17 216 lb 9 oz (98.2 kg)  05/07/17 212 lb 3.2 oz (96.3 kg)  05/01/17 214 lb (97.1 kg)    Physical Exam  Constitutional: She is oriented to person, place, and time. She appears well-developed and well-nourished. No distress.  HENT:  Head: Normocephalic and atraumatic.  Right Ear: Hearing normal.  Left Ear: Hearing normal.  Nose: Nose normal.  Eyes: Conjunctivae and lids are normal. Right eye exhibits no discharge. Left eye exhibits no discharge. No scleral icterus.  Cardiovascular: Normal rate, regular rhythm, normal heart sounds and intact distal pulses.  Exam reveals no gallop and no friction rub.   No murmur heard. Pulmonary/Chest: Effort normal and breath sounds normal. No respiratory distress. She has no wheezes. She has no rales. She exhibits no tenderness.  Musculoskeletal: Normal range of motion.  Neurological: She is alert and oriented to person, place, and time.  Skin: Skin is warm, dry  and intact. No rash noted. She is not diaphoretic. No erythema. No pallor.  Psychiatric: She has a normal mood and affect. Her speech is normal and behavior is normal. Judgment and thought content normal. Cognition and memory are normal.  Nursing note and vitals reviewed.   Results for orders placed or performed during the hospital encounter of 05/15/17  CSF cell count with differential  Result Value Ref Range   Color, CSF COLORLESS COLORLESS   Appearance, CSF CLEAR CLEAR   RBC Count, CSF 1 0 - 10 cells/uL   WBC, CSF 1 0 - 5 cells/uL   Segmented Neutrophils-CSF CANCELED    Comment:      Protein, CSF  Result Value Ref Range   Total Protein, CSF 26 15 - 45 mg/dL  Glucose, CSF  Result Value Ref Range   Glucose, CSF 70 40 - 80 mg/dL      Assessment & Plan:   Problem List Items Addressed This Visit      Cardiovascular and Mediastinum   Migraine - Primary    Will be willing to fill out Short term disability paperwork- she will have them send it over. Continue to follow with neurology for management of her migraines. Seeing them again in October. Refill of pain medicine given today. Discussed that this will be the last time we fill this without neurology approval, and if she needs opiates for her headaches, will need to be discussed with neurology.       Relevant Medications   HYDROcodone-acetaminophen (NORCO/VICODIN) 5-325 MG tablet   Idiopathic intracranial hypertension    Will be willing to fill out Short term disability paperwork- she will have them send it over. Continue to follow with neurology for management of her migraines. Seeing them again in October. Refill of pain medicine given today. Discussed that this will be the last time we fill this without neurology approval, and if she needs opiates for her headaches, will need to be discussed with neurology.           Follow up plan: Return in about 6 months (around 11/22/2017).

## 2017-05-26 ENCOUNTER — Encounter: Payer: Self-pay | Admitting: Family Medicine

## 2017-05-28 ENCOUNTER — Ambulatory Visit (INDEPENDENT_AMBULATORY_CARE_PROVIDER_SITE_OTHER): Payer: BLUE CROSS/BLUE SHIELD | Admitting: Neurology

## 2017-05-28 ENCOUNTER — Encounter: Payer: Self-pay | Admitting: Neurology

## 2017-05-28 ENCOUNTER — Ambulatory Visit: Payer: BLUE CROSS/BLUE SHIELD | Admitting: Neurology

## 2017-05-28 VITALS — BP 131/85 | HR 98 | Resp 16 | Ht 62.0 in | Wt 216.5 lb

## 2017-05-28 DIAGNOSIS — R519 Headache, unspecified: Secondary | ICD-10-CM

## 2017-05-28 DIAGNOSIS — R51 Headache: Secondary | ICD-10-CM

## 2017-05-28 DIAGNOSIS — E6609 Other obesity due to excess calories: Secondary | ICD-10-CM

## 2017-05-28 DIAGNOSIS — Z6839 Body mass index (BMI) 39.0-39.9, adult: Secondary | ICD-10-CM

## 2017-05-28 DIAGNOSIS — M542 Cervicalgia: Secondary | ICD-10-CM | POA: Diagnosis not present

## 2017-05-28 DIAGNOSIS — R202 Paresthesia of skin: Secondary | ICD-10-CM | POA: Diagnosis not present

## 2017-05-28 MED ORDER — METHYLPHENIDATE HCL ER (OSM) 18 MG PO TBCR: 18.0000 mg | EXTENDED_RELEASE_TABLET | Freq: Every day | ORAL | 0 refills | Status: DC

## 2017-05-28 MED ORDER — NORTRIPTYLINE HCL 25 MG PO CAPS: 25.0000 mg | ORAL_CAPSULE | Freq: Every day | ORAL | 3 refills | Status: DC

## 2017-05-28 NOTE — Progress Notes (Signed)
[facility]  PATIENT: Alexis Rangel DOB: 1986-03-06  REFERRING DOCTOR OR PCP:  Olevia Perches SOURCE: Patient, notes from Dr. Laural Benes, imaging reports and MRI images on PACS  _________________________________   HISTORICAL  CHIEF COMPLAINT:  Chief Complaint  Patient presents with  . Pseudotumor Cerebri    Opening pressure with LP was some elevated. She was not able to tolerate Diamox (tingling, even with Vit. C with each dose).  Switched to Topamax  qhs, but sts. tingling has persisted, worsened, now in feet as well.  Sts. h/a's are about the same.  She saw opthalmology (Dr. Dione Booze) on 05/01/17, sts. he didn't find anything, wants to see her back in 3 mos/fim    HISTORY OF PRESENT ILLNESS:  Alexis Rangel Is a 31 year old woman with headaches and mildly elevated intracranial pressure.Marland Kitchen  Update 05/28/2017:    Opening pressure was elevated (25.5 cm) when she had lumbar puncture 05/15/2017.  She was unable to tolerate Diamox due to severe tingling. She continues to have tingling with Topamax. Headaches are doing about the same.   She saw her ophthalmologist (Dr. Dione Booze on 05/01/2017).  I reviewed his notes. He did not see any papilledema  The headache is bilateral but left > right.  HA is minimally better on Topamax but did not improve after the LP.    She notes mild blurry vision still.  She notes numbness in her face and also in the hands and feet.    She is also on duloxetine 30 mg qAM.    She takes Vicodin 5 mg prn and Flexeril qHS prn.     At the last visit, Ritalin was also started to help her lose weight but she has not noted any difference.     _________________________________ From 8/31.2018  She is a 31 yo woman who has been experiencing daily headaches in the back of the head and the temple region.   Pain is constant with stabbing needle like pains superimposed.   Pain is worse with light and at work [employer])..   She has mild nausea but  no vomiting.   Moving does not change the headache.   Pain has been present x a year but worse the past 2 momhts.    She notes spots in her vision that can occur in any position.   She gets some dizzy spells.    She has had a few episodes of fleeting visual blacking out as she stands.    No diplopia.    She was placed on duloxetine and cyclobenzaprine without benefit.   Vicodin was prescribed but she has not taken yet.  She doe snot feel OTC NSAIDs have helped just a bit.     She has gained about 20 pounds in the last year.     Phentermine caused palpitations so was discontinued. As a child she was on Ritalin and tolerated it well.  I personally reviewed the MRI of the brain dated 04/28/2017. It shows a partially empty sella turcica and mildly wide and optic nerve sheaths. Additionally there is a 7 mm subependymal nodule, isointense to gray matter, at the left lateral ventricle.   Adjacent brain appears normal.   REVIEW OF SYSTEMS: Constitutional: No fevers, chills, sweats, or change in appetite Eyes: No visual changes, double vision, eye pain Ear, nose and throat: No hearing loss, ear pain, nasal congestion, sore throat Cardiovascular: No chest pain, palpitations Respiratory: No shortness of breath at rest or with exertion.   No  wheezes GastrointestinaI: No nausea, vomiting, diarrhea, abdominal pain, fecal incontinence Genitourinary: No dysuria, urinary retention or frequency.  No nocturia. Musculoskeletal: No neck pain, back pain Integumentary: No rash, pruritus, skin lesions Neurological: as above Psychiatric: No depression at this time.  No anxiety Endocrine: No palpitations, diaphoresis, change in appetite, change in weigh or increased thirst Hematologic/Lymphatic: No anemia, purpura, petechiae. Allergic/Immunologic: No itchy/runny eyes, nasal congestion, recent allergic reactions, rashes  ALLERGIES: No Known Allergies  HOME MEDICATIONS:  Current Outpatient Prescriptions:  .   acetaminophen (TYLENOL) 500 MG tablet, Take 500 mg by mouth every 6 (six) hours as needed., Disp: , Rfl:  .  cyclobenzaprine (FLEXERIL) 10 MG tablet, Take 10 mg by mouth daily as needed. , Disp: , Rfl: 0 .  DULoxetine (CYMBALTA) 30 MG capsule, Take 1 capsule (30 mg total) by mouth daily., Disp: 30 capsule, Rfl: 2 .  HYDROcodone-acetaminophen (NORCO/VICODIN) 5-325 MG tablet, Take 1 tablet by mouth every 6 (six) hours as needed for moderate pain., Disp: 20 tablet, Rfl: 0 .  Levonorgestrel (KYLEENA) 19.5 MG IUD, 1 Device by Intrauterine route once., Disp: 1 Intra Uterine Device, Rfl: 0 .  methylphenidate (CONCERTA) 18 MG PO CR tablet, Take 1 tablet (18 mg total) by mouth daily., Disp: 30 tablet, Rfl: 0 .  nortriptyline (PAMELOR) 25 MG capsule, Take 1 capsule (25 mg total) by mouth at bedtime., Disp: 30 capsule, Rfl: 3  PAST MEDICAL HISTORY: Past Medical History:  Diagnosis Date  . Bipolar 1 disorder (HCC)   . Depression   . Headache   . PCO (polycystic ovaries)     PAST SURGICAL HISTORY: Past Surgical History:  Procedure Laterality Date  . ccy  06/09/2014   pt states she does not remember this surgery  . CHOLECYSTECTOMY    . TONSILLECTOMY AND ADENOIDECTOMY    . WISDOM TOOTH EXTRACTION      FAMILY HISTORY: Family History  Problem Relation Age of Onset  . Cancer Mother        cervical  . COPD Mother   . Hyperlipidemia Father   . Hypertension Father   . Migraines Father   . Bipolar disorder Sister   . Heart disease Maternal Grandmother   . COPD Maternal Grandmother   . Heart disease Maternal Grandfather   . Hypertension Paternal Grandmother   . Heart disease Paternal Grandmother   . Hypertension Paternal Grandfather   . Heart disease Paternal Grandfather   . Alzheimer's disease Paternal Grandfather   . Diabetes Neg Hx     SOCIAL HISTORY:  Social History   Social History  . Marital status: Single    Spouse name: N/A  . Number of children: N/A  . Years of education:  N/A   Occupational History  . Not on file.   Social History Main Topics  . Smoking status: Never Smoker  . Smokeless tobacco: Never Used  . Alcohol use No  . Drug use: No  . Sexual activity: Yes    Birth control/ protection: IUD     Comment: kyleena   Other Topics Concern  . Not on file   Social History Narrative  . No narrative on file     PHYSICAL EXAM  Vitals:   05/28/17 1425  BP: 131/85  Pulse: 98  Resp: 16  Weight: 216 lb 8 oz (98.2 kg)  Height:  (1.575 m)    Body mass index is 39.6 kg/m.   General: The patient is well-developed and well-nourished and in no acute distress  Eyes:  Funduscopic examination shows no papilledema. Venous pulsations are present.   Neck: The neck is supple, no carotid bruits are noted.  The neck is tender at the occiput, left greater than right   Neurologic Exam  Mental status: The patient is alert and oriented x 3 at the time of the examination. The patient has apparent normal recent and remote memory, with an apparently normal attention span and concentration ability.   Speech is normal.  Cranial nerves: Extraocular movements are full. Pupils are equal, round, and reactive to light and accomodation.  Facial strength and sensation is normal. Trapezius strength is normal. The tongue is midline, and the patient has symmetric elevation of the soft palate. No obvious hearing deficits are noted.  Motor:  Muscle bulk is normal.   Tone is normal. Strength is  5 / 5 in all 4 extremities.   Sensory: Sensory testing is intact to touch in all 4 extremities.  Coordination: Cerebellar testing reveals good finger-nose-finger   Gait and station: Station is normal.   Gait is normal. Tandem gait is normal. Romberg is negative.   Reflexes: Deep tendon reflexes are 2 symmetric in the arms and 3 symmetric in the legs.        DIAGNOSTIC DATA (LABS, IMAGING, TESTING) - I reviewed patient records, labs, notes, testing and imaging myself  where available.  Lab Results  Component Value Date   WBC 11.0 04/10/2017   HGB 14.6 04/10/2017   HCT 44.7 04/10/2017   MCV 84.0 04/10/2017   PLT 387 04/10/2017      Component Value Date/Time   NA 141 04/10/2017 0910   NA 140 09/04/2014 1651   K 3.8 04/10/2017 0910   K 3.8 09/04/2014 1651   CL 109 04/10/2017 0910   CL 107 09/04/2014 1651   CO2 26 04/10/2017 0910   CO2 25 09/04/2014 1651   GLUCOSE 89 04/10/2017 0910   GLUCOSE 78 09/04/2014 1651   BUN 8 04/10/2017 0910   BUN 8 09/04/2014 1651   CREATININE 0.73 04/10/2017 0910   CREATININE 0.67 09/04/2014 1651   CALCIUM 8.3 (L) 04/10/2017 0910   CALCIUM 8.4 (L) 09/04/2014 1651   PROT 7.7 05/08/2015 1706   PROT 7.4 06/01/2014 1037   ALBUMIN 4.0 05/08/2015 1706   ALBUMIN 3.5 06/01/2014 1037   AST 26 05/08/2015 1706   AST 16 06/01/2014 1037   ALT 26 05/08/2015 1706   ALT 23 06/01/2014 1037   ALKPHOS 89 05/08/2015 1706   ALKPHOS 87 06/01/2014 1037   BILITOT 0.5 05/08/2015 1706   BILITOT 0.3 06/01/2014 1037   GFRNONAA >60 04/10/2017 0910   GFRNONAA >60 09/04/2014 1651   GFRNONAA >60 05/03/2013 1134   GFRAA >60 04/10/2017 0910   GFRAA >60 09/04/2014 1651   GFRAA >60 05/03/2013 1134   No results found for: CHOL, HDL, LDLCALC, LDLDIRECT, TRIG, CHOLHDL Lab Results  Component Value Date   HGBA1C 5.7 (H) 04/15/2016   No results found for: VITAMINB12 Lab Results  Component Value Date   TSH 2.520 01/18/2016       ASSESSMENT AND PLAN  Chronic intractable headache, unspecified headache type  Neck pain  Tingling  Class 2 obesity due to excess calories with body mass index (BMI) of 39.0 to 39.9 in adult, unspecified whether serious comorbidity present  1.   Her opening pressure was just borderline high at 25.5 cm and she did not have any papilledema on Dr. Laruth Bouchard exam 05/01/2017 or on my exam today. The MRI did show a  partially empty sella turcica.    I discussed with her that I think it is more likely that her  headaches are muskuloskeletal in origin.    She does have a lot of tenderness in the back of he head and that may be playing a role as chronic tension-type headache.  I will stop the Topamax since she is not able to tolerated well and it has not helped. I'll have her try nortriptyline 25 mg nightly. She also can take cyclobenzaprine at night. 2.   I'll renew her methylphenidate. 3.   She will return to see me in 3 weeks or sooner if there are new or worsening neurologic symptoms.  Yashika Mask A. Epimenio Foot, MD, [facility] 05/28/2017, 3:27 PM Certified in Neurology, Clinical Neurophysiology, Sleep Medicine, Pain Medicine and Neuroimaging  [facility] [address], [address] [place], [place] [address] [phone]

## 2017-06-01 ENCOUNTER — Ambulatory Visit (INDEPENDENT_AMBULATORY_CARE_PROVIDER_SITE_OTHER): Payer: BLUE CROSS/BLUE SHIELD | Admitting: Family Medicine

## 2017-06-01 ENCOUNTER — Encounter: Payer: Self-pay | Admitting: Family Medicine

## 2017-06-01 VITALS — BP 124/84 | HR 89 | Temp 98.6°F | Wt 217.0 lb

## 2017-06-01 DIAGNOSIS — G43109 Migraine with aura, not intractable, without status migrainosus: Secondary | ICD-10-CM

## 2017-06-01 DIAGNOSIS — G932 Benign intracranial hypertension: Secondary | ICD-10-CM

## 2017-06-03 NOTE — Progress Notes (Signed)
FMLA forms completed, no need for OV today

## 2017-06-03 NOTE — Assessment & Plan Note (Signed)
FMLA forms filled out  

## 2017-06-10 ENCOUNTER — Telehealth: Payer: Self-pay | Admitting: Family Medicine

## 2017-06-10 ENCOUNTER — Encounter: Payer: Self-pay | Admitting: *Deleted

## 2017-06-10 ENCOUNTER — Emergency Department
Admission: EM | Admit: 2017-06-10 | Discharge: 2017-06-11 | Disposition: A | Discharge status: Home / Self Care | Payer: BLUE CROSS/BLUE SHIELD | Attending: Emergency Medicine | Admitting: Emergency Medicine

## 2017-06-10 DIAGNOSIS — F4325 Adjustment disorder with mixed disturbance of emotions and conduct: Secondary | ICD-10-CM | POA: Diagnosis not present

## 2017-06-10 DIAGNOSIS — F331 Major depressive disorder, recurrent, moderate: Secondary | ICD-10-CM

## 2017-06-10 DIAGNOSIS — Z79899 Other long term (current) drug therapy: Secondary | ICD-10-CM | POA: Diagnosis not present

## 2017-06-10 DIAGNOSIS — R45851 Suicidal ideations: Secondary | ICD-10-CM | POA: Insufficient documentation

## 2017-06-10 DIAGNOSIS — F3181 Bipolar II disorder: Secondary | ICD-10-CM

## 2017-06-10 DIAGNOSIS — F329 Major depressive disorder, single episode, unspecified: Secondary | ICD-10-CM | POA: Diagnosis present

## 2017-06-10 LAB — COMPREHENSIVE METABOLIC PANEL
ALT: 25 U/L (ref 14–54)
AST: 22 U/L (ref 15–41)
Albumin: 4.1 g/dL (ref 3.5–5.0)
Alkaline Phosphatase: 96 U/L (ref 38–126)
Anion gap: 9 (ref 5–15)
BILIRUBIN TOTAL: 0.5 mg/dL (ref 0.3–1.2)
BUN: 6 mg/dL (ref 6–20)
CHLORIDE: 104 mmol/L (ref 101–111)
CO2: 24 mmol/L (ref 22–32)
Calcium: 9.1 mg/dL (ref 8.9–10.3)
Creatinine, Ser: 0.56 mg/dL (ref 0.44–1.00)
Glucose, Bld: 112 mg/dL — ABNORMAL HIGH (ref 65–99)
POTASSIUM: 4.4 mmol/L (ref 3.5–5.1)
Sodium: 137 mmol/L (ref 135–145)
TOTAL PROTEIN: 8.2 g/dL — AB (ref 6.5–8.1)

## 2017-06-10 LAB — URINE DRUG SCREEN, QUALITATIVE (ARMC ONLY)
AMPHETAMINES, UR SCREEN: NOT DETECTED
BENZODIAZEPINE, UR SCRN: NOT DETECTED
Barbiturates, Ur Screen: NOT DETECTED
CANNABINOID 50 NG, UR ~~LOC~~: NOT DETECTED
Cocaine Metabolite,Ur ~~LOC~~: NOT DETECTED
MDMA (ECSTASY) UR SCREEN: NOT DETECTED
Methadone Scn, Ur: NOT DETECTED
Opiate, Ur Screen: NOT DETECTED
PHENCYCLIDINE (PCP) UR S: NOT DETECTED
TRICYCLIC, UR SCREEN: POSITIVE — AB

## 2017-06-10 LAB — CBC
HCT: 42.1 % (ref 35.0–47.0)
Hemoglobin: 14.2 g/dL (ref 12.0–16.0)
MCH: 28.4 pg (ref 26.0–34.0)
MCHC: 33.8 g/dL (ref 32.0–36.0)
MCV: 83.9 fL (ref 80.0–100.0)
PLATELETS: 480 10*3/uL — AB (ref 150–440)
RBC: 5.02 MIL/uL (ref 3.80–5.20)
RDW: 14.7 % — AB (ref 11.5–14.5)
WBC: 17.7 10*3/uL — AB (ref 3.6–11.0)

## 2017-06-10 LAB — ETHANOL

## 2017-06-10 LAB — ACETAMINOPHEN LEVEL: Acetaminophen (Tylenol), Serum: <10 ug/mL — ABNORMAL LOW (ref 10–30)

## 2017-06-10 LAB — POCT PREGNANCY, URINE: PREG TEST UR: NEGATIVE

## 2017-06-10 LAB — SALICYLATE LEVEL

## 2017-06-10 NOTE — Telephone Encounter (Signed)
Pt incredibly upset after being fired from her job, was taken via police to [employer] and is being IVC'd for SI. Wanted to update [place] on what was going on. Reassured her that she was in the right place to get help and to stay for several days and cool off and make sure she was safe before being released. She will call with any concerns  Routing to PCP as Lorain Childes

## 2017-06-10 NOTE — ED Notes (Signed)
Pt. Escorted to [employer] [facility] with tech and [employer].

## 2017-06-10 NOTE — Telephone Encounter (Signed)
Patient called to speak with Fleet Contras. Call was transferred to [facility].

## 2017-06-10 NOTE — ED Notes (Signed)
IVC 

## 2017-06-10 NOTE — ED Notes (Signed)
PENDING SOC CONSULT  

## 2017-06-10 NOTE — ED Notes (Signed)
[facility] called to get information on patient for placement.  Gave vitals and History on patient.

## 2017-06-10 NOTE — ED Notes (Signed)
Pt. States she was fired from her job today.  Pt. States she had been employed for two years.  Pt. States she might have given impression to someone that she had SI idealization.   Pt. Currently denies SI or HI.

## 2017-06-10 NOTE — ED Triage Notes (Signed)
Pt brought in by [facility] from [facility].  Pt is suicidal.  Pt was fired from the job today.  Pt denies HI   Pt denies etoh use or drug use.  Pt calm and cooperative.

## 2017-06-11 DIAGNOSIS — F3181 Bipolar II disorder: Secondary | ICD-10-CM

## 2017-06-11 DIAGNOSIS — F4325 Adjustment disorder with mixed disturbance of emotions and conduct: Secondary | ICD-10-CM | POA: Diagnosis not present

## 2017-06-11 MED ORDER — CYCLOBENZAPRINE HCL 10 MG PO TABS
5.0000 mg | ORAL_TABLET | Freq: Once | ORAL | Status: DC
  Filled 2017-06-11: qty 1

## 2017-06-11 MED ORDER — CYCLOBENZAPRINE HCL 10 MG PO TABS
10.0000 mg | ORAL_TABLET | Freq: Once | ORAL | Status: AC
  Administered 2017-06-11: 10 mg via ORAL

## 2017-06-11 NOTE — ED Notes (Signed)
SOC CANCELLED

## 2017-06-11 NOTE — ED Provider Notes (Signed)
[facility] Emergency Department Provider Note  ____________________________________________   First MD Initiated Contact with Patient 06/10/17 2045     (approximate)  I have reviewed the triage vital signs and the nursing notes.   HISTORY  Chief Complaint Suicidal    HPI Alexis Rangel is a 31 y.o. female with medical and psychiatric history as listed below presents with [place] police from [employer].  Reportedly she was fired from her job today which caused her to "have crazy thoughts" including suicidal ideation.  She seemed to be minimizing the symptoms to me and states that she feels better now although she is still having some thoughts about "not being here anymore", but reportedly she was more specific earlier at [facility] where she was talking about killing herself using her car.  She reports that she is bipolar but she has not been taking medication for quite an extended time because she has been fine, but she is concerned that the issue today with her job does cause her to become worse.  She is calm and cooperative at this time.  She denies any acute medical issue.  Specifically, The patient denies fever/chills, chest pain, shortness of breath, nausea, vomiting, abdominal pain, and dysuria.  She describes her depression earlier as severe.  Nothing in particular makes the patient's symptoms better nor worse.  They were relatively acute in onset after she was fired.   Past Medical History:  Diagnosis Date  . Bipolar 1 disorder (HCC)   . Depression   . Headache   . PCO (polycystic ovaries)     Patient Active Problem List   Diagnosis Date Noted  . Tingling 05/28/2017  . Idiopathic intracranial hypertension 05/25/2017  . Papilledema 05/01/2017  . Headache 05/01/2017  . Neck pain 05/01/2017  . Pelvic pain in female 05/20/2016  . Hypocalcemia 03/11/2016  . Hyperinsulinemia 03/11/2016  . PCO (polycystic ovaries)   . Family planning 12/18/2015  .  Depression 12/18/2015  . Obesity 10/17/2015  . Migraine 10/17/2015  . Cholelithiasis 10/16/2015    Past Surgical History:  Procedure Laterality Date  . ccy  06/09/2014   pt states she does not remember this surgery  . CHOLECYSTECTOMY    . TONSILLECTOMY AND ADENOIDECTOMY    . WISDOM TOOTH EXTRACTION      Prior to Admission medications   Medication Sig Start Date End Date Taking? Authorizing Provider  acetaminophen (TYLENOL) 500 MG tablet Take 500 mg by mouth every 6 (six) hours as needed.    [provider]  cyclobenzaprine (FLEXERIL) 10 MG tablet Take 10 mg by mouth daily as needed.  01/15/17   [provider]  DULoxetine (CYMBALTA) 30 MG capsule Take 1 capsule (30 mg total) by mouth daily. 04/21/17   Johnson, Megan P, DO  HYDROcodone-acetaminophen (NORCO/VICODIN) 5-325 MG tablet Take 1 tablet by mouth every 6 (six) hours as needed for moderate pain. 05/25/17   Johnson, Megan P, DO  Levonorgestrel (KYLEENA) 19.5 MG IUD 1 Device by Intrauterine route once. 01/18/16   Shambley, Melody N, CNM  methylphenidate (CONCERTA) 18 MG PO CR tablet Take 1 tablet (18 mg total) by mouth daily. 05/28/17   Sater, Pearletha Furl, MD  nortriptyline (PAMELOR) 25 MG capsule Take 1 capsule (25 mg total) by mouth at bedtime. 05/28/17   Sater, Pearletha Furl, MD    Allergies Patient has no known allergies.  Family History  Problem Relation Age of Onset  . Cancer Mother        cervical  .  COPD Mother   . Hyperlipidemia Father   . Hypertension Father   . Migraines Father   . Bipolar disorder Sister   . Heart disease Maternal Grandmother   . COPD Maternal Grandmother   . Heart disease Maternal Grandfather   . Hypertension Paternal Grandmother   . Heart disease Paternal Grandmother   . Hypertension Paternal Grandfather   . Heart disease Paternal Grandfather   . Alzheimer's disease Paternal Grandfather   . Diabetes Neg Hx     Social History Social History  Substance Use Topics  . Smoking  status: Never Smoker  . Smokeless tobacco: Never Used  . Alcohol use No    Review of Systems Constitutional: No fever/chills Eyes: No visual changes. ENT: No sore throat. Cardiovascular: Denies chest pain. Respiratory: Denies shortness of breath. Gastrointestinal: No abdominal pain.  No nausea, no vomiting.  No diarrhea.  No constipation. Genitourinary: Negative for dysuria. Musculoskeletal: Negative for neck pain.  Negative for back pain. Integumentary: Negative for rash. Neurological: Negative for headaches, focal weakness or numbness. Psychiatric:Depression and suicidal ideation  ____________________________________________   PHYSICAL EXAM:  VITAL SIGNS: ED Triage Vitals  Enc Vitals Group     BP 06/10/17 1910 (!) 134/92     Pulse Rate 06/10/17 1910 (!) 107     Resp 06/10/17 1910 20     Temp 06/10/17 1910 98.2 F (36.8 C)     Temp Source 06/10/17 1910 Oral     SpO2 06/10/17 1910 99 %     Weight 06/10/17 1911 98.4 kg (217 lb)     Height 06/10/17 1911 1.575 m ( )     Head Circumference --      Peak Flow --      Pain Score 06/10/17 1909 0     Pain Loc --      Pain Edu? --      Excl. in GC? --     Constitutional: Alert and oriented. Well appearing and in no acute distress. Eyes: Conjunctivae are normal.  Head: Atraumatic. Nose: No congestion/rhinnorhea. Mouth/Throat: Mucous membranes are moist. Neck: No stridor.  No meningeal signs.   Cardiovascular: Normal rate, regular rhythm. Good peripheral circulation. Grossly normal heart sounds. Respiratory: Normal respiratory effort.  No retractions. Lungs CTAB. Gastrointestinal: Soft and nontender. No distention.  Musculoskeletal: No lower extremity tenderness nor edema. No gross deformities of extremities. Neurologic:  Normal speech and language. No gross focal neurologic deficits are appreciated.  Skin:  Skin is warm, dry and intact. No rash noted. Psychiatric: Mood and affect are normal. Speech and behavior are  normal.  Denies active SI at this time.  ____________________________________________   LABS (all labs ordered are listed, but only abnormal results are displayed)  Labs Reviewed  COMPREHENSIVE METABOLIC PANEL - Abnormal; Notable for the following:       Result Value   Glucose, Bld 112 (*)    Total Protein 8.2 (*)    All other components within normal limits  ACETAMINOPHEN LEVEL - Abnormal; Notable for the following:    Acetaminophen (Tylenol), Serum <10 (*)    All other components within normal limits  CBC - Abnormal; Notable for the following:    WBC 17.7 (*)    RDW 14.7 (*)    Platelets 480 (*)    All other components within normal limits  URINE DRUG SCREEN, QUALITATIVE ([facility] ONLY) - Abnormal; Notable for the following:    Tricyclic, Ur Screen POSITIVE (*)    All other components within normal limits  ETHANOL  SALICYLATE LEVEL  POC URINE PREG, ED  POCT PREGNANCY, URINE   ____________________________________________  EKG  None - EKG not ordered by ED physician ____________________________________________  RADIOLOGY   No results found.  ____________________________________________   PROCEDURES  Critical Care performed: No   Procedure(s) performed:   Procedures   ____________________________________________   INITIAL IMPRESSION / ASSESSMENT AND PLAN / ED COURSE  As part of my medical decision making, I reviewed the following data within the electronic MEDICAL RECORD NUMBER Nursing notes reviewed and incorporated, Labs reviewed  and Notes from prior ED visits    The patient has no evidence of acute or emergent medical issue at this time.  All lab results are reassuring with no acute abnormalities and no evidence of acute intoxication or drug abuse.  Given that she has already been seen at [facility] and she is under an involuntary commitment and sent here, I will uphold the involuntary commitment and have her seen by Dr. Toni Amend tomorrow.  Initially my thought was to  order tele-psychiatry consult, but given that she was already seen by [facility] at the cut is more appropriate for her to be seen in person by Dr. Toni Amend.  The patient is awaiting further psychiatric evaluation.     ____________________________________________  FINAL CLINICAL IMPRESSION(S) / ED DIAGNOSES  Final diagnoses:  Moderate episode of recurrent major depressive disorder (HCC)  Suicidal ideation     MEDICATIONS GIVEN DURING THIS VISIT:  Medications  cyclobenzaprine (FLEXERIL) tablet 10 mg (10 mg Oral Given 06/11/17 0053)     NEW OUTPATIENT MEDICATIONS STARTED DURING THIS VISIT:  New Prescriptions   No medications on file    Modified Medications   No medications on file    Discontinued Medications   No medications on file     Note:  This document was prepared using Dragon voice recognition software and may include unintentional dictation errors.    Loleta Rose, MD 06/11/17 [phone]

## 2017-06-11 NOTE — BH Assessment (Signed)
Assessment Note  Alexis Rangel is an 31 y.o. female presenting to the ED, under IVC, initiated by [facility].  Pt was transported to the ED from [facility] by [employer].  According to the IVC, pt was fired from her job today which caused her to "have crazy thoughts" including suicidal ideation and "not wanting to be here anymore.   Pt reports a diagnosis of bipolar disorder and says that her symptoms have become worse since she has not taken her medications.  She currently denies SI/HI while in the ED.  She denies auditory/visual hallucinations.  She denies drug/alcohol use.  Diagnosis: Depression, SI  Past Medical History:  Past Medical History:  Diagnosis Date  . Bipolar 1 disorder (HCC)   . Depression   . Headache   . PCO (polycystic ovaries)     Past Surgical History:  Procedure Laterality Date  . ccy  06/09/2014   pt states she does not remember this surgery  . CHOLECYSTECTOMY    . TONSILLECTOMY AND ADENOIDECTOMY    . WISDOM TOOTH EXTRACTION      Family History:  Family History  Problem Relation Age of Onset  . Cancer Mother        cervical  . COPD Mother   . Hyperlipidemia Father   . Hypertension Father   . Migraines Father   . Bipolar disorder Sister   . Heart disease Maternal Grandmother   . COPD Maternal Grandmother   . Heart disease Maternal Grandfather   . Hypertension Paternal Grandmother   . Heart disease Paternal Grandmother   . Hypertension Paternal Grandfather   . Heart disease Paternal Grandfather   . Alzheimer's disease Paternal Grandfather   . Diabetes Neg Hx     Social History:  reports that she has never smoked. She has never used smokeless tobacco. She reports that she does not drink alcohol or use drugs.  Additional Social History:  Alcohol / Drug Use Pain Medications: See PTA Prescriptions: See PTA Over the Counter: See PTA History of alcohol / drug use?: No history of alcohol / drug abuse  CIWA: CIWA-Ar BP: (!) 134/92 Pulse  Rate: (!) 107 COWS:    Allergies: No Known Allergies  Home Medications:  (Not in a hospital admission)  OB/GYN Status:  No LMP recorded. Patient is not currently having periods (Reason: IUD).  General Assessment Data Location of Assessment: [facility] ED TTS Assessment: In system Is this a Tele or Face-to-Face Assessment?: Face-to-Face Is this an Initial Assessment or a Re-assessment for this encounter?: Initial Assessment Marital status: Single Maiden name: n/a Is patient pregnant?: No Pregnancy Status: No Living Arrangements: Alone Can pt return to current living arrangement?: Yes Admission Status: Involuntary Is patient capable of signing voluntary admission?: No Referral Source: Psychiatrist Insurance type: BCBS     Crisis Care Plan Living Arrangements: Alone Legal Guardian: Other: (self) Name of Psychiatrist: [facility] Name of Therapist: [facility]  Education Status Is patient currently in school?: No Current Grade: na Highest grade of school patient has completed: 12 Name of school: na Contact person: na  Risk to self with the past 6 months Suicidal Ideation: Yes-Currently Present Has patient been a risk to self within the past 6 months prior to admission? : No Suicidal Intent: No Has patient had any suicidal intent within the past 6 months prior to admission? : No Is patient at risk for suicide?: No Suicidal Plan?: No Has patient had any suicidal plan within the past 6 months prior to admission? :  No Access to Means: No What has been your use of drugs/alcohol within the last 12 months?: pt denies drug/alcohol use Previous Attempts/Gestures: No Other Self Harm Risks: none identified Triggers for Past Attempts: None known Intentional Self Injurious Behavior: None Family Suicide History: No Recent stressful life event(s): Job Loss, Financial Problems Persecutory voices/beliefs?: No Depression: Yes Depression Symptoms: Loss of interest in usual pleasures,  Despondent Substance abuse history and/or treatment for substance abuse?: No Suicide prevention information given to non-admitted patients: Not applicable  Risk to Others within the past 6 months Homicidal Ideation: No Does patient have any lifetime risk of violence toward others beyond the six months prior to admission? : No Thoughts of Harm to Others: No Current Homicidal Intent: No Current Homicidal Plan: No Access to Homicidal Means: No Identified Victim: none identified History of harm to others?: No Assessment of Violence: None Noted Violent Behavior Description: none identified Does patient have access to weapons?: No Criminal Charges Pending?: No Does patient have a court date: No Is patient on probation?: No  Psychosis Hallucinations: None noted Delusions: None noted  Mental Status Report Appearance/Hygiene: In scrubs Eye Contact: Good Motor Activity: Freedom of movement Speech: Logical/coherent Level of Consciousness: Quiet/awake Mood: Anxious Affect: Anxious, Appropriate to circumstance Anxiety Level: Minimal Thought Processes: Relevant Judgement: Partial Orientation: Place, Person, Time, Situation Obsessive Compulsive Thoughts/Behaviors: None  Cognitive Functioning Concentration: Normal Memory: Recent Intact, Remote Intact IQ: Average Insight: Fair Impulse Control: Fair Appetite: Good Sleep: No Change Vegetative Symptoms: None  ADLScreening [facility] Assessment Services) Patient's cognitive ability adequate to safely complete daily activities?: Yes Patient able to express need for assistance with ADLs?: Yes Independently performs ADLs?: Yes (appropriate for developmental age)  Prior Inpatient Therapy Prior Inpatient Therapy: No Prior Therapy Dates: na Prior Therapy Facilty/Provider(s): na Reason for Treatment: na  Prior Outpatient Therapy Prior Outpatient Therapy: Yes Prior Therapy Dates: current Prior Therapy Facilty/Provider(s): [facility] Reason for  Treatment: depression Does patient have an ACCT team?: No Does patient have Intensive In-House Services?  : No Does patient have [facility] services? : No Does patient have P4CC services?: No  ADL Screening (condition at time of admission) Patient's cognitive ability adequate to safely complete daily activities?: Yes Patient able to express need for assistance with ADLs?: Yes Independently performs ADLs?: Yes (appropriate for developmental age)       Abuse/Neglect Assessment (Assessment to be complete while patient is alone) Physical Abuse: Denies Verbal Abuse: Denies Sexual Abuse: Denies Exploitation of patient/patient's resources: Denies Self-Neglect: Denies Values / Beliefs Cultural Requests During Hospitalization: None Spiritual Requests During Hospitalization: None Consults Spiritual Care Consult Needed: No Social Work Consult Needed: No Merchant navy officer (For Healthcare) Does Patient Have a Medical Advance Directive?: No    Additional Information 1:1 In Past 12 Months?: No CIRT Risk: No Elopement Risk: No Does patient have medical clearance?: Yes     Disposition:  Disposition Initial Assessment Completed for this Encounter: Yes Disposition of Patient: Re-evaluation by Psychiatry recommended  On Site Evaluation by:   Reviewed with Physician:    [place] 06/11/2017 3:02 AM

## 2017-06-11 NOTE — ED Provider Notes (Signed)
discussed with psychiatry Dr. Toni Amend after his evaluation. Feels the patient is psychiatrically stable. She remains medically stable with normal vital signs. Discharge home to follow up with primary care and outpatient psychiatry.   Sharman Cheek, MD 06/11/17 1143

## 2017-06-11 NOTE — ED Notes (Signed)
Pt ivc/ pending psych consult  

## 2017-06-11 NOTE — Consult Note (Signed)
[facility] Consult   Reason for Consult:  Consult for 31 year old woman with a history of mood disorder who came into the hospital referred from [place] because of acute suicidal thoughts Referring Physician:  Jimmye Norman Patient Identification: KIVA NORLAND MRN:  [id number] Principal Diagnosis: Adjustment disorder with mixed disturbance of emotions and conduct Diagnosis:   Patient Active Problem List   Diagnosis Date Noted  . Adjustment disorder with mixed disturbance of emotions and conduct [F43.25] 06/11/2017  . Bipolar 2 disorder ([place]) [F31.81] 06/11/2017  . Tingling [R20.2] 05/28/2017  . Idiopathic intracranial hypertension [G93.2] 05/25/2017  . Papilledema [H47.10] 05/01/2017  . Headache [R51] 05/01/2017  . Neck pain [M54.2] 05/01/2017  . Pelvic pain in female [R10.2] 05/20/2016  . Hypocalcemia [E83.51] 03/11/2016  . Hyperinsulinemia [E16.1] 03/11/2016  . PCO (polycystic ovaries) [E28.2]   . Family planning [Z30.09] 12/18/2015  . Depression [F32.9] 12/18/2015  . Obesity [E66.9] 10/17/2015  . Migraine [G43.909] 10/17/2015  . Cholelithiasis [K80.20] 10/16/2015    Total Time spent with patient: 1 hour  Subjective:   Alexis Rangel is a 31 y.o. female patient admitted with "I had a bad day yesterday".  HPI:  Patient interviewed chart reviewed. Patient says she went to work as usual yesterday morning and soon after starting work developed a headache. She also had a disagreement with a coworker that got her emotionally upset. Her headache got worse then and she ask if she could go home. She was under the impression that she had been given permission to go home by her supervisor but when she got home she was called on the telephone and told that she was fired. Patient came upset. Admits that she had thoughts about suicide. Says she had thoughts about driving her car off the road. Did not act on it and did not do anything to harm herself. She notified a friend who  then got mobile crisis to see her and they took her to [place] who sent her to see [place]. Patient has been here overnight and says she is feeling much better. She no longer is feeling sad or anxious or upset. She says that she feels confident that she can find another job. Prior to this incident yesterday she had not been having any recent depression. Mood had been stable. Sleep had been stable. She is currently taking medication prescribed by her primary care doctor. She had not been drinking or abusing any drugs. Denies any hallucinations. She is agreeable to regular outpatient treatment and has good support.  Medical history: Overweight. Otherwise medically appears to be fairly healthy. No acute injury.  Substance abuse history: Says that she drinks occasionally but has no history of alcohol abuse and does not use any other drugs.  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 She lives with her boyfriend and his teenage children. She says it is a supportive environment and she has spoken to him since this incident happened yesterday. She feels safe going back home.  Past Psychiatric History: Patient has seen mental health providers in the past. She is currently on medication prescribed by her primary care doctor which is duloxetine and Cymbalta. She has no prior psychiatric admissions and denies ever trying to kill her self in the past and denies any history of self injury. She says she has been diagnosed with bipolar disorder.  Risk to Self: Suicidal Ideation: Yes-Currently Present Suicidal Intent: No Is patient at risk for suicide?: No Suicidal Plan?:  No Access to Means: No What has been your use of drugs/alcohol within the last 12 months?: pt denies drug/alcohol use Other Self Harm Risks: none identified Triggers for Past Attempts: None known Intentional Self Injurious Behavior: None Risk to Others: Homicidal Ideation: No Thoughts of Harm to Others:  No Current Homicidal Intent: No Current Homicidal Plan: No Access to Homicidal Means: No Identified Victim: none identified History of harm to others?: No Assessment of Violence: None Noted Violent Behavior Description: none identified Does patient have access to weapons?: No Criminal Charges Pending?: No Does patient have a court date: No Prior Inpatient Therapy: Prior Inpatient Therapy: No Prior Therapy Dates: na Prior Therapy Facilty/Provider(s): na Reason for Treatment: na Prior Outpatient Therapy: Prior Outpatient Therapy: Yes Prior Therapy Dates: current Prior Therapy Facilty/Provider(s): [facility] Reason for Treatment: depression Does patient have an ACCT team?: No Does patient have Intensive In-House Services?  : No Does patient have [facility] services? : No Does patient have P4CC services?: No  Past Medical History:  Past Medical History:  Diagnosis Date  . Bipolar 1 disorder ([place])   . Depression   . Headache   . PCO (polycystic ovaries)     Past Surgical History:  Procedure Laterality Date  . ccy  06/09/2014   pt states she does not remember this surgery  . CHOLECYSTECTOMY    . TONSILLECTOMY AND ADENOIDECTOMY    . WISDOM TOOTH EXTRACTION     Family History:  Family History  Problem Relation Age of Onset  . Cancer Mother        cervical  . COPD Mother   . Hyperlipidemia Father   . Hypertension Father   . Migraines Father   . Bipolar disorder Sister   . Heart disease Maternal Grandmother   . COPD Maternal Grandmother   . Heart disease Maternal Grandfather   . Hypertension Paternal Grandmother   . Heart disease Paternal Grandmother   . Hypertension Paternal Grandfather   . Heart disease Paternal Grandfather   . Alzheimer's disease Paternal Grandfather   . Diabetes Neg Hx    Family Psychiatric  History: Both her sister and mother have bipolar disorder Social History:  History  Alcohol Use No     History  Drug Use No    Social History   Social History   . Marital status: Single    Spouse name: N/A  . Number of children: N/A  . Years of education: N/A   Social History Main Topics  . Smoking status: Never Smoker  . Smokeless tobacco: Never Used  . Alcohol use No  . Drug use: No  . Sexual activity: Yes    Birth control/ protection: IUD     Comment: kyleena   Other Topics Concern  . None   Social History Narrative  . None   Additional Social History:    Allergies:  No Known Allergies  Labs:  Results for orders placed or performed during the hospital encounter of 06/10/17 (from the past 48 hour(s))  Comprehensive metabolic panel     Status: Abnormal   Collection Time: 06/10/17  7:15 PM  Result Value Ref Range   Sodium 137 135 - 145 mmol/L   Potassium 4.4 3.5 - 5.1 mmol/L   Chloride 104 101 - 111 mmol/L   CO2 24 22 - 32 mmol/L   Glucose, Bld 112 (H) 65 - 99 mg/dL   BUN 6 6 - 20 mg/dL   Creatinine, Ser 0.56 0.44 - 1.00 mg/dL   Calcium 9.1  8.9 - 10.3 mg/dL   Total Protein 8.2 (H) 6.5 - 8.1 g/dL   Albumin 4.1 3.5 - 5.0 g/dL   AST 22 15 - 41 U/L   ALT 25 14 - 54 U/L   Alkaline Phosphatase 96 38 - 126 U/L   Total Bilirubin 0.5 0.3 - 1.2 mg/dL   GFR calc non Af Amer >60 >60 mL/min   GFR calc Af Amer >60 >60 mL/min    Comment: (NOTE) The eGFR has been calculated using the CKD EPI equation. This calculation has not been validated in all clinical situations. eGFR's persistently <60 mL/min signify possible Chronic Kidney Disease.    Anion gap 9 5 - 15  Ethanol     Status: None   Collection Time: 06/10/17  7:15 PM  Result Value Ref Range   Alcohol, Ethyl (B) <10 <10 mg/dL    Comment:        LOWEST DETECTABLE LIMIT FOR SERUM ALCOHOL IS 10 mg/dL FOR MEDICAL PURPOSES ONLY   Salicylate level     Status: None   Collection Time: 06/10/17  7:15 PM  Result Value Ref Range   Salicylate Lvl <5.3 2.8 - 30.0 mg/dL  Acetaminophen level     Status: Abnormal   Collection Time: 06/10/17  7:15 PM  Result Value Ref Range    Acetaminophen (Tylenol), Serum <10 (L) 10 - 30 ug/mL    Comment:        THERAPEUTIC CONCENTRATIONS VARY SIGNIFICANTLY. A RANGE OF 10-30 ug/mL MAY BE AN EFFECTIVE CONCENTRATION FOR MANY PATIENTS. HOWEVER, SOME ARE BEST TREATED AT CONCENTRATIONS OUTSIDE THIS RANGE. ACETAMINOPHEN CONCENTRATIONS >150 ug/mL AT 4 HOURS AFTER INGESTION AND >50 ug/mL AT 12 HOURS AFTER INGESTION ARE OFTEN ASSOCIATED WITH TOXIC REACTIONS.   cbc     Status: Abnormal   Collection Time: 06/10/17  7:15 PM  Result Value Ref Range   WBC 17.7 (H) 3.6 - 11.0 K/uL   RBC 5.02 3.80 - 5.20 MIL/uL   Hemoglobin 14.2 12.0 - 16.0 g/dL   HCT 42.1 35.0 - 47.0 %   MCV 83.9 80.0 - 100.0 fL   MCH 28.4 26.0 - 34.0 pg   MCHC 33.8 32.0 - 36.0 g/dL   RDW 14.7 (H) 11.5 - 14.5 %   Platelets 480 (H) 150 - 440 K/uL  Urine Drug Screen, Qualitative     Status: Abnormal   Collection Time: 06/10/17  7:15 PM  Result Value Ref Range   Tricyclic, Ur Screen POSITIVE (A) NONE DETECTED   Amphetamines, Ur Screen NONE DETECTED NONE DETECTED   MDMA (Ecstasy)Ur Screen NONE DETECTED NONE DETECTED   Cocaine Metabolite,Ur [place]ine NONE DETECTED NONE DETECTED   Opiate, Ur Screen NONE DETECTED NONE DETECTED   Phencyclidine (PCP) Ur S NONE DETECTED NONE DETECTED   Cannabinoid 50 Ng, Ur [place] NONE DETECTED NONE DETECTED   Barbiturates, Ur Screen NONE DETECTED NONE DETECTED   Benzodiazepine, Ur Scrn NONE DETECTED NONE DETECTED   Methadone Scn, Ur NONE DETECTED NONE DETECTED    Comment: (NOTE) 794  Tricyclics, urine               Cutoff 1000 ng/mL 200  Amphetamines, urine             Cutoff 1000 ng/mL 300  MDMA (Ecstasy), urine           Cutoff 500 ng/mL 400  Cocaine Metabolite, urine       Cutoff 300 ng/mL 500  Opiate, urine  Cutoff 300 ng/mL 600  Phencyclidine (PCP), urine      Cutoff 25 ng/mL 700  Cannabinoid, urine              Cutoff 50 ng/mL 800  Barbiturates, urine             Cutoff 200 ng/mL 900  Benzodiazepine, urine            Cutoff 200 ng/mL 1000 Methadone, urine                Cutoff 300 ng/mL 1100 1200 The urine drug screen provides only a preliminary, unconfirmed 1300 analytical test result and should not be used for non-medical 1400 purposes. Clinical consideration and professional judgment should 1500 be applied to any positive drug screen result due to possible 1600 interfering substances. A more specific alternate chemical method 1700 must be used in order to obtain a confirmed analytical result.  1800 Gas chromato graphy / mass spectrometry (GC/MS) is the preferred 1900 confirmatory method.   Pregnancy, urine POC     Status: None   Collection Time: 06/10/17  7:30 PM  Result Value Ref Range   Preg Test, Ur NEGATIVE NEGATIVE    Comment:        THE SENSITIVITY OF THIS METHODOLOGY IS >24 mIU/mL     No current facility-administered medications for this encounter.    Current Outpatient Prescriptions  Medication Sig Dispense Refill  . acetaminophen (TYLENOL) 500 MG tablet Take 500 mg by mouth every 6 (six) hours as needed.    . cyclobenzaprine (FLEXERIL) 10 MG tablet Take 10 mg by mouth daily as needed.   0  . DULoxetine (CYMBALTA) 30 MG capsule Take 1 capsule (30 mg total) by mouth daily. 30 capsule 2  . HYDROcodone-acetaminophen (NORCO/VICODIN) 5-325 MG tablet Take 1 tablet by mouth every 6 (six) hours as needed for moderate pain. 20 tablet 0  . Levonorgestrel (KYLEENA) 19.5 MG IUD 1 Device by Intrauterine route once. 1 Intra Uterine Device 0  . methylphenidate (CONCERTA) 18 MG PO CR tablet Take 1 tablet (18 mg total) by mouth daily. 30 tablet 0  . nortriptyline (PAMELOR) 25 MG capsule Take 1 capsule (25 mg total) by mouth at bedtime. 30 capsule 3  . topiramate (TOPAMAX) 100 MG tablet Take 100 mg by mouth daily.  0    Musculoskeletal: Strength & Muscle Tone: within normal limits Gait & Station: normal Patient leans: N/A  Psychiatric Specialty Exam: Physical Exam  Nursing note and vitals  reviewed. Constitutional: She appears well-developed and well-nourished.  HENT:  Head: Normocephalic and atraumatic.  Eyes: Pupils are equal, round, and reactive to light. Conjunctivae are normal.  Neck: Normal range of motion.  Cardiovascular: Regular rhythm and normal heart sounds.   Respiratory: Effort normal.  GI: Soft.  Musculoskeletal: Normal range of motion.  Neurological: She is alert.  Skin: Skin is warm and dry.  Psychiatric: She has a normal mood and affect. Her speech is normal and behavior is normal. Judgment and thought content normal. Cognition and memory are normal.    Review of Systems  Constitutional: Negative.   HENT: Negative.   Eyes: Negative.   Respiratory: Negative.   Cardiovascular: Negative.   Gastrointestinal: Negative.   Musculoskeletal: Negative.   Skin: Negative.   Neurological: Negative.   Psychiatric/Behavioral: Positive for depression. Negative for hallucinations, memory loss, substance abuse and suicidal ideas. The patient is nervous/anxious. The patient does not have insomnia.     Blood pressure 134/76, pulse 86, temperature  76 F (37.2 C), temperature source Oral, resp. rate 18, height '5\' 2"'  (1.575 m), weight 98.4 kg (217 lb), SpO2 99 %.Body mass index is 39.69 kg/m.  General Appearance: Casual  Eye Contact:  Good  Speech:  Normal Rate  Volume:  Normal  Mood:  Euthymic  Affect:  Appropriate  Thought Process:  Goal Directed  Orientation:  Full (Time, Place, and Person)  Thought Content:  Logical  Suicidal Thoughts:  No  Homicidal Thoughts:  No  Memory:  Immediate;   Good Recent;   Good Remote;   Fair  Judgement:  Fair  Insight:  Fair  Psychomotor Activity:  Normal  Concentration:  Concentration: Good  Recall:  [place] of Knowledge:  Fair  Language:  Fair  Akathisia:  No  Handed:  Right  AIMS (if indicated):     Assets:  Communication Skills Desire for Improvement Housing Physical Health Resilience Social Support  ADL's:   Intact  Cognition:  WNL  Sleep:        Treatment Plan Summary: Plan This is a 31 year old woman with a history of mood disorder who had a stressful incident yesterday and had transient thoughts about suicide but instead of acting on it contacted care providers and cooperated appropriately. She is not abusing drugs and does not have a past history of suicide attempts. She is currently calm and rational and appropriate and stating a plan for follow-up treatment. Patient is no longer committable. She does not want to be admitted to the psychiatric hospital. Patient will be taken off of her IVC and can be discharged from the emergency room. Continue current medicines. Supportive counseling provided. She can be given information about [facility] to switch to seeing a mental health provider if she wishes.  Disposition: Patient does not meet criteria for psychiatric inpatient admission. Supportive therapy provided about ongoing stressors.  Alethia Berthold, MD 06/11/2017 5:09 PM

## 2017-06-11 NOTE — ED Notes (Signed)
Pt discharged to lobby. All belongings returned to patient. Pt attempted to sign for discharge paperwork - not working. Denies SI/HI and AVH. VS stable. Denies pain.

## 2017-06-11 NOTE — Telephone Encounter (Signed)
Noted. Agree with below

## 2017-06-11 NOTE — ED Notes (Signed)
Pt calm and cooperative. Pt stated she lost her job yesterday and is emotional, but has no intentions of hurting herself.  Pt became tearful telling this Clinical research associate she wants to be released so she can begin looking for a new job. RN explained she would speak with Dr. Toni Amend this morning and he would decide the plan. Pt accepting. Maintained on 15 minute checks and observation by security camera for safety.

## 2017-06-11 NOTE — ED Notes (Signed)
Pt to be discharged today per psychiatry.

## 2017-06-18 ENCOUNTER — Ambulatory Visit: Payer: BLUE CROSS/BLUE SHIELD | Admitting: Neurology

## 2017-06-22 ENCOUNTER — Encounter: Payer: Self-pay | Admitting: Family Medicine

## 2017-06-22 ENCOUNTER — Telehealth: Payer: Self-pay | Admitting: Family Medicine

## 2017-06-22 ENCOUNTER — Ambulatory Visit (INDEPENDENT_AMBULATORY_CARE_PROVIDER_SITE_OTHER): Payer: BLUE CROSS/BLUE SHIELD | Admitting: Family Medicine

## 2017-06-22 VITALS — BP 130/87 | HR 105 | Temp 98.7°F | Wt 220.4 lb

## 2017-06-22 DIAGNOSIS — Z23 Encounter for immunization: Secondary | ICD-10-CM | POA: Diagnosis not present

## 2017-06-22 DIAGNOSIS — F3181 Bipolar II disorder: Secondary | ICD-10-CM

## 2017-06-22 DIAGNOSIS — G43109 Migraine with aura, not intractable, without status migrainosus: Secondary | ICD-10-CM | POA: Diagnosis not present

## 2017-06-22 MED ORDER — LURASIDONE HCL 20 MG PO TABS: 20.0000 mg | ORAL_TABLET | Freq: Every day | ORAL | 3 refills | Status: DC

## 2017-06-22 NOTE — Progress Notes (Signed)
BP 130/87 (BP Location: Left Arm, Patient Position: Sitting, Cuff Size: Large)   Pulse (!) 105   Temp 98.7 F (37.1 C)   Wt 220 lb 7 oz (100 kg)   SpO2 100%   BMI 40.32 kg/m    Subjective:    Patient ID: Alexis Rangel, female    DOB: 05-22-1986, 31 y.o.   MRN: [id number]  HPI: Alexis Rangel is a 31 y.o. female  Chief Complaint  Patient presents with  . Headache  . Manic Behavior   ER FOLLOW UP Time since discharge: 11 days Hospital/facility: [facility] Diagnosis: suicidal thoughts, depression Procedures/tests: none Consultants: psychiatry New medications: none Discharge instructions: follow up here, number for [facility] given  Status: better  MIGRAINES Duration: chronic Onset: sudden Severity: severe Quality: sharp and throbbing Frequency: constant Location: starts at the back of her head and then goes up covers her whole head Headache duration: several days Radiation: yes Alleviating factors: extra-strength tylenol, sleeping Aggravating factors: lifting or being stressed Headache status at time of visit: current headache Treatments attempted: Treatments attempted: rest, APAP, ibuprofen, aleve", excedrine and amitriptyline, topamax, cymbalta   Aura: yes- blurred during the headaches Nausea:  yes Vomiting: no Photophobia:  yes Phonophobia:  yes Effect on social functioning:  yes Numbers of missed days of school/work each month: 3-4x a month Confusion:  yes Gait disturbance/ataxia:  yes Behavioral changes:  yes Fevers:  no  DEPRESSION- lost her job due to her migraines, and felt really poorly with that. Had some thoughts of suicide and went to the ER. Was in the ER 06/10/17 Mood status: uncontrolled Satisfied with current treatment?: no Symptom severity: moderate  Duration of current treatment : months Side effects: no Medication compliance: excellent compliance Psychotherapy/counseling: no  Previous psychiatric medications: topamax, cymbalta,  nortriptyline Depressed mood: yes Anxious mood: yes Anhedonia: no Significant weight loss or gain: no Insomnia: yes hard to stay asleep Fatigue: yes Feelings of worthlessness or guilt: yes Impaired concentration/indecisiveness: yes Suicidal ideations: no Hopelessness: no Crying spells: yes Depression screen [facility] 2/9 06/22/2017 04/21/2017 12/18/2015  Decreased Interest 3 2 1   Down, Depressed, Hopeless 3 3 2   PHQ - 2 Score 6 5 3   Altered sleeping 3 1 1   Tired, decreased energy 2 2 2   Change in appetite 3 3 3   Feeling bad or failure about yourself  3 2 2   Trouble concentrating 2 2 2   Moving slowly or fidgety/restless 2 1 2   Suicidal thoughts 3 1 0  PHQ-9 Score 24 17 15   Difficult doing work/chores Extremely dIfficult - -    Relevant past medical, surgical, family and social history reviewed and updated as indicated. Interim medical history since our last visit reviewed. Allergies and medications reviewed and updated.  Review of Systems  Constitutional: Negative.   Respiratory: Negative.   Cardiovascular: Negative.   Musculoskeletal: Negative.   Neurological: Positive for dizziness, light-headedness, numbness and headaches. Negative for tremors, seizures, syncope, facial asymmetry, speech difficulty and weakness.  Psychiatric/Behavioral: Positive for dysphoric mood and sleep disturbance. Negative for agitation, behavioral problems, confusion, decreased concentration, hallucinations, self-injury and suicidal ideas. The patient is nervous/anxious. The patient is not hyperactive.     Per HPI unless specifically indicated above     Objective:    BP 130/87 (BP Location: Left Arm, Patient Position: Sitting, Cuff Size: Large)   Pulse (!) 105   Temp 98.7 F (37.1 C)   Wt 220 lb 7 oz (100 kg)   SpO2 100%   BMI  40.32 kg/m   Wt Readings from Last 3 Encounters:  06/22/17 220 lb 7 oz (100 kg)  06/10/17 217 lb (98.4 kg)  06/01/17 217 lb (98.4 kg)    Physical Exam  Constitutional:  She is oriented to person, place, and time. She appears well-developed and well-nourished. No distress.  HENT:  Head: Normocephalic and atraumatic.  Right Ear: Hearing normal.  Left Ear: Hearing normal.  Nose: Nose normal.  Eyes: Conjunctivae and lids are normal. Right eye exhibits no discharge. Left eye exhibits no discharge. No scleral icterus.  Cardiovascular: Normal rate, regular rhythm, normal heart sounds and intact distal pulses.  Exam reveals no gallop and no friction rub.   No murmur heard. Pulmonary/Chest: Effort normal and breath sounds normal. No respiratory distress. She has no wheezes. She has no rales. She exhibits no tenderness.  Musculoskeletal: Normal range of motion.  Neurological: She is alert and oriented to person, place, and time.  Skin: Skin is warm, dry and intact. No rash noted. She is not diaphoretic. No erythema. No pallor.  Psychiatric: Her speech is normal and behavior is normal. Judgment and thought content normal. Cognition and memory are normal. She exhibits a depressed mood.  Nursing note and vitals reviewed.   Results for orders placed or performed during the hospital encounter of 06/10/17  Comprehensive metabolic panel  Result Value Ref Range   Sodium 137 135 - 145 mmol/L   Potassium 4.4 3.5 - 5.1 mmol/L   Chloride 104 101 - 111 mmol/L   CO2 24 22 - 32 mmol/L   Glucose, Bld 112 (H) 65 - 99 mg/dL   BUN 6 6 - 20 mg/dL   Creatinine, Ser 1.61 0.44 - 1.00 mg/dL   Calcium 9.1 8.9 - 09.6 mg/dL   Total Protein 8.2 (H) 6.5 - 8.1 g/dL   Albumin 4.1 3.5 - 5.0 g/dL   AST 22 15 - 41 U/L   ALT 25 14 - 54 U/L   Alkaline Phosphatase 96 38 - 126 U/L   Total Bilirubin 0.5 0.3 - 1.2 mg/dL   GFR calc non Af Amer >60 >60 mL/min   GFR calc Af Amer >60 >60 mL/min   Anion gap 9 5 - 15  Ethanol  Result Value Ref Range   Alcohol, Ethyl (B) <10 <10 mg/dL  Salicylate level  Result Value Ref Range   Salicylate Lvl <7.0 2.8 - 30.0 mg/dL  Acetaminophen level  Result  Value Ref Range   Acetaminophen (Tylenol), Serum <10 (L) 10 - 30 ug/mL  cbc  Result Value Ref Range   WBC 17.7 (H) 3.6 - 11.0 K/uL   RBC 5.02 3.80 - 5.20 MIL/uL   Hemoglobin 14.2 12.0 - 16.0 g/dL   HCT 04.5 40.9 - 81.1 %   MCV 83.9 80.0 - 100.0 fL   MCH 28.4 26.0 - 34.0 pg   MCHC 33.8 32.0 - 36.0 g/dL   RDW 91.4 (H) 78.2 - 95.6 %   Platelets 480 (H) 150 - 440 K/uL  Urine Drug Screen, Qualitative  Result Value Ref Range   Tricyclic, Ur Screen POSITIVE (A) NONE DETECTED   Amphetamines, Ur Screen NONE DETECTED NONE DETECTED   MDMA (Ecstasy)Ur Screen NONE DETECTED NONE DETECTED   Cocaine Metabolite,Ur [place] NONE DETECTED NONE DETECTED   Opiate, Ur Screen NONE DETECTED NONE DETECTED   Phencyclidine (PCP) Ur S NONE DETECTED NONE DETECTED   Cannabinoid 50 Ng, Ur [place] NONE DETECTED NONE DETECTED   Barbiturates, Ur Screen NONE DETECTED NONE DETECTED  Benzodiazepine, Ur Scrn NONE DETECTED NONE DETECTED   Methadone Scn, Ur NONE DETECTED NONE DETECTED  Pregnancy, urine POC  Result Value Ref Range   Preg Test, Ur NEGATIVE NEGATIVE      Assessment & Plan:   Problem List Items Addressed This Visit      Cardiovascular and Mediastinum   Migraine    Stable. Continue to follow with neurology. Missed her last appointment with them, but has another around the first of the year. Continue to monitor. Call with any concerns.       Relevant Medications   acetaZOLAMIDE (DIAMOX) 500 MG capsule     Other   Bipolar 2 disorder (HCC) - Primary    Not under good control. Will start latuda and recheck 2 weeks. No plan on suicidality right now. Call with any concerns.        Other Visit Diagnoses    Immunization due       Flu shot given today.   Relevant Orders   Flu Vaccine QUAD 6+ mos PF IM (Fluarix Quad PF) (Completed)       Follow up plan: Return in about 2 weeks (around 07/06/2017) for Follow up mood.

## 2017-06-22 NOTE — Assessment & Plan Note (Signed)
Stable. Continue to follow with neurology. Missed her last appointment with them, but has another around the first of the year. Continue to monitor. Call with any concerns.

## 2017-06-22 NOTE — Assessment & Plan Note (Signed)
Not under good control. Will start latuda and recheck 2 weeks. No plan on suicidality right now. Call with any concerns.

## 2017-06-22 NOTE — Telephone Encounter (Signed)
Copied from CRM #[id number]. Topic: Quick Communication - See Telephone Encounter >> Jun 22, 2017  3:45 PM Arlyss Gandyichardson, Atticus Lemberger N, NT wrote: CRM for notification. See Telephone encounter for:  06/22/17. Patient calling to check status on her insurance company approving her to receive her meds from the pharmacy.

## 2017-06-22 NOTE — Patient Instructions (Addendum)

## 2017-06-23 MED ORDER — QUETIAPINE FUMARATE 25 MG PO TABS: 25.0000 mg | ORAL_TABLET | Freq: Every day | ORAL | 3 refills | Status: DC

## 2017-06-23 NOTE — Telephone Encounter (Signed)
Rx sent to her pharmacy 

## 2017-06-23 NOTE — Telephone Encounter (Signed)
Copied from CRM #[id number]. Topic: Quick Communication - Other Results >> Jun 22, 2017 12:46 PM Stephannie LiSimmons, Janett L, NT wrote:  [facility] to get  presciption for [place] ? Filled will wait for a while >> Jun 22, 2017  2:19 PM Marshall CorkBullock, Keri L, [place] wrote: [facility]. >> Jun 23, 2017 10:16 AM Marshall CorkBullock, Keri L, [place] wrote: Patient's Kasandra KnudsenLatuda was denied by insurance.  Routing to provider as Lorain ChildesFYI and to change medication.

## 2017-06-23 NOTE — Telephone Encounter (Signed)
Tried calling patient, no answer. The mailbox is full and cannot accept any more messages.

## 2017-06-23 NOTE — Telephone Encounter (Signed)
Looks like her latuda was declined. I can call in some seroquel and we can see how that works for her if she'd like.

## 2017-06-23 NOTE — Telephone Encounter (Signed)
Patients states that it is fine for you to send the seroquel.

## 2017-07-06 ENCOUNTER — Ambulatory Visit: Payer: BLUE CROSS/BLUE SHIELD | Admitting: Family Medicine

## 2017-07-07 ENCOUNTER — Ambulatory Visit: Payer: BLUE CROSS/BLUE SHIELD | Admitting: Family Medicine

## 2017-07-16 ENCOUNTER — Encounter: Payer: Self-pay | Admitting: Family Medicine

## 2017-07-16 ENCOUNTER — Ambulatory Visit (INDEPENDENT_AMBULATORY_CARE_PROVIDER_SITE_OTHER): Payer: Self-pay | Admitting: Family Medicine

## 2017-07-16 VITALS — BP 145/82 | HR 82 | Wt 240.0 lb

## 2017-07-16 DIAGNOSIS — F3181 Bipolar II disorder: Secondary | ICD-10-CM

## 2017-07-16 MED ORDER — QUETIAPINE FUMARATE 50 MG PO TABS: 50.0000 mg | ORAL_TABLET | Freq: Every day | ORAL | 1 refills | Status: DC

## 2017-07-16 MED ORDER — DULOXETINE HCL 60 MG PO CPEP: 60.0000 mg | ORAL_CAPSULE | Freq: Every day | ORAL | 1 refills | Status: DC

## 2017-07-16 NOTE — Assessment & Plan Note (Signed)
Doing better, but not quite there. Will increase her cymbalta to 60mg  and her seroquel to 50mg  and recheck 1 month. Call with any concerns.

## 2017-07-16 NOTE — Progress Notes (Signed)
BP (!) 145/82   Pulse 82   Wt 240 lb (108.9 kg)   SpO2 99%   BMI 43.90 kg/m    Subjective:    Patient ID: Alexis Rangel, female    DOB: [birth year], 31 y.o.   MRN: [id number]  HPI: Alexis Rangel is a 31 y.o. female  Chief Complaint  Patient presents with  . Mood  . Letter for School/Work   DEPRESSION Mood status: better Satisfied with current treatment?: no Symptom severity: moderate  Duration of current treatment : months Side effects: no Medication compliance: excellent compliance Psychotherapy/counseling: no  Previous psychiatric medications: seroquel, cymbalta, topamax, nortripityline Depressed mood: yes Anxious mood: yes Anhedonia: no Significant weight loss or gain: yes Insomnia: yes hard to stay asleep Fatigue: yes Feelings of worthlessness or guilt: yes Impaired concentration/indecisiveness: yes Suicidal ideations: yes Hopelessness: yes Crying spells: yes [facility] screen [facility] 2[facility] 07/16/2017 06/22/2017 04/21/2017 12/18/2015  Decreased Interest 2 3 2 1   Down, Depressed, Hopeless 3 3 3 2   PHQ - 2 Score 5 6 5 3   Altered sleeping 1 3 1 1   Tired, decreased energy 2 2 2 2   Change in appetite 3 3 3 3   Feeling bad or failure about yourself  3 3 2 2   Trouble concentrating 1 2 2 2   Moving slowly or fidgety/restless 2 2 1 2   Suicidal thoughts 2 3 1  0  PHQ-9 Score 19 24 17 15   Difficult doing work/chores Somewhat difficult Extremely dIfficult - -    Relevant past medical, surgical, family and social history reviewed and updated as indicated. Interim medical history since our last visit reviewed. Allergies and medications reviewed and updated.  Review of Systems  Constitutional: Negative.   Respiratory: Negative.   Cardiovascular: Negative.   Psychiatric/Behavioral: Positive for agitation and dysphoric mood. Negative for behavioral problems, confusion, decreased concentration, hallucinations, self-injury, sleep disturbance and suicidal ideas. The  patient is nervous/anxious. The patient is not hyperactive.     Per HPI unless specifically indicated above     Objective:    BP (!) 145/82   Pulse 82   Wt 240 lb (108.9 kg)   SpO2 99%   BMI 43.90 kg/m   Wt Readings from Last 3 Encounters:  07/16/17 240 lb (108.9 kg)  06/22/17 220 lb 7 oz (100 kg)  06/10/17 217 lb (98.4 kg)    Physical Exam  Constitutional: She is oriented to person, place, and time. She appears well-developed and well-nourished. No distress.  HENT:  Head: Normocephalic and atraumatic.  Right Ear: Hearing normal.  Left Ear: Hearing normal.  Nose: Nose normal.  Eyes: Conjunctivae and lids are normal. Right eye exhibits no discharge. Left eye exhibits no discharge. No scleral icterus.  Cardiovascular: Normal rate, regular rhythm, normal heart sounds and intact distal pulses. Exam reveals no gallop and no friction rub.  No murmur heard. Pulmonary/Chest: Effort normal and breath sounds normal. No respiratory distress. She has no wheezes. She has no rales. She exhibits no tenderness.  Musculoskeletal: Normal range of motion.  Neurological: She is alert and oriented to person, place, and time.  Skin: Skin is warm, dry and intact. No rash noted. She is not diaphoretic. No erythema. No pallor.  Psychiatric: She has a normal mood and affect. Her speech is normal and behavior is normal. Judgment and thought content normal. Cognition and memory are normal.  Nursing note and vitals reviewed.   Results for orders placed or performed during the hospital encounter of 06/10/17  Comprehensive  metabolic panel  Result Value Ref Range   Sodium 137 135 - 145 mmol/L   Potassium 4.4 3.5 - 5.1 mmol/L   Chloride 104 101 - 111 mmol/L   CO2 24 22 - 32 mmol/L   Glucose, Bld 112 (H) 65 - 99 mg/dL   BUN 6 6 - 20 mg/dL   Creatinine, Ser 4.090.56 0.44 - 1.00 mg/dL   Calcium 9.1 8.9 - 81.110.3 mg/dL   Total Protein 8.2 (H) 6.5 - 8.1 g/dL   Albumin 4.1 3.5 - 5.0 g/dL   AST 22 15 - 41 U/L    ALT 25 14 - 54 U/L   Alkaline Phosphatase 96 38 - 126 U/L   Total Bilirubin 0.5 0.3 - 1.2 mg/dL   GFR calc non Af Amer >60 >60 mL/min   GFR calc Af Amer >60 >60 mL/min   Anion gap 9 5 - 15  Ethanol  Result Value Ref Range   Alcohol, Ethyl (B) <10 <10 mg/dL  Salicylate level  Result Value Ref Range   Salicylate Lvl <7.0 2.8 - 30.0 mg/dL  Acetaminophen level  Result Value Ref Range   Acetaminophen (Tylenol), Serum <10 (L) 10 - 30 ug/mL  cbc  Result Value Ref Range   WBC 17.7 (H) 3.6 - 11.0 K/uL   RBC 5.02 3.80 - 5.20 MIL/uL   Hemoglobin 14.2 12.0 - 16.0 g/dL   HCT 91.442.1 78.235.0 - 95.647.0 %   MCV 83.9 80.0 - 100.0 fL   MCH 28.4 26.0 - 34.0 pg   MCHC 33.8 32.0 - 36.0 g/dL   RDW 21.314.7 (H) 08.611.5 - 57.814.5 %   Platelets 480 (H) 150 - 440 K/uL  Urine Drug Screen, Qualitative  Result Value Ref Range   Tricyclic, Ur Screen POSITIVE (A) NONE DETECTED   Amphetamines, Ur Screen NONE DETECTED NONE DETECTED   MDMA (Ecstasy)Ur Screen NONE DETECTED NONE DETECTED   Cocaine Metabolite,Ur [place] NONE DETECTED NONE DETECTED   Opiate, Ur Screen NONE DETECTED NONE DETECTED   Phencyclidine (PCP) Ur S NONE DETECTED NONE DETECTED   Cannabinoid 50 Ng, Ur [place] NONE DETECTED NONE DETECTED   Barbiturates, Ur Screen NONE DETECTED NONE DETECTED   Benzodiazepine, Ur Scrn NONE DETECTED NONE DETECTED   Methadone Scn, Ur NONE DETECTED NONE DETECTED  Pregnancy, urine POC  Result Value Ref Range   Preg Test, Ur NEGATIVE NEGATIVE      Assessment & Plan:   Problem List Items Addressed This Visit      Other   Bipolar 2 disorder (HCC) - Primary    Doing better, but not quite there. Will increase her cymbalta to 60mg  and her seroquel to 50mg  and recheck 1 month. Call with any concerns.           Follow up plan: Return in about 4 weeks (around 08/13/2017) for follow up mood.

## 2017-07-30 ENCOUNTER — Ambulatory Visit: Payer: Self-pay | Admitting: Family Medicine

## 2017-08-06 ENCOUNTER — Encounter: Payer: BLUE CROSS/BLUE SHIELD | Admitting: Obstetrics and Gynecology

## 2017-08-07 ENCOUNTER — Ambulatory Visit: Payer: BLUE CROSS/BLUE SHIELD | Admitting: Neurology

## 2017-08-11 IMAGING — DX DG HAND COMPLETE 3+V*L*
3 series · 3 of 3 positions shown · non-contrast
Comparison: None.

CLINICAL DATA: Laceration from nail, possible fracture or foreign
body

EXAM:
LEFT HAND - COMPLETE 3+ VIEW

[hand ap]
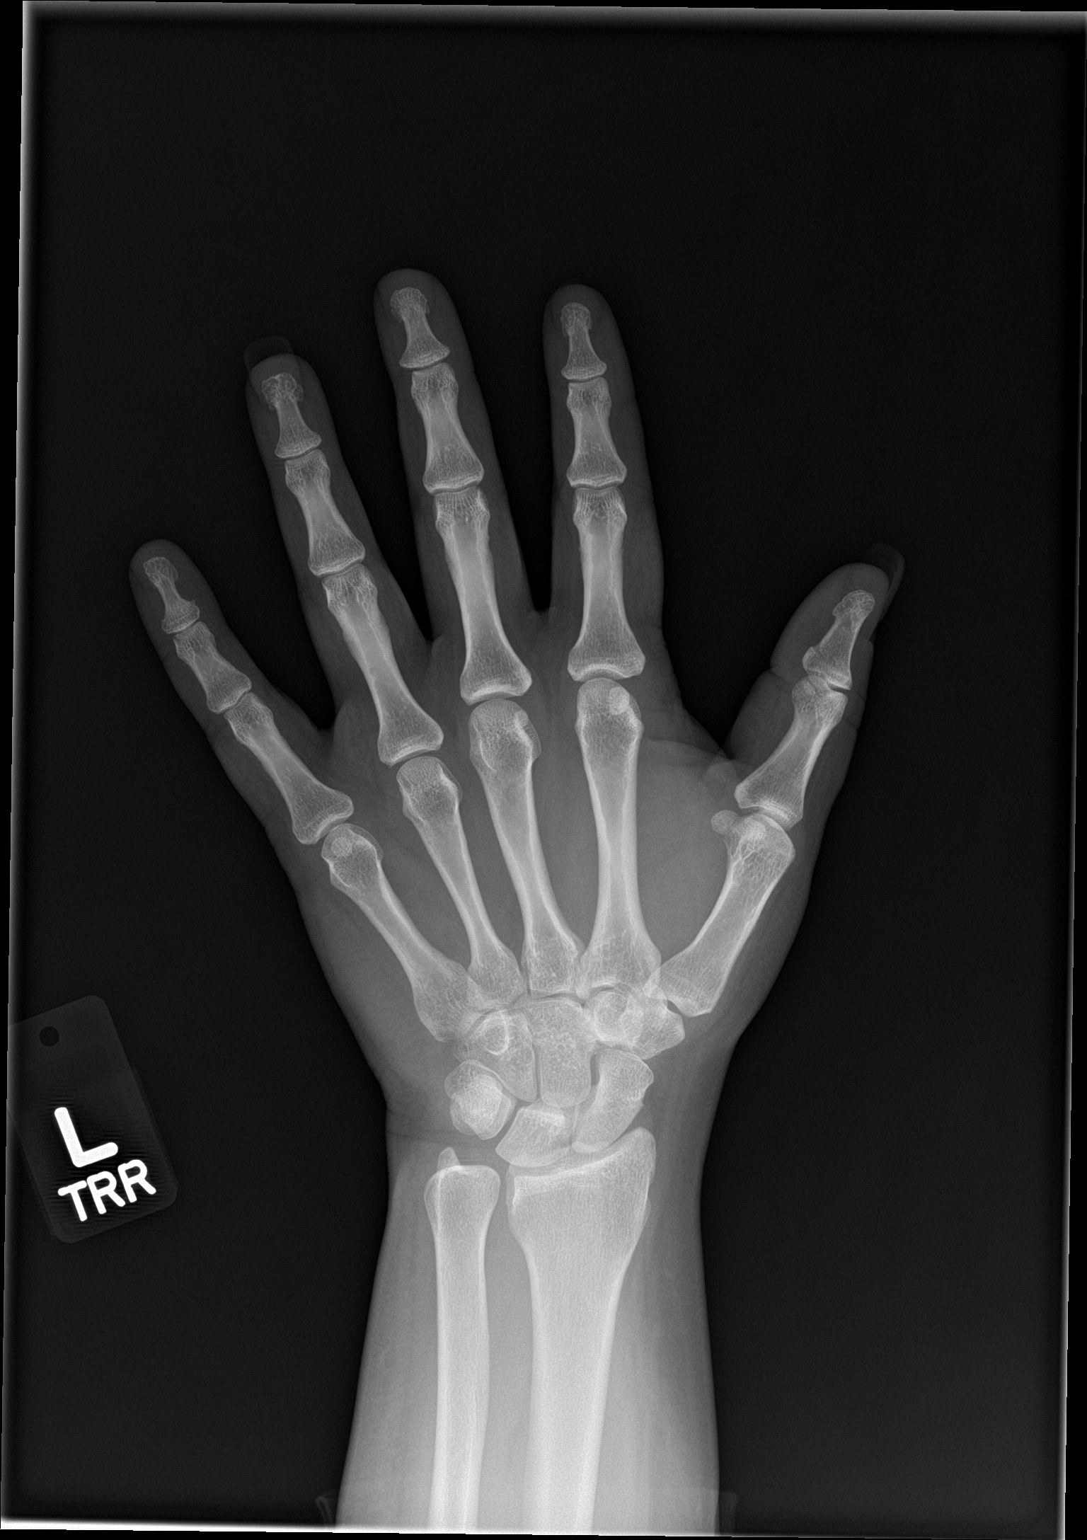

[hand obl]
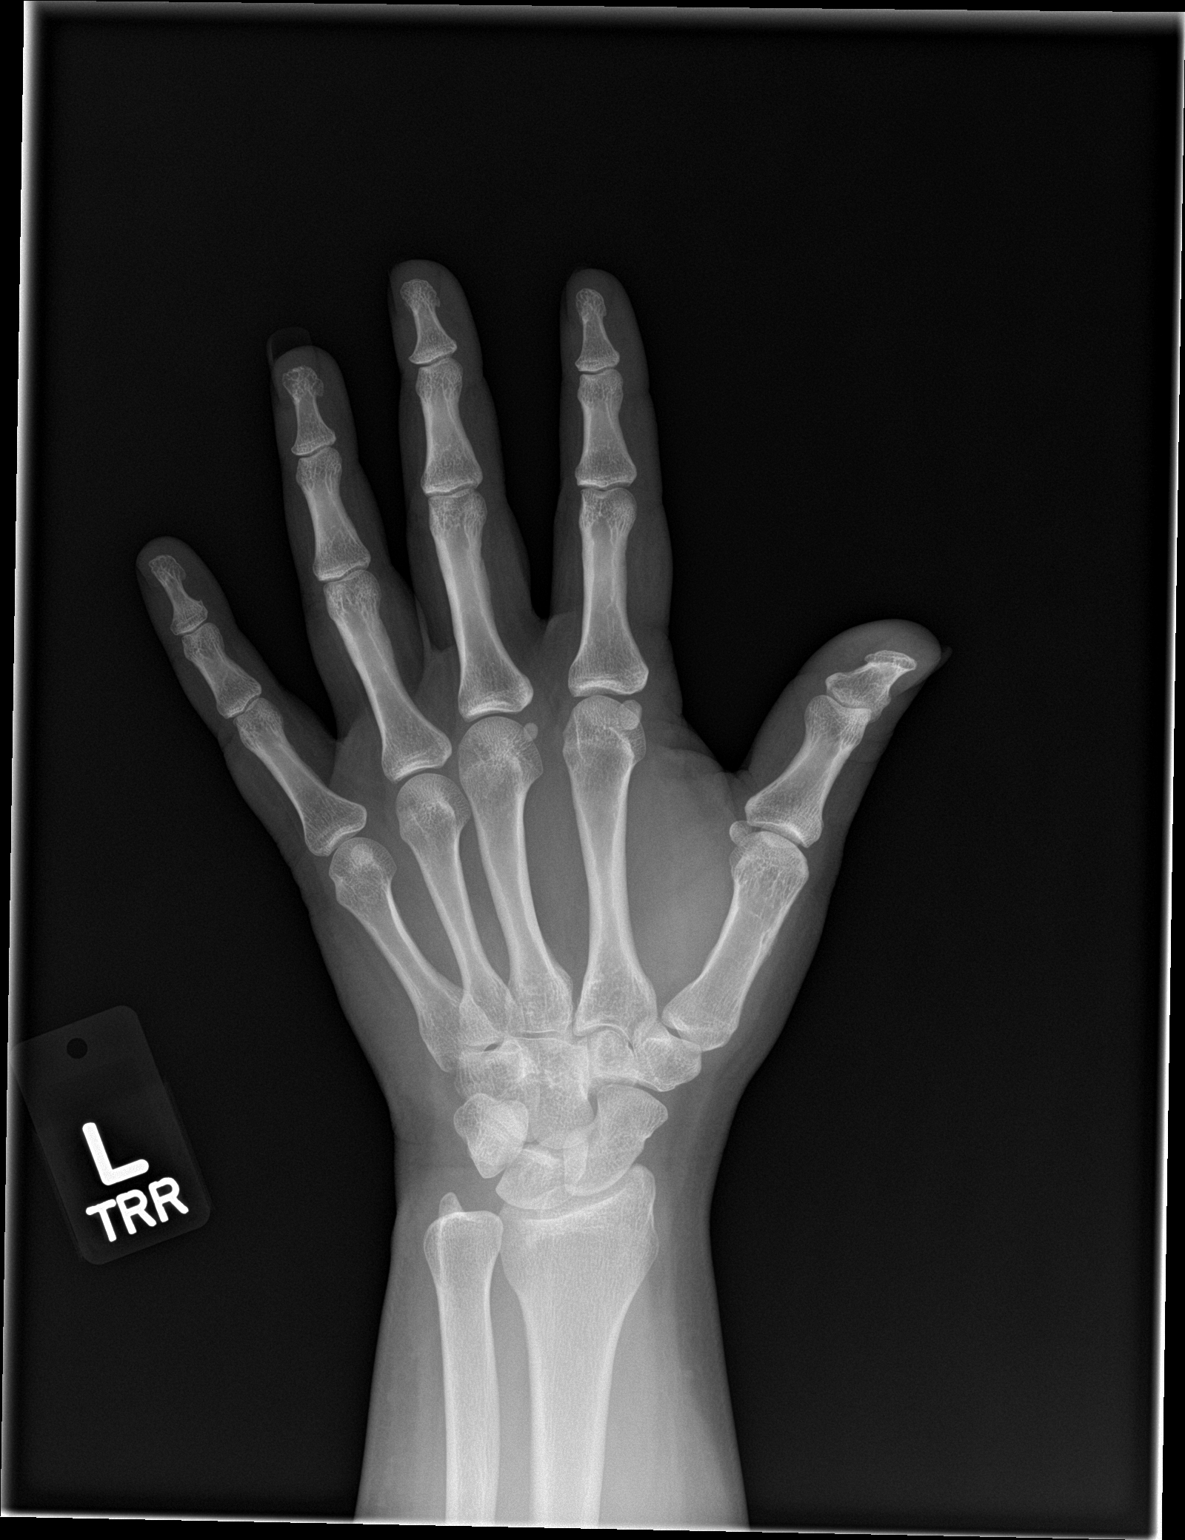

[hand lat]
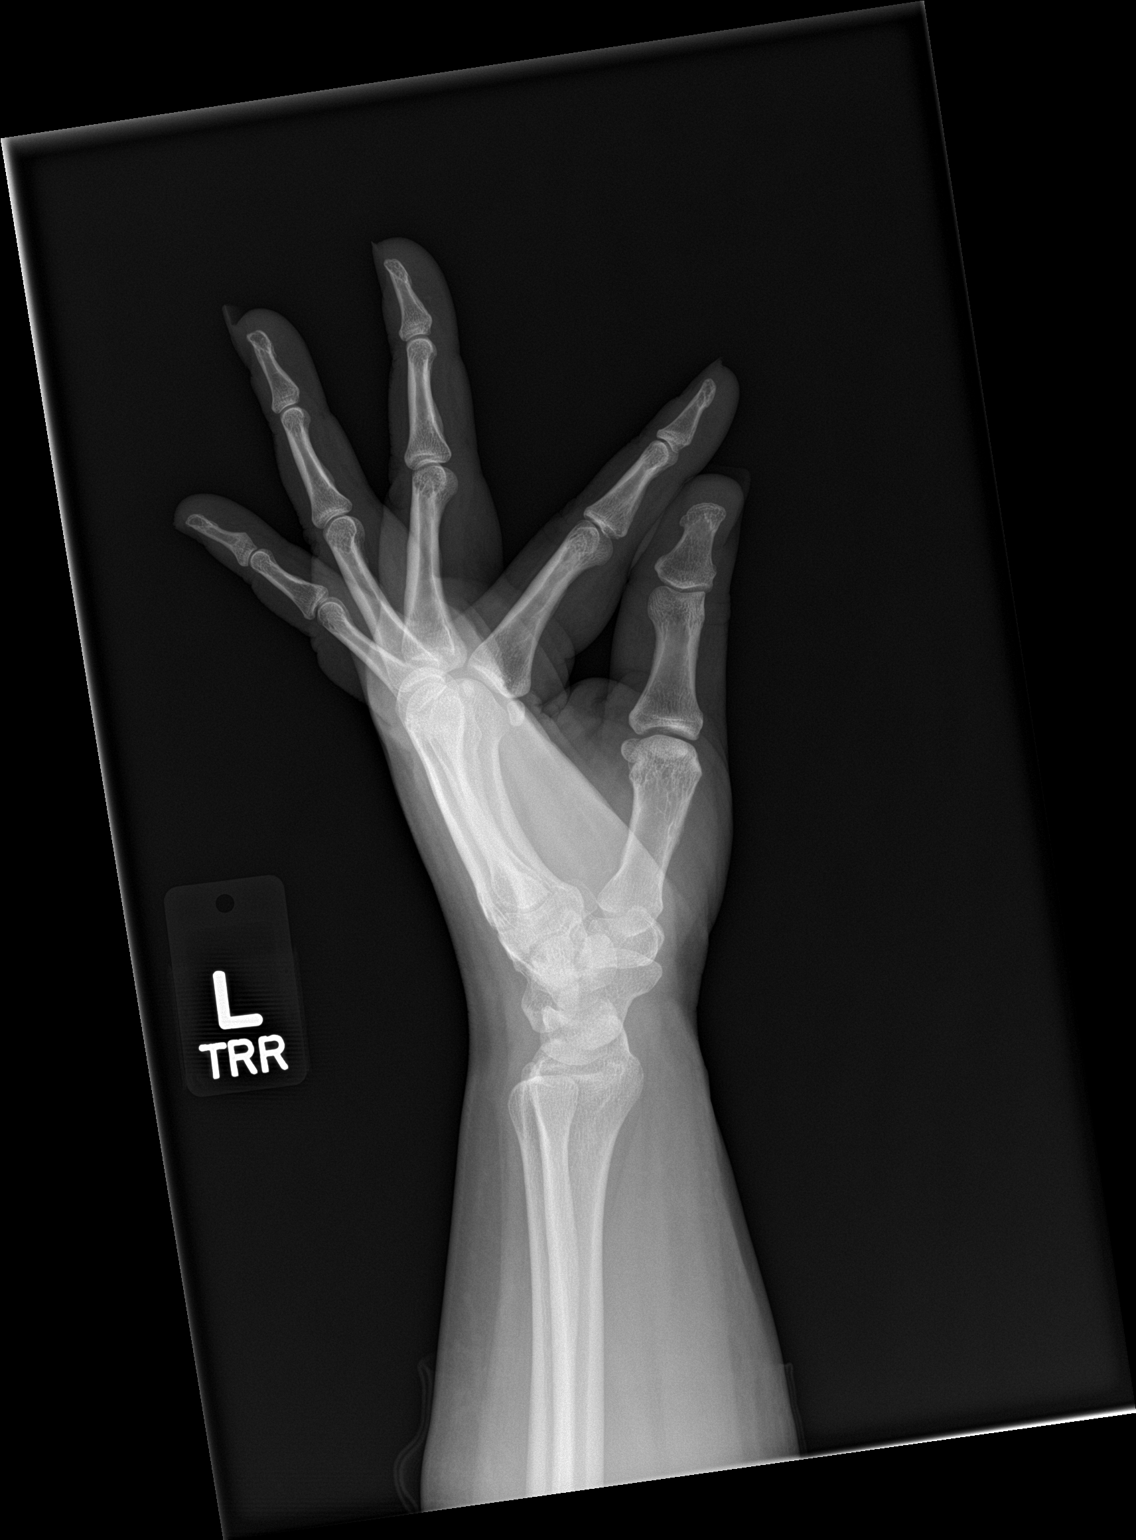

[3 of 3 positions shown; findings below may reference images not displayed]

FINDINGS: Three views of the left hand submitted. No acute fracture or
subluxation. No radiopaque foreign body.
IMPRESSION: Negative.

## 2017-08-18 ENCOUNTER — Ambulatory Visit: Payer: Self-pay | Admitting: Family Medicine

## 2017-09-04 ENCOUNTER — Telehealth: Payer: Self-pay | Admitting: *Deleted

## 2017-09-04 NOTE — Telephone Encounter (Signed)
Unable to reach [facility]phan[facility] @ [employer].

## 2017-09-10 NOTE — Telephone Encounter (Signed)
Caller name Relation to pt:  Self  Call back number: [phone]   Reason for call:  Patient checking on the status of forms, patient sates [employer] has not received forms and deadline is 09/19/16. Patient requesting forms fax to [phone] or  email: [email or web address] attention Katheran Jamesalisha borisow phone # [phone]

## 2017-09-10 NOTE — Telephone Encounter (Signed)
Pt office notes faxed to [facility] @ [phone] claim # [id number]

## 2017-09-23 ENCOUNTER — Ambulatory Visit: Payer: BLUE CROSS/BLUE SHIELD | Admitting: Neurology

## 2017-09-29 ENCOUNTER — Telehealth: Payer: Self-pay | Admitting: Family Medicine

## 2017-09-29 NOTE — Telephone Encounter (Signed)
Copied from CRM [phone]. Topic: Quick Communication - See Telephone Encounter >> Sep 29, 2017 11:21 AM Jolayne Hainesaylor, Brittany L wrote: CRM for notification. See Telephone encounter for:   09/29/17.   Stephanie from [employer] (short term disability for pt's job) stated the patient has been out for migraine's. Patient has not seen neurology since around September. She wants to know if Dr Laural BenesJohnson needs her to remain her out of work and what is the diagnosis for her headaches   Call back is [phone]

## 2017-09-29 NOTE — Telephone Encounter (Signed)
Called and left a message for patient to call the office and schedule a follow up appointment, to discuss migraines and paperwork.

## 2017-09-29 NOTE — Telephone Encounter (Signed)
Patient needs to come in for an appointment right?

## 2017-09-29 NOTE — Telephone Encounter (Signed)
Needs follow-up

## 2017-10-01 NOTE — Telephone Encounter (Signed)
Appointment scheduled.

## 2017-10-02 ENCOUNTER — Ambulatory Visit: Payer: Self-pay | Admitting: Family Medicine

## 2017-10-09 ENCOUNTER — Encounter: Payer: Self-pay | Admitting: Family Medicine

## 2017-10-14 ENCOUNTER — Encounter: Payer: Self-pay | Admitting: Family Medicine

## 2017-10-14 ENCOUNTER — Ambulatory Visit (INDEPENDENT_AMBULATORY_CARE_PROVIDER_SITE_OTHER): Payer: Self-pay | Admitting: Family Medicine

## 2017-10-14 VITALS — BP 109/76 | HR 82 | Temp 99.0°F | Wt 229.5 lb

## 2017-10-14 DIAGNOSIS — G43109 Migraine with aura, not intractable, without status migrainosus: Secondary | ICD-10-CM

## 2017-10-14 MED ORDER — RIZATRIPTAN BENZOATE 10 MG PO TABS: 10.0000 mg | ORAL_TABLET | ORAL | 3 refills | Status: DC | PRN

## 2017-10-14 MED ORDER — NORTRIPTYLINE HCL 25 MG PO CAPS: 25.0000 mg | ORAL_CAPSULE | Freq: Every day | ORAL | 3 refills | Status: DC

## 2017-10-14 MED ORDER — PROMETHAZINE HCL 25 MG PO TABS: 25.0000 mg | ORAL_TABLET | Freq: Three times a day (TID) | ORAL | 3 refills | Status: DC | PRN

## 2017-10-14 NOTE — Progress Notes (Signed)
BP 109/76 (BP Location: Right Arm, Patient Position: Sitting, Cuff Size: Normal)   Pulse 82   Temp 99 F (37.2 C) (Oral)   Wt 229 lb 8 oz (104.1 kg)   SpO2 99%   BMI 41.98 kg/m    Subjective:    Patient ID: Alexis Rangel, female    DOB: Mar 03, 1986, 32 y.o.   MRN: [id number]  HPI: Alexis Adjutantiffany L Cuttino is a 32 y.o. female  Chief Complaint  Patient presents with  . Migraine    x's 4 days  . Nausea  . Emesis   Pt here today with almost a week of intractable migraine sxs with N/V. Long hx of migraines and increased ICP, was being followed and treated by Neurology but lost insurance and has been without any of her medicines for a few months. Denies speech issues, weakness, numbness, tingling, confusion, syncope.   Relevant past medical, surgical, family and social history reviewed and updated as indicated. Interim medical history since our last visit reviewed. Allergies and medications reviewed and updated.  Review of Systems  Per HPI unless specifically indicated above     Objective:    BP 109/76 (BP Location: Right Arm, Patient Position: Sitting, Cuff Size: Normal)   Pulse 82   Temp 99 F (37.2 C) (Oral)   Wt 229 lb 8 oz (104.1 kg)   SpO2 99%   BMI 41.98 kg/m   Wt Readings from Last 3 Encounters:  10/14/17 229 lb 8 oz (104.1 kg)  07/16/17 240 lb (108.9 kg)  06/22/17 220 lb 7 oz (100 kg)    Physical Exam  Constitutional: She is oriented to person, place, and time. She appears well-developed and well-nourished. No distress.  HENT:  Head: Atraumatic.  Eyes: Conjunctivae are normal. Pupils are equal, round, and reactive to light. No scleral icterus.  Neck: Normal range of motion. Neck supple.  Cardiovascular: Normal rate and normal heart sounds.  Pulmonary/Chest: Effort normal and breath sounds normal.  Musculoskeletal: Normal range of motion.  Neurological: She is alert and oriented to person, place, and time. No cranial nerve deficit.  Skin: Skin is warm and  dry.  Psychiatric: She has a normal mood and affect. Her behavior is normal.  Nursing note and vitals reviewed.  Results for orders placed or performed during the hospital encounter of 06/10/17  Comprehensive metabolic panel  Result Value Ref Range   Sodium 137 135 - 145 mmol/L   Potassium 4.4 3.5 - 5.1 mmol/L   Chloride 104 101 - 111 mmol/L   CO2 24 22 - 32 mmol/L   Glucose, Bld 112 (H) 65 - 99 mg/dL   BUN 6 6 - 20 mg/dL   Creatinine, Ser 4.090.56 0.44 - 1.00 mg/dL   Calcium 9.1 8.9 - 81.110.3 mg/dL   Total Protein 8.2 (H) 6.5 - 8.1 g/dL   Albumin 4.1 3.5 - 5.0 g/dL   AST 22 15 - 41 U/L   ALT 25 14 - 54 U/L   Alkaline Phosphatase 96 38 - 126 U/L   Total Bilirubin 0.5 0.3 - 1.2 mg/dL   GFR calc non Af Amer >60 >60 mL/min   GFR calc Af Amer >60 >60 mL/min   Anion gap 9 5 - 15  Ethanol  Result Value Ref Range   Alcohol, Ethyl (B) <10 <10 mg/dL  Salicylate level  Result Value Ref Range   Salicylate Lvl <7.0 2.8 - 30.0 mg/dL  Acetaminophen level  Result Value Ref Range   Acetaminophen (Tylenol), Serum <  10 (L) 10 - 30 ug/mL  cbc  Result Value Ref Range   WBC 17.7 (H) 3.6 - 11.0 K/uL   RBC 5.02 3.80 - 5.20 MIL/uL   Hemoglobin 14.2 12.0 - 16.0 g/dL   HCT 16.1 09.6 - 04.5 %   MCV 83.9 80.0 - 100.0 fL   MCH 28.4 26.0 - 34.0 pg   MCHC 33.8 32.0 - 36.0 g/dL   RDW 40.9 (H) 81.1 - 91.4 %   Platelets 480 (H) 150 - 440 K/uL  Urine Drug Screen, Qualitative  Result Value Ref Range   Tricyclic, Ur Screen POSITIVE (A) NONE DETECTED   Amphetamines, Ur Screen NONE DETECTED NONE DETECTED   MDMA (Ecstasy)Ur Screen NONE DETECTED NONE DETECTED   Cocaine Metabolite,Ur [place] NONE DETECTED NONE DETECTED   Opiate, Ur Screen NONE DETECTED NONE DETECTED   Phencyclidine (PCP) Ur S NONE DETECTED NONE DETECTED   Cannabinoid 50 Ng, Ur  NONE DETECTED NONE DETECTED   Barbiturates, Ur Screen NONE DETECTED NONE DETECTED   Benzodiazepine, Ur Scrn NONE DETECTED NONE DETECTED   Methadone Scn, Ur NONE DETECTED  NONE DETECTED  Pregnancy, urine POC  Result Value Ref Range   Preg Test, Ur NEGATIVE NEGATIVE      Assessment & Plan:   Problem List Items Addressed This Visit      Cardiovascular and Mediastinum   Migraine - Primary    Will restart her nortriptyline, phenergan, and maxalt with goodrx coupons.  Discussed multiple options for low-cost care or insurance with pt so she can get the medical care she needs. F/u if not having any relief with current regimen      Relevant Medications   nortriptyline (PAMELOR) 25 MG capsule   rizatriptan (MAXALT) 10 MG tablet       Follow up plan: Return if symptoms worsen or fail to improve.

## 2017-10-15 ENCOUNTER — Encounter: Payer: BLUE CROSS/BLUE SHIELD | Admitting: Obstetrics and Gynecology

## 2017-10-17 NOTE — Patient Instructions (Signed)
Follow up as needed

## 2017-10-17 NOTE — Assessment & Plan Note (Signed)
Will restart her nortriptyline, phenergan, and maxalt with goodrx coupons.  Discussed multiple options for low-cost care or insurance with pt so she can get the medical care she needs. F/u if not having any relief with current regimen

## 2017-11-23 ENCOUNTER — Ambulatory Visit: Payer: BLUE CROSS/BLUE SHIELD | Admitting: Family Medicine

## 2017-11-26 ENCOUNTER — Ambulatory Visit: Payer: Self-pay | Admitting: Family Medicine

## 2017-12-25 IMAGING — CR DG CHEST 2V
1 series · 2 of 2 positions shown · non-contrast
Comparison: 04/01/2017

CLINICAL DATA: Chest pain

EXAM:
CHEST  2 VIEW

[Series 1: dg chest 2 view · 0.14mm/px · 2 of 2 slices shown]
[im 1/2]
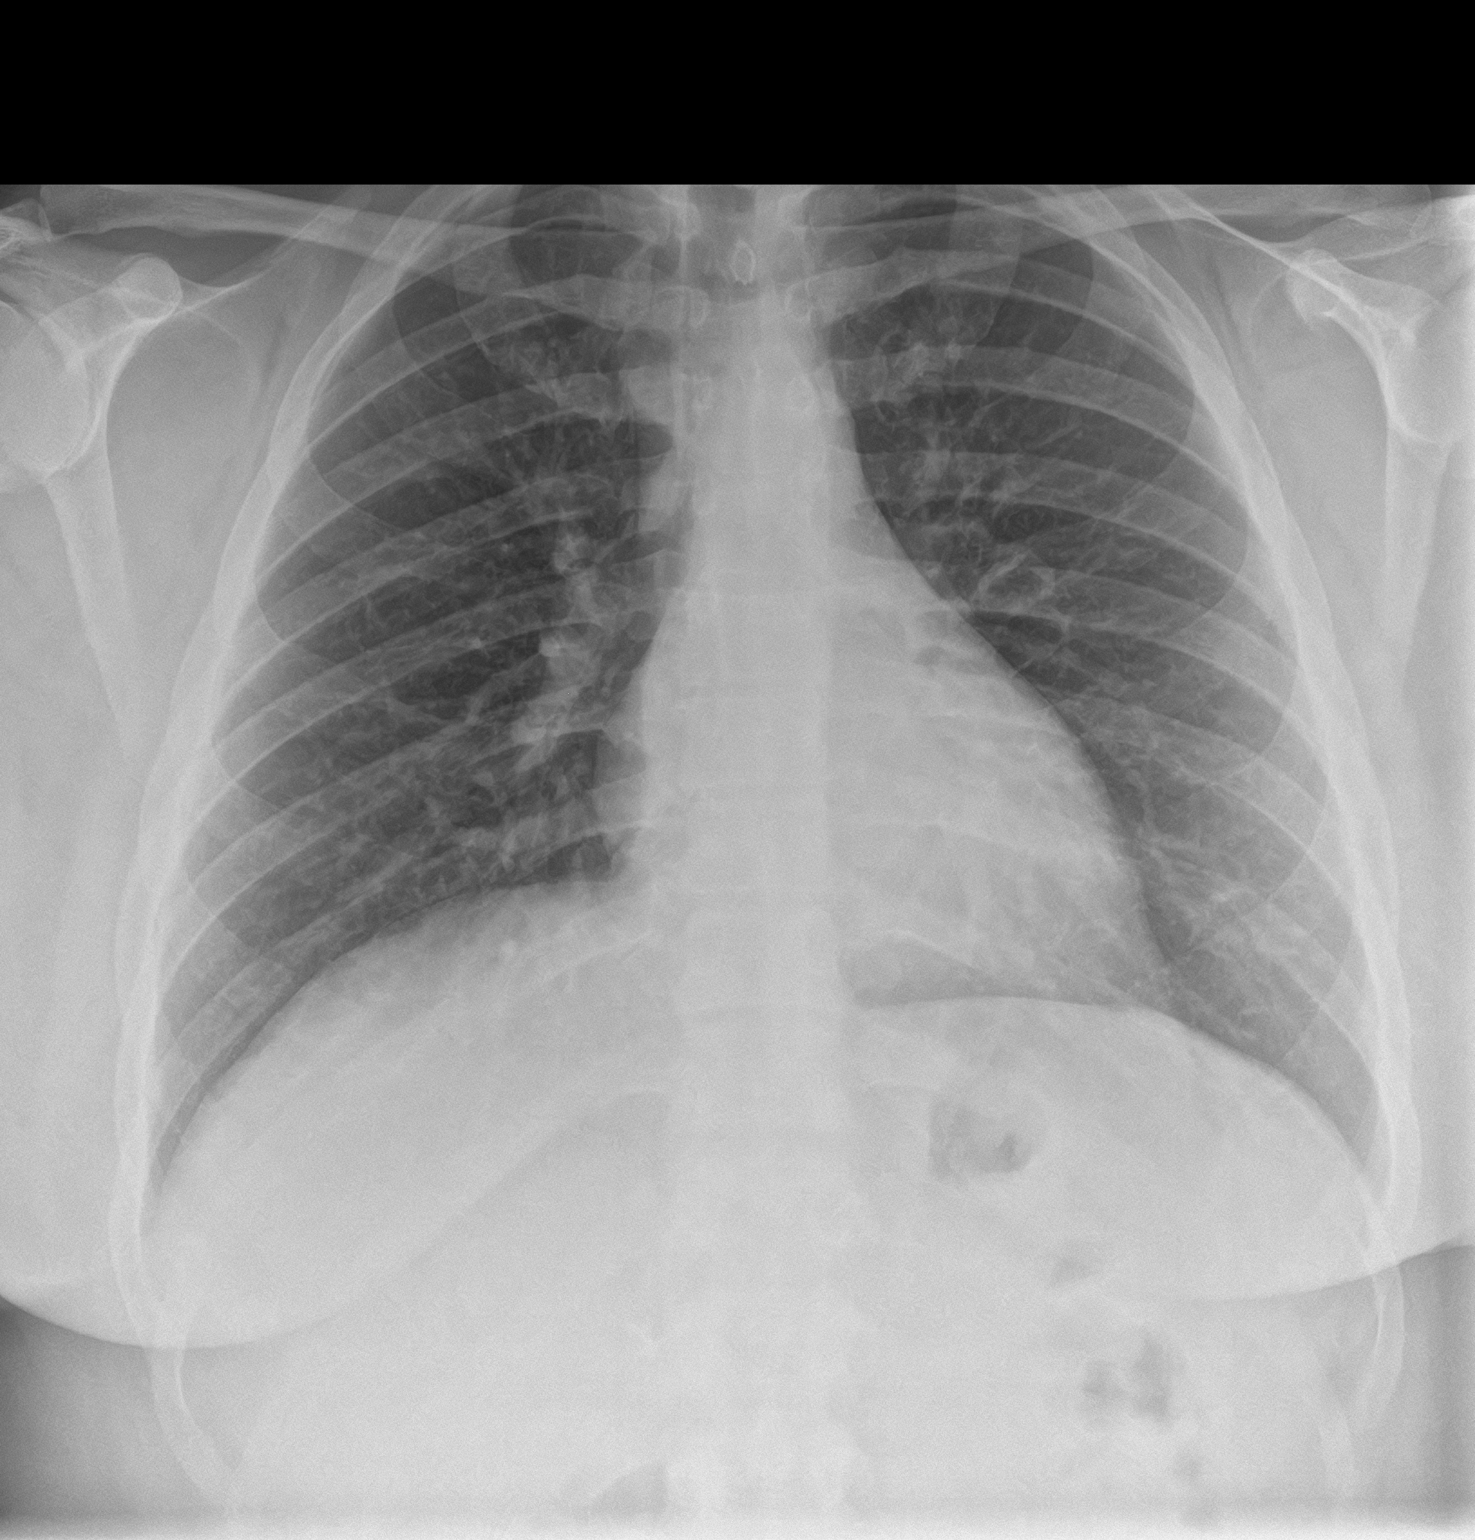
[im 2/2]
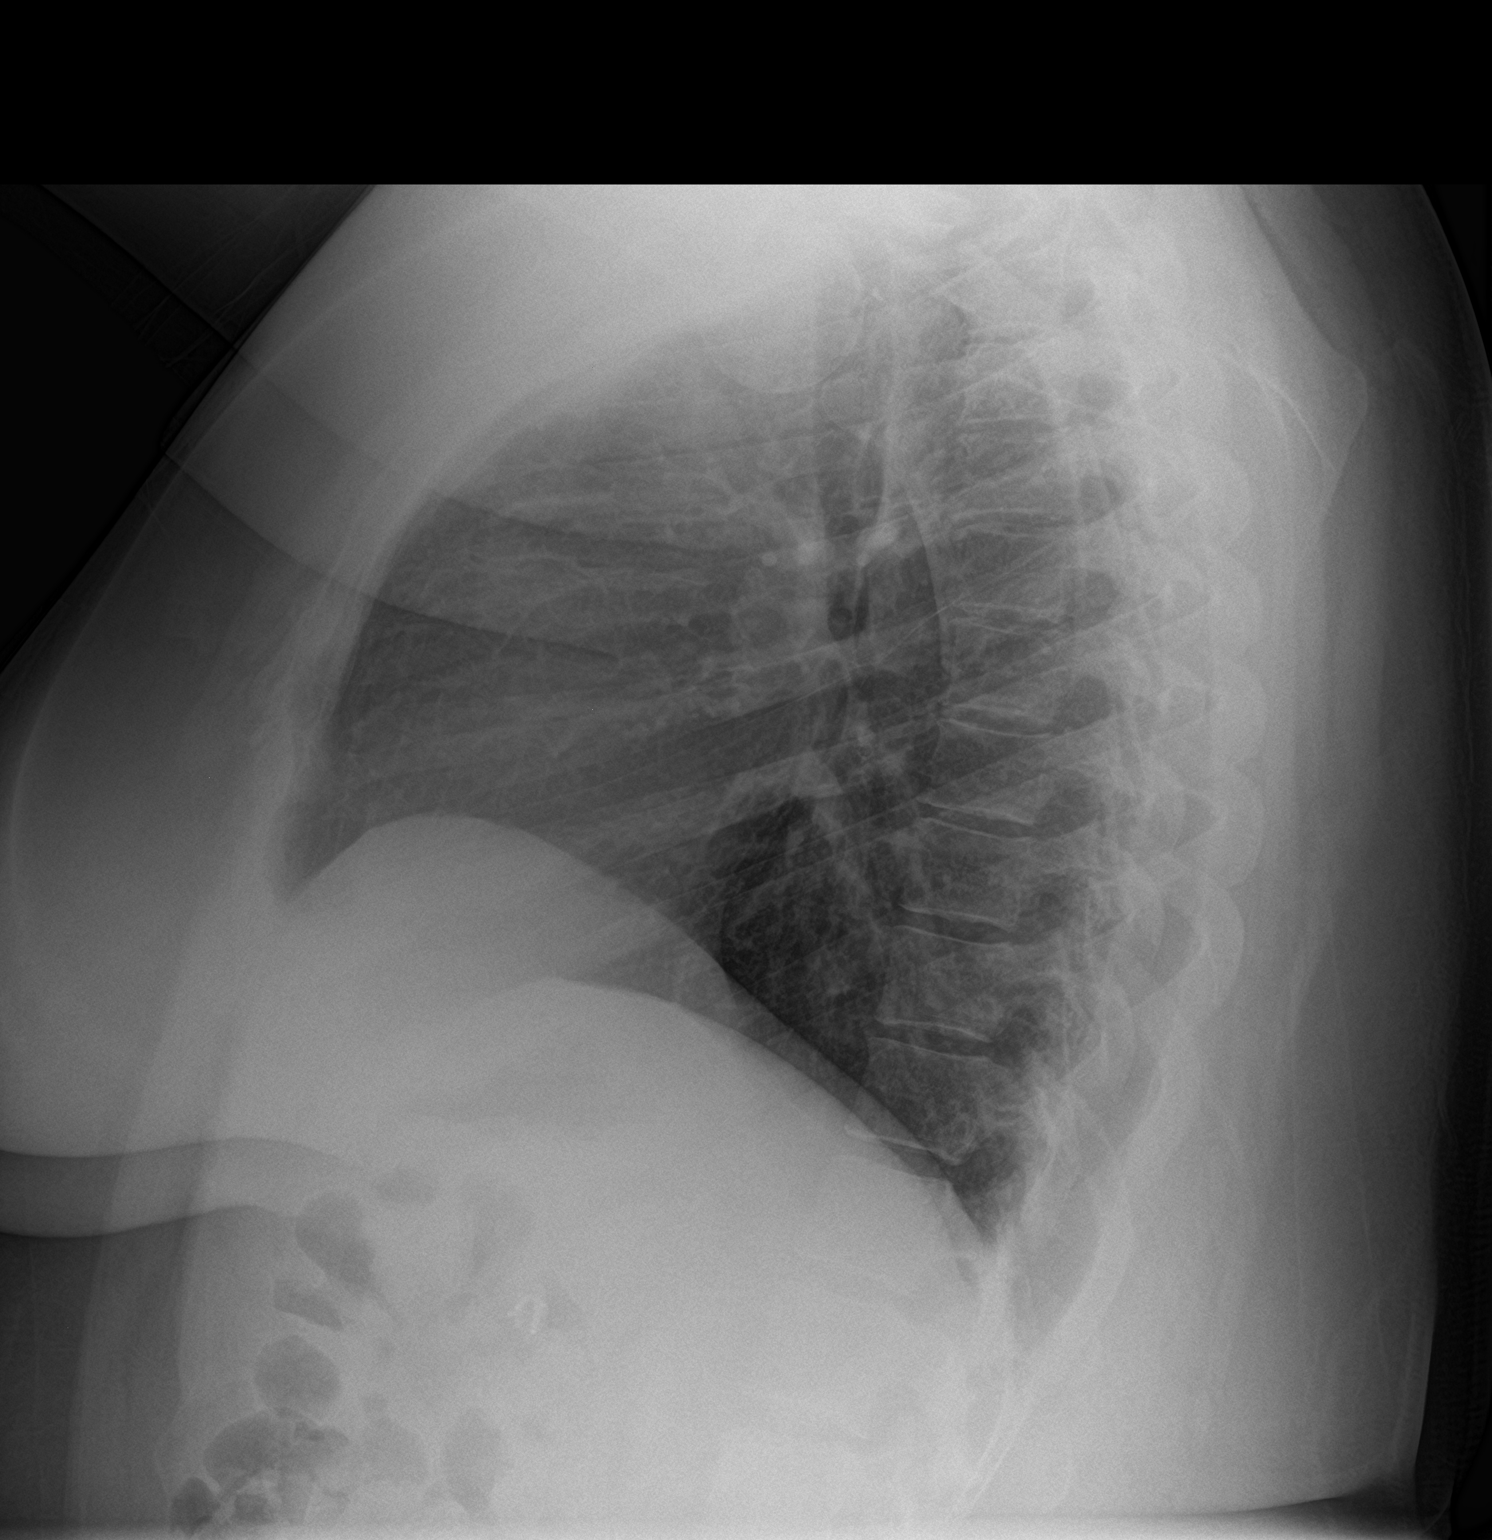

[2 of 2 positions shown; findings below may reference images not displayed]

FINDINGS: Lateral view degraded by patient arm position. Midline trachea.
Normal heart size and mediastinal contours.Sharp costophrenic
angles. No pneumothorax. Clear lungs. Mild right hemidiaphragm
elevation.
IMPRESSION: No active cardiopulmonary disease.

## 2017-12-31 ENCOUNTER — Encounter: Payer: Self-pay | Admitting: Obstetrics and Gynecology

## 2018-01-20 ENCOUNTER — Encounter: Payer: Self-pay | Admitting: Obstetrics and Gynecology

## 2018-01-29 IMAGING — XA DG FLUORO GUIDE LUMBAR PUNCTURE
1 series · 1 of 1 positions shown · non-contrast
Comparison: none

CLINICAL DATA: Chronic intractable headaches.

[Series 1: ortho standard · 1 of 1 slices shown]
[im 1/1]
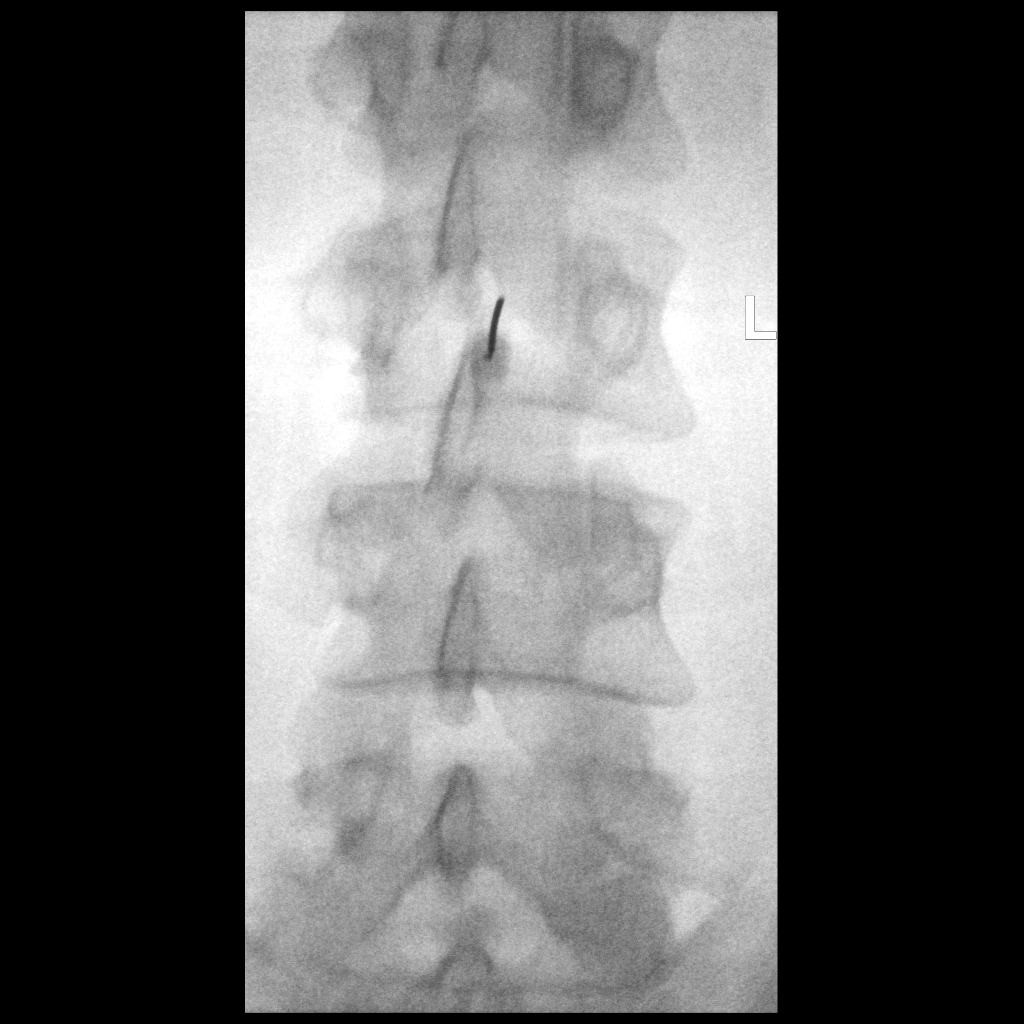

[1 of 1 positions shown; findings below may reference images not displayed]

EXAM:
DIAGNOSTIC LUMBAR PUNCTURE UNDER FLUOROSCOPIC GUIDANCE

FLUOROSCOPY TIME:  Radiation Exposure Index (as provided by the
fluoroscopic device): 11.95 uGy*m2

Fluoroscopy Time:  8 seconds

Number of Acquired Images:  0

PROCEDURE:
Informed consent was obtained from the patient prior to the
procedure, including potential complications of headache, allergy,
and pain. With the patient prone, the lower back was prepped with
Betadine. 1% Lidocaine was used for local anesthesia. Lumbar
puncture was performed at the L2-3 level using a 20 gauge needle
with return of clear CSF with an opening pressure of 25.5 cm water.
17.0 ml of CSF were obtained for laboratory studies. The closing
pressure was then 12.0 cm water. The patient tolerated the procedure
well and there were no apparent complications.
IMPRESSION: 1. Elevated opening pressure of 25.5 cm water, consistent with
idiopathic intracranial hypertension.
2. CSF pressure was normalized to 12 cm water after removal of
mL of CSF.

## 2018-03-08 ENCOUNTER — Emergency Department
Admission: EM | Admit: 2018-03-08 | Discharge: 2018-03-08 | Disposition: A | Discharge status: Home / Self Care | Payer: PRIVATE HEALTH INSURANCE | Attending: Emergency Medicine | Admitting: Emergency Medicine

## 2018-03-08 ENCOUNTER — Encounter: Payer: Self-pay | Admitting: Emergency Medicine

## 2018-03-08 ENCOUNTER — Emergency Department: Payer: PRIVATE HEALTH INSURANCE

## 2018-03-08 ENCOUNTER — Other Ambulatory Visit: Payer: Self-pay

## 2018-03-08 DIAGNOSIS — Z79899 Other long term (current) drug therapy: Secondary | ICD-10-CM | POA: Insufficient documentation

## 2018-03-08 DIAGNOSIS — R079 Chest pain, unspecified: Secondary | ICD-10-CM | POA: Insufficient documentation

## 2018-03-08 LAB — CBC
HCT: 39.3 % (ref 35.0–47.0)
Hemoglobin: 13.2 g/dL (ref 12.0–16.0)
MCH: 28.2 pg (ref 26.0–34.0)
MCHC: 33.7 g/dL (ref 32.0–36.0)
MCV: 83.9 fL (ref 80.0–100.0)
Platelets: 466 10*3/uL — ABNORMAL HIGH (ref 150–440)
RBC: 4.69 MIL/uL (ref 3.80–5.20)
RDW: 14.8 % — AB (ref 11.5–14.5)
WBC: 10.9 10*3/uL (ref 3.6–11.0)

## 2018-03-08 LAB — BASIC METABOLIC PANEL
Anion gap: 6 (ref 5–15)
BUN: 8 mg/dL (ref 6–20)
CHLORIDE: 105 mmol/L (ref 98–111)
CO2: 26 mmol/L (ref 22–32)
CREATININE: 0.7 mg/dL (ref 0.44–1.00)
Calcium: 8.5 mg/dL — ABNORMAL LOW (ref 8.9–10.3)
GLUCOSE: 111 mg/dL — AB (ref 70–99)
Potassium: 3.8 mmol/L (ref 3.5–5.1)
SODIUM: 137 mmol/L (ref 135–145)

## 2018-03-08 LAB — POCT PREGNANCY, URINE: Preg Test, Ur: NEGATIVE

## 2018-03-08 LAB — FIBRIN DERIVATIVES D-DIMER (ARMC ONLY): FIBRIN DERIVATIVES D-DIMER (ARMC): 251.03 ng{FEU}/mL (ref 0.00–499.00)

## 2018-03-08 LAB — TROPONIN I: Troponin I: <0.03 ng/mL (ref <0.03)

## 2018-03-08 NOTE — Discharge Instructions (Addendum)
Test that we have done here today are negative.  Please follow-up with your primary care doctor.  You do have some chest wall tenderness.  This may benefit from taking Motrin.  You can take 3 of the over-the-counter pills 3 times a day with food for 3 to 4 days.  Do not take it for longer than that for now.  Please return for worse or different pain.

## 2018-03-08 NOTE — ED Notes (Signed)
ED Provider at bedside. 

## 2018-03-08 NOTE — ED Notes (Signed)
AAOx3.  Skin warm and dry.  No SOB/ DOE.   

## 2018-03-08 NOTE — ED Provider Notes (Signed)
[facility] Emergency Department Provider Note   ____________________________________________   First MD Initiated Contact with Patient 03/08/18 [phone]     (approximate)  I have reviewed the triage vital signs and the nursing notes.   HISTORY  Chief Complaint Chest Pain    HPI Alexis Rangel is a 32 y.o. female who reports left-sided chest pain radiating into the left axilla.  Comes and goes last possibly half an hour and then resolves.  She reports some shortness of breath with exertion but only mild.  She is been a little bit lightheaded as well.  She said is been intermittent for several weeks but got worse yesterday.  And feels sharp and is moderately severe when it occurs   Past Medical History:  Diagnosis Date  . Bipolar 1 disorder (HCC)   . Depression   . Headache   . PCO (polycystic ovaries)     Patient Active Problem List   Diagnosis Date Noted  . Adjustment disorder with mixed disturbance of emotions and conduct 06/11/2017  . Bipolar 2 disorder (HCC) 06/11/2017  . Tingling 05/28/2017  . Idiopathic intracranial hypertension 05/25/2017  . Papilledema 05/01/2017  . Headache 05/01/2017  . Neck pain 05/01/2017  . Pelvic pain in female 05/20/2016  . Hypocalcemia 03/11/2016  . Hyperinsulinemia 03/11/2016  . PCO (polycystic ovaries)   . Family planning 12/18/2015  . Depression 12/18/2015  . Obesity 10/17/2015  . Migraine 10/17/2015  . Cholelithiasis 10/16/2015    Past Surgical History:  Procedure Laterality Date  . ccy  06/09/2014   pt states she does not remember this surgery  . CHOLECYSTECTOMY    . TONSILLECTOMY AND ADENOIDECTOMY    . WISDOM TOOTH EXTRACTION      Prior to Admission medications   Medication Sig Start Date End Date Taking? Authorizing Provider  acetaminophen (TYLENOL) 500 MG tablet Take 500 mg by mouth every 6 (six) hours as needed.    [provider]  cyclobenzaprine (FLEXERIL) 10 MG tablet Take 10  mg by mouth daily as needed.  01/15/17   [provider]  DULoxetine (CYMBALTA) 60 MG capsule Take 1 capsule (60 mg total) daily by mouth. Patient not taking: Reported on 10/14/2017 07/16/17   Olevia Perches P, DO  HYDROcodone-acetaminophen (NORCO/VICODIN) 5-325 MG tablet Take 1 tablet by mouth every 6 (six) hours as needed for moderate pain. Patient not taking: Reported on 10/14/2017 05/25/17   Olevia Perches P, DO  Levonorgestrel [facility]) 19.5 MG IUD 1 Device by Intrauterine route once. Patient not taking: Reported on 10/14/2017 01/18/16   Purcell Nails, CNM  methylphenidate (CONCERTA) 18 MG PO CR tablet Take 1 tablet (18 mg total) by mouth daily. Patient not taking: Reported on 10/14/2017 05/28/17   Asa Lente, MD  nortriptyline (PAMELOR) 25 MG capsule Take 1 capsule (25 mg total) by mouth at bedtime. 10/14/17   Particia Nearing, PA-C  promethazine (PHENERGAN) 25 MG tablet Take 1 tablet (25 mg total) by mouth every 8 (eight) hours as needed for nausea or vomiting. 10/14/17   Particia Nearing, PA-C  QUEtiapine (SEROQUEL) 50 MG tablet Take 1 tablet (50 mg total) at bedtime by mouth. Patient not taking: Reported on 10/14/2017 07/16/17   Olevia Perches P, DO  rizatriptan (MAXALT) 10 MG tablet Take 1 tablet (10 mg total) by mouth as needed for migraine. May repeat in 2 hours if needed 10/14/17   Particia Nearing, PA-C    Allergies Patient has no known allergies.  Family History  Problem Relation Age of Onset  . Cancer Mother        cervical  . COPD Mother   . Hyperlipidemia Father   . Hypertension Father   . Migraines Father   . Bipolar disorder Sister   . Heart disease Maternal Grandmother   . COPD Maternal Grandmother   . Heart disease Maternal Grandfather   . Hypertension Paternal Grandmother   . Heart disease Paternal Grandmother   . Hypertension Paternal Grandfather   . Heart disease Paternal Grandfather   . Alzheimer's disease Paternal Grandfather     . Diabetes Neg Hx     Social History Social History   Tobacco Use  . Smoking status: Never Smoker  . Smokeless tobacco: Never Used  Substance Use Topics  . Alcohol use: No  . Drug use: No    Review of Systems  Constitutional: No fever/chills Eyes: No visual changes. ENT: No sore throat. Cardiovascular: See HPI Respiratory:  shortness of breath. Gastrointestinal: No abdominal pain.  No nausea, no vomiting.  No diarrhea.  No constipation. Genitourinary: Negative for dysuria. Musculoskeletal: Negative for back pain. Skin: Negative for rash. Neurological: Negative for headaches, focal weakness   ____________________________________________   PHYSICAL EXAM:  VITAL SIGNS: ED Triage Vitals  Enc Vitals Group     BP 03/08/18 0942 133/74     Pulse Rate 03/08/18 0942 89     Resp 03/08/18 0942 16     Temp 03/08/18 0942 98.4 F (36.9 C)     Temp Source 03/08/18 0942 Oral     SpO2 03/08/18 0942 100 %     Weight 03/08/18 0943 229 lb (103.9 kg)     Height --      Head Circumference --      Peak Flow --      Pain Score 03/08/18 0942 8     Pain Loc --      Pain Edu? --      Excl. in GC? --     Constitutional: Alert and oriented. Well appearing and in no acute distress. Eyes: Conjunctivae are normal. Head: Atraumatic. Nose: No congestion/rhinnorhea. Mouth/Throat: Mucous membranes are moist.  Oropharynx non-erythematous. Neck: No stridor.   Cardiovascular: Normal rate, regular rhythm. Grossly normal heart sounds.  Good peripheral circulation.  There is some chest pain on palpation of the left chest just above the left breast.  There is no masses or swollen glands or skin discoloration in the left axilla. Respiratory: Normal respiratory effort.  No retractions. Lungs CTAB. Gastrointestinal: Soft and nontender. No distention. No abdominal bruits. No CVA tenderness. Musculoskeletal: No lower extremity tenderness trace edema  Neurologic:  Normal speech and language. No gross  focal neurologic deficits are appreciated.  Skin:  Skin is warm, dry and intact. No rash noted. Psychiatric: Mood and affect are normal. Speech and behavior are normal.  ____________________________________________   LABS (all labs ordered are listed, but only abnormal results are displayed)  Labs Reviewed  BASIC METABOLIC PANEL - Abnormal; Notable for the following components:      Result Value   Glucose, Bld 111 (*)    Calcium 8.5 (*)    All other components within normal limits  CBC - Abnormal; Notable for the following components:   RDW 14.8 (*)    Platelets 466 (*)    All other components within normal limits  TROPONIN I  FIBRIN DERIVATIVES D-DIMER ([facility] ONLY)  POC URINE PREG, ED  POCT PREGNANCY, URINE   ____________________________________________  EKG  EKG read and interpreted by me shows normal sinus rhythm rate of 90 normal axis no acute ST-T wave changes ____________________________________________  RADIOLOGY  ED MD interpretation: Chest x-ray read by radiology reviewed by me shows no acute disease  Official radiology report(s): Dg Chest 2 View  Result Date: 03/08/2018 CLINICAL DATA:  Intermittent left-sided chest pain for the past week with some extension of symptoms into the shoulder. Associated shortness of breath and dizziness. EXAM: CHEST - 2 VIEW COMPARISON:  PA and lateral chest of April 10, 2017 FINDINGS: The lungs are adequately inflated and clear. The heart and pulmonary vascularity are normal. There is no pleural effusion or pneumothorax. The mediastinum is normal in width. The bony thorax is unremarkable. IMPRESSION: There is no pneumonia, CHF, nor other acute cardiopulmonary abnormality. Electronically Signed   By: David  [place] M.D.   On: 03/08/2018 10:14    ____________________________________________   PROCEDURES  Procedure(s) performed:   Procedures  Critical Care performed:   ____________________________________________   INITIAL  IMPRESSION / ASSESSMENT AND PLAN / ED COURSE  Patient's EKG shows no acute changes troponin is negative, d-dimer is negative, pain is atypical patient had seen cardiology in October last year they felt it was unlikely to be cardiology related chest pain at that time.  I will have patient follow-up with her primary care doctor and possibly with [facility] cardiology which had seen her before.         ____________________________________________   FINAL CLINICAL IMPRESSION(S) / ED DIAGNOSES  Final diagnoses:  Chest pain, unspecified type     ED Discharge Orders    None       Note:  This document was prepared using Dragon voice recognition software and may include unintentional dictation errors.    Arnaldo Natal, MD 03/08/18 1130

## 2018-03-08 NOTE — ED Notes (Signed)
Patient transported to X-ray 

## 2018-03-08 NOTE — ED Triage Notes (Signed)
Pt to ed with c/o chest pain intermittent for several weeks, worse since yesterday, left side stabbing pain in breast and radiates to left axillary area. Pt reports sob and lightheadedness.  Pt states occasionally it gets worse with movement.

## 2018-06-24 ENCOUNTER — Encounter: Payer: Self-pay | Admitting: Obstetrics and Gynecology

## 2018-07-07 ENCOUNTER — Ambulatory Visit: Payer: Self-pay | Admitting: Family Medicine

## 2018-07-07 ENCOUNTER — Encounter: Payer: Self-pay | Admitting: Family Medicine

## 2018-07-07 VITALS — BP 121/85 | HR 94 | Temp 97.6°F | Ht 64.0 in | Wt 234.0 lb

## 2018-07-07 DIAGNOSIS — G43109 Migraine with aura, not intractable, without status migrainosus: Secondary | ICD-10-CM

## 2018-07-07 DIAGNOSIS — F3181 Bipolar II disorder: Secondary | ICD-10-CM

## 2018-07-07 MED ORDER — SUMATRIPTAN SUCCINATE 100 MG PO TABS: ORAL_TABLET | ORAL | 0 refills | Status: DC

## 2018-07-07 MED ORDER — NORTRIPTYLINE HCL 25 MG PO CAPS: 25.0000 mg | ORAL_CAPSULE | Freq: Every day | ORAL | 1 refills | Status: DC

## 2018-07-07 MED ORDER — QUETIAPINE FUMARATE 100 MG PO TABS: 50.0000 mg | ORAL_TABLET | Freq: Every day | ORAL | 1 refills | Status: DC

## 2018-07-07 NOTE — Progress Notes (Signed)
BP 121/85   Pulse 94   Temp 97.6 F (36.4 C) (Oral)   Ht 5\' 4"  (1.626 m)   Wt 234 lb (106.1 kg)   SpO2 98%   BMI 40.17 kg/m    Subjective:    Patient ID: Alexis Rangel, female    DOB: 04-26-1986, 32 y.o.   MRN: [id number]  HPI: Alexis Rangel is a 32 y.o. female  Chief Complaint  Patient presents with  . Migraine    Having frequent headache days.    Here today with worsening migraines, with most recent one being severe x 2 days. Has been off all prescription medications for months after losing her health insurance. Previously on nortriptyline and imitrex prn with fairly good relief.  Taking excedrin extreme and tylenol with no relief. Denies significant nausea, visual disturbances, confusion, extremity weakness or paresthesias.   Also having worsening moods since being off seroquel. Notes she is agitated all the time and cries for no reason. Moods are shifting quickly as well. Significant other has noticed a major difference since she's been off the medicine. Denies SI/HI.    Depression screen [facility] 2/9 07/16/2017 06/22/2017 04/21/2017  Decreased Interest 2 3 2   Down, Depressed, Hopeless 3 3 3   PHQ - 2 Score 5 6 5   Altered sleeping 1 3 1   Tired, decreased energy 2 2 2   Change in appetite 3 3 3   Feeling bad or failure about yourself  3 3 2   Trouble concentrating 1 2 2   Moving slowly or fidgety/restless 2 2 1   Suicidal thoughts 2 3 1   PHQ-9 Score 19 24 17   Difficult doing work/chores Somewhat difficult Extremely dIfficult -   Past Medical History:  Diagnosis Date  . Bipolar 1 disorder (HCC)   . Depression   . Headache   . PCO (polycystic ovaries)    Social History   Socioeconomic History  . Marital status: Single    Spouse name: Not on file  . Number of children: Not on file  . Years of education: Not on file  . Highest education level: Not on file  Occupational History  . Not on file  Social Needs  . Financial resource strain: Not on file  . Food  insecurity:    Worry: Not on file    Inability: Not on file  . Transportation needs:    Medical: Not on file    Non-medical: Not on file  Tobacco Use  . Smoking status: Never Smoker  . Smokeless tobacco: Never Used  Substance and Sexual Activity  . Alcohol use: No  . Drug use: No  . Sexual activity: Yes    Birth control/protection: IUD    Comment: kyleena  Lifestyle  . Physical activity:    Days per week: Not on file    Minutes per session: Not on file  . Stress: Not on file  Relationships  . Social connections:    Talks on phone: Not on file    Gets together: Not on file    Attends religious service: Not on file    Active member of club or organization: Not on file    Attends meetings of clubs or organizations: Not on file    Relationship status: Not on file  . Intimate partner violence:    Fear of current or ex partner: Not on file    Emotionally abused: Not on file    Physically abused: Not on file    Forced sexual activity: Not on file  Other Topics Concern  . Not on file  Social History Narrative  . Not on file    Relevant past medical, surgical, family and social history reviewed and updated as indicated. Interim medical history since our last visit reviewed. Allergies and medications reviewed and updated.  Review of Systems  Per HPI unless specifically indicated above     Objective:    BP 121/85   Pulse 94   Temp 97.6 F (36.4 C) (Oral)   Ht 5\' 4"  (1.626 m)   Wt 234 lb (106.1 kg)   SpO2 98%   BMI 40.17 kg/m   Wt Readings from Last 3 Encounters:  07/07/18 234 lb (106.1 kg)  03/08/18 229 lb (103.9 kg)  10/14/17 229 lb 8 oz (104.1 kg)    Physical Exam  Constitutional: She is oriented to person, place, and time. She appears well-developed and well-nourished. No distress.  HENT:  Head: Atraumatic.  Eyes: Conjunctivae and EOM are normal.  Neck: Normal range of motion. Neck supple.  Cardiovascular: Normal rate and regular rhythm.  Murmur  heard. Pulmonary/Chest: Effort normal and breath sounds normal.  Musculoskeletal: Normal range of motion.  Neurological: She is alert and oriented to person, place, and time. No cranial nerve deficit.  Skin: Skin is warm and dry.  Psychiatric: Her behavior is normal. Thought content normal.  Tearful during interview  Nursing note and vitals reviewed.   Results for orders placed or performed during the hospital encounter of 03/08/18  Basic metabolic panel  Result Value Ref Range   Sodium 137 135 - 145 mmol/L   Potassium 3.8 3.5 - 5.1 mmol/L   Chloride 105 98 - 111 mmol/L   CO2 26 22 - 32 mmol/L   Glucose, Bld 111 (H) 70 - 99 mg/dL   BUN 8 6 - 20 mg/dL   Creatinine, Ser 8.11 0.44 - 1.00 mg/dL   Calcium 8.5 (L) 8.9 - 10.3 mg/dL   GFR calc non Af Amer >60 >60 mL/min   GFR calc Af Amer >60 >60 mL/min   Anion gap 6 5 - 15  CBC  Result Value Ref Range   WBC 10.9 3.6 - 11.0 K/uL   RBC 4.69 3.80 - 5.20 MIL/uL   Hemoglobin 13.2 12.0 - 16.0 g/dL   HCT 91.4 78.2 - 95.6 %   MCV 83.9 80.0 - 100.0 fL   MCH 28.2 26.0 - 34.0 pg   MCHC 33.7 32.0 - 36.0 g/dL   RDW 21.3 (H) 08.6 - 57.8 %   Platelets 466 (H) 150 - 440 K/uL  Troponin I  Result Value Ref Range   Troponin I <0.03 <0.03 ng/mL  Fibrin derivatives D-Dimer ([facility] only)  Result Value Ref Range   Fibrin derivatives D-dimer ([facility]) 251.03 0.00 - 499.00 ng/mL (FEU)  Pregnancy, urine POC  Result Value Ref Range   Preg Test, Ur NEGATIVE NEGATIVE      Assessment & Plan:   Problem List Items Addressed This Visit      Cardiovascular and Mediastinum   Migraine - Primary    Restart nortriptyline and prn imitrex - goodrx coupons given for both. Continue OTC pain relievers prn and work on identifying and avoiding triggers      Relevant Medications   nortriptyline (PAMELOR) 25 MG capsule   SUMAtriptan (IMITREX) 100 MG tablet     Other   Bipolar 2 disorder (HCC)    Restart seroquel, will look into counseling once she regains health  insurance which should be in the next  few months.           Follow up plan: Return in about 3 months (around 10/07/2018) for CPE, mood f/u.

## 2018-07-11 NOTE — Patient Instructions (Signed)
Follow up in 3 months

## 2018-07-11 NOTE — Assessment & Plan Note (Signed)
Restart nortriptyline and prn imitrex - goodrx coupons given for both. Continue OTC pain relievers prn and work on identifying and avoiding triggers

## 2018-07-11 NOTE — Assessment & Plan Note (Signed)
Restart seroquel, will look into counseling once she regains health insurance which should be in the next few months.

## 2018-07-28 ENCOUNTER — Encounter: Payer: Self-pay | Admitting: Obstetrics and Gynecology

## 2018-07-28 ENCOUNTER — Encounter: Payer: Self-pay | Admitting: *Deleted

## 2018-08-04 ENCOUNTER — Encounter: Payer: Self-pay | Admitting: *Deleted

## 2018-08-05 ENCOUNTER — Encounter: Payer: Self-pay | Admitting: Obstetrics and Gynecology

## 2018-08-09 ENCOUNTER — Encounter: Payer: Self-pay | Admitting: Obstetrics and Gynecology

## 2018-08-12 ENCOUNTER — Encounter: Payer: Self-pay | Admitting: Obstetrics and Gynecology

## 2018-09-10 ENCOUNTER — Encounter: Payer: Self-pay | Admitting: Obstetrics and Gynecology

## 2018-09-15 ENCOUNTER — Encounter: Payer: Self-pay | Admitting: Obstetrics and Gynecology

## 2018-09-15 ENCOUNTER — Other Ambulatory Visit (HOSPITAL_COMMUNITY)
Admission: RE | Admit: 2018-09-15 | Discharge: 2018-09-15 | Disposition: A | Discharge status: Home / Self Care | Payer: Self-pay | Source: Ambulatory Visit | Attending: Obstetrics and Gynecology | Admitting: Obstetrics and Gynecology

## 2018-09-15 ENCOUNTER — Ambulatory Visit: Payer: Self-pay | Admitting: Obstetrics and Gynecology

## 2018-09-15 VITALS — BP 117/83 | HR 88 | Ht 62.0 in | Wt 235.6 lb

## 2018-09-15 DIAGNOSIS — Z01419 Encounter for gynecological examination (general) (routine) without abnormal findings: Secondary | ICD-10-CM | POA: Insufficient documentation

## 2018-09-15 DIAGNOSIS — Z30431 Encounter for routine checking of intrauterine contraceptive device: Secondary | ICD-10-CM

## 2018-09-15 DIAGNOSIS — Z6841 Body Mass Index (BMI) 40.0 and over, adult: Secondary | ICD-10-CM

## 2018-09-15 MED ORDER — PHENTERMINE HCL 37.5 MG PO TABS: 37.5000 mg | ORAL_TABLET | Freq: Every day | ORAL | 2 refills | Status: DC

## 2018-09-15 MED ORDER — CYANOCOBALAMIN 1000 MCG/ML IJ SOLN: 1000.0000 ug | INTRAMUSCULAR | 1 refills | Status: DC

## 2018-09-15 NOTE — Progress Notes (Signed)
Subjective:   Alexis Rangel is a 33 y.o. G0P0000 Caucasian female here for a routine well-woman exam.  No LMP recorded. (Menstrual status: IUD).    Current complaints: none except weight gain, desires restarting weight loss program. PCP: Laural Benes       does desire labs  Social History: Sexual: heterosexual Marital Status: single Living situation: alone Occupation: unknown occupation Tobacco/alcohol: no tobacco use Illicit drugs: no history of illicit drug use  The following portions of the patient's history were reviewed and updated as appropriate: allergies, current medications, past family history, past medical history, past social history, past surgical history and problem list. FT student in animal care. Working FT at [employer]. Sexually active with female boyfriend. Desires restart weight loss medications.  Past Medical History Past Medical History:  Diagnosis Date  . Bipolar 1 disorder (HCC)   . Depression   . Headache   . PCO (polycystic ovaries)     Past Surgical History Past Surgical History:  Procedure Laterality Date  . ccy  06/09/2014   pt states she does not remember this surgery  . CHOLECYSTECTOMY    . TONSILLECTOMY AND ADENOIDECTOMY    . WISDOM TOOTH EXTRACTION      Gynecologic History G0P0000  No LMP recorded. (Menstrual status: IUD). Contraception: IUD Last Pap: 2017. Results were: normal   Obstetric History OB History  Gravida Para Term Preterm AB Living  0 0 0 0 0 0  SAB TAB Ectopic Multiple Live Births  0 0 0 0      Current Medications Current Outpatient Medications on File Prior to Visit  Medication Sig Dispense Refill  . cyclobenzaprine (FLEXERIL) 10 MG tablet Take 10 mg by mouth daily as needed.   0  . Levonorgestrel (KYLEENA) 19.5 MG IUD 1 Device by Intrauterine route once. 1 Intra Uterine Device 0  . nortriptyline (PAMELOR) 25 MG capsule Take 1 capsule (25 mg total) by mouth at bedtime. 90 capsule 1  . QUEtiapine (SEROQUEL) 100 MG  tablet Take 0.5 tablets (50 mg total) by mouth at bedtime. 90 tablet 1  . SUMAtriptan (IMITREX) 100 MG tablet Take 1/2 tablet at onset of migraine. May repeat in 2 hours if headache persists or recurs. 9 tablet 0  . acetaminophen (TYLENOL) 500 MG tablet Take 500 mg by mouth every 6 (six) hours as needed.    . methylphenidate (CONCERTA) 18 MG PO CR tablet Take 1 tablet (18 mg total) by mouth daily. (Patient not taking: Reported on 10/14/2017) 30 tablet 0  . promethazine (PHENERGAN) 25 MG tablet Take 1 tablet (25 mg total) by mouth every 8 (eight) hours as needed for nausea or vomiting. (Patient not taking: Reported on 07/07/2018) 30 tablet 3   No current facility-administered medications on file prior to visit.     Review of Systems Patient denies any headaches, blurred vision, shortness of breath, chest pain, abdominal pain, problems with bowel movements, urination, or intercourse.  Objective:  BP 117/83   Pulse 88   Ht 5\' 2"  (1.575 m)   Wt 235 lb 9.6 oz (106.9 kg)   BMI 43.09 kg/m  Physical Exam  General:  Well developed, well nourished, no acute distress. She is alert and oriented x3. Skin:  Warm and dry Neck:  Midline trachea, no thyromegaly or nodules Cardiovascular: Regular rate and rhythm, no murmur heard Lungs:  Effort normal, all lung fields clear to auscultation bilaterally Breasts:  No dominant palpable mass, retraction, or nipple discharge Abdomen:  Soft, non tender, no  hepatosplenomegaly or masses Pelvic:  External genitalia is normal in appearance.  The vagina is normal in appearance. The cervix is bulbous, no CMT.  Thin prep pap is done with HR HPV cotesting. Uterus is felt to be normal size, shape, and contour.  No adnexal masses or tenderness noted.IUD sting noted  Extremities:  No swelling or varicosities noted Psych:  She has a normal mood and affect Waist 45 inches Assessment:   Healthy well-woman exam BMI 43 IUD check H/o PCOS  Plan:  Will restart weight loss  program at this time. RTC in 4 weeks for wt/bp/B12. B12 injection given today. F/U 1 year for AE, or sooner if needed   Asyia Hornung Suzan Nailer, CNM

## 2018-09-15 NOTE — Patient Instructions (Addendum)
 Preventive Care 18-39 Years, Female Preventive care refers to lifestyle choices and visits with your health care provider that can promote health and wellness. What does preventive care include?   A yearly physical exam. This is also called an annual well check.  Dental exams once or twice a year.  Routine eye exams. Ask your health care provider how often you should have your eyes checked.  Personal lifestyle choices, including: ? Daily care of your teeth and gums. ? Regular physical activity. ? Eating a healthy diet. ? Avoiding tobacco and drug use. ? Limiting alcohol use. ? Practicing safe sex. ? Taking vitamin and mineral supplements as recommended by your health care provider. What happens during an annual well check? The services and screenings done by your health care provider during your annual well check will depend on your age, overall health, lifestyle risk factors, and family history of disease. Counseling Your health care provider may ask you questions about your:  Alcohol use.  Tobacco use.  Drug use.  Emotional well-being.  Home and relationship well-being.  Sexual activity.  Eating habits.  Work and work environment.  Method of birth control.  Menstrual cycle.  Pregnancy history. Screening You may have the following tests or measurements:  Height, weight, and BMI.  Diabetes screening. This is done by checking your blood sugar (glucose) after you have not eaten for a while (fasting).  Blood pressure.  Lipid and cholesterol levels. These may be checked every 5 years starting at age 20.  Skin check.  Hepatitis C blood test.  Hepatitis B blood test.  Sexually transmitted disease (STD) testing.  BRCA-related cancer screening. This may be done if you have a family history of breast, ovarian, tubal, or peritoneal cancers.  Pelvic exam and Pap test. This may be done every 3 years starting at age 21. Starting at age 30, this may be done  every 5 years if you have a Pap test in combination with an HPV test. Discuss your test results, treatment options, and if necessary, the need for more tests with your health care provider. Vaccines Your health care provider may recommend certain vaccines, such as:  Influenza vaccine. This is recommended every year.  Tetanus, diphtheria, and acellular pertussis (Tdap, Td) vaccine. You may need a Td booster every 10 years.  Varicella vaccine. You may need this if you have not been vaccinated.  HPV vaccine. If you are 26 or younger, you may need three doses over 6 months.  Measles, mumps, and rubella (MMR) vaccine. You may need at least one dose of MMR. You may also need a second dose.  Pneumococcal 13-valent conjugate (PCV13) vaccine. You may need this if you have certain conditions and were not previously vaccinated.  Pneumococcal polysaccharide (PPSV23) vaccine. You may need one or two doses if you smoke cigarettes or if you have certain conditions.  Meningococcal vaccine. One dose is recommended if you are age 19-21 years and a first-year college student living in a residence hall, or if you have one of several medical conditions. You may also need additional booster doses.  Hepatitis A vaccine. You may need this if you have certain conditions or if you travel or work in places where you may be exposed to hepatitis A.  Hepatitis B vaccine. You may need this if you have certain conditions or if you travel or work in places where you may be exposed to hepatitis B.  Haemophilus influenzae type b (Hib) vaccine. You may need this if   you have certain risk factors. Talk to your health care provider about which screenings and vaccines you need and how often you need them. This information is not intended to replace advice given to you by your health care provider. Make sure you discuss any questions you have with your health care provider. Document Released: 10/14/2001 Document Revised:  03/31/2017 Document Reviewed: 06/19/2015 Elsevier Interactive Patient Education  2019 [place] Following a healthy eating pattern may help you to achieve and maintain a healthy body weight, reduce the risk of chronic disease, and live a long and productive life. It is important to follow a healthy eating pattern at an appropriate calorie level for your body. Your nutritional needs should be met primarily through food by choosing a variety of nutrient-rich foods. What are tips for following this plan? Reading food labels  Read labels and choose the following: ? Reduced or low sodium. ? Juices with 100% fruit juice. ? Foods with low saturated fats and high polyunsaturated and monounsaturated fats. ? Foods with whole grains, such as whole wheat, cracked wheat, brown rice, and wild rice. ? Whole grains that are fortified with folic acid. This is recommended for women who are pregnant or who want to become pregnant.  Read labels and avoid the following: ? Foods with a lot of added sugars. These include foods that contain brown sugar, corn sweetener, corn syrup, dextrose, fructose, glucose, high-fructose corn syrup, honey, invert sugar, lactose, malt syrup, maltose, molasses, raw sugar, sucrose, trehalose, or turbinado sugar.  Do not eat more than the following amounts of added sugar per day:  6 teaspoons (25 g) for women.  9 teaspoons (38 g) for men. ? Foods that contain processed or refined starches and grains. ? Refined grain products, such as white flour, degermed cornmeal, white bread, and white rice. Shopping  Choose nutrient-rich snacks, such as vegetables, whole fruits, and nuts. Avoid high-calorie and high-sugar snacks, such as potato chips, fruit snacks, and candy.  Use oil-based dressings and spreads on foods instead of solid fats such as butter, stick margarine, or cream cheese.  Limit pre-made sauces, mixes, and "instant" products such as flavored rice,  instant noodles, and ready-made pasta.  Try more plant-protein sources, such as tofu, tempeh, black beans, edamame, lentils, nuts, and seeds.  Explore eating plans such as the Mediterranean diet or vegetarian diet. Cooking  Use oil to saut or stir-fry foods instead of solid fats such as butter, stick margarine, or lard.  Try baking, boiling, grilling, or broiling instead of frying.  Remove the fatty part of meats before cooking.  Steam vegetables in water or broth. Meal planning   At meals, imagine dividing your plate into fourths: ? One-half of your plate is fruits and vegetables. ? One-fourth of your plate is whole grains. ? One-fourth of your plate is protein, especially lean meats, poultry, eggs, tofu, beans, or nuts.  Include low-fat dairy as part of your daily diet. Lifestyle  Choose healthy options in all settings, including home, work, school, restaurants, or stores.  Prepare your food safely: ? Wash your hands after handling raw meats. ? Keep food preparation surfaces clean by regularly washing with hot, soapy water. ? Keep raw meats separate from ready-to-eat foods, such as fruits and vegetables. ? Cook seafood, meat, poultry, and eggs to the recommended internal temperature. ? Store foods at safe temperatures. In general:  Keep cold foods at 24F (4.4C) or below.  Keep hot foods at 124F (60C) or  above.  Keep your freezer at [facility] (-17.8C) or below.  Foods are no longer safe to eat when they have been between the temperatures of 40-140F (4.4-60C) for more than 2 hours. What foods should I eat? Fruits Aim to eat 2 cup-equivalents of fresh, canned (in natural juice), or frozen fruits each day. Examples of 1 cup-equivalent of fruit include 1 small apple, 8 large strawberries, 1 cup canned fruit,  cup dried fruit, or 1 cup 100% juice. Vegetables Aim to eat 2-3 cup-equivalents of fresh and frozen vegetables each day, including different varieties and colors.  Examples of 1 cup-equivalent of vegetables include 2 medium carrots, 2 cups raw, leafy greens, 1 cup chopped vegetable (raw or cooked), or 1 medium baked potato. Grains Aim to eat 6 ounce-equivalents of whole grains each day. Examples of 1 ounce-equivalent of grains include 1 slice of bread, 1 cup ready-to-eat cereal, 3 cups popcorn, or  cup cooked rice, pasta, or cereal. Meats and other proteins Aim to eat 5-6 ounce-equivalents of protein each day. Examples of 1 ounce-equivalent of protein include 1 egg, 1/2 cup nuts or seeds, or 1 tablespoon (16 g) peanut butter. A cut of meat or fish that is the size of a deck of cards is about 3-4 ounce-equivalents.  Of the protein you eat each week, try to have at least 8 ounces come from seafood. This includes salmon, trout, herring, and anchovies. Dairy Aim to eat 3 cup-equivalents of fat-free or low-fat dairy each day. Examples of 1 cup-equivalent of dairy include 1 cup (240 mL) milk, 8 ounces (250 g) yogurt, 1 ounces (44 g) natural cheese, or 1 cup (240 mL) fortified soy milk. Fats and oils  Aim for about 5 teaspoons (21 g) per day. Choose monounsaturated fats, such as canola and olive oils, avocados, peanut butter, and most nuts, or polyunsaturated fats, such as sunflower, corn, and soybean oils, walnuts, pine nuts, sesame seeds, sunflower seeds, and flaxseed. Beverages  Aim for six 8-oz glasses of water per day. Limit coffee to three to five 8-oz cups per day.  Limit caffeinated beverages that have added calories, such as soda and energy drinks.  Limit alcohol intake to no more than 1 drink a day for nonpregnant women and 2 drinks a day for men. One drink equals 12 oz of beer (355 mL), 5 oz of wine (148 mL), or 1 oz of hard liquor (44 mL). Seasoning and other foods  Avoid adding excess amounts of salt to your foods. Try flavoring foods with herbs and spices instead of salt.  Avoid adding sugar to foods.  Try using oil-based dressings, sauces,  and spreads instead of solid fats. This information is based on general [place] nutrition guidelines. For more information, visit [employer]. Exact amounts may vary based on your nutrition needs. Summary  A healthy eating plan may help you to maintain a healthy weight, reduce the risk of chronic diseases, and stay active throughout your life.  Plan your meals. Make sure you eat the right portions of a variety of nutrient-rich foods.  Try baking, boiling, grilling, or broiling instead of frying.  Choose healthy options in all settings, including home, work, school, restaurants, or stores. This information is not intended to replace advice given to you by your health care provider. Make sure you discuss any questions you have with your health care provider. Document Released: 11/30/2017 Document Revised: 11/30/2017 Document Reviewed: 11/30/2017 Elsevier Interactive Patient Education  2019 [employer].

## 2018-09-20 LAB — CYTOLOGY - PAP
DIAGNOSIS: NEGATIVE
HPV: NOT DETECTED

## 2018-10-07 ENCOUNTER — Ambulatory Visit
Admission: RE | Admit: 2018-10-07 | Discharge: 2018-10-07 | Disposition: A | Discharge status: Home / Self Care | Payer: Self-pay | Source: Ambulatory Visit | Attending: Family Medicine | Admitting: Family Medicine

## 2018-10-07 ENCOUNTER — Other Ambulatory Visit: Payer: Self-pay

## 2018-10-07 ENCOUNTER — Ambulatory Visit (INDEPENDENT_AMBULATORY_CARE_PROVIDER_SITE_OTHER): Payer: Self-pay | Admitting: Family Medicine

## 2018-10-07 ENCOUNTER — Encounter: Payer: Self-pay | Admitting: Family Medicine

## 2018-10-07 ENCOUNTER — Ambulatory Visit: Payer: Self-pay | Admitting: Family Medicine

## 2018-10-07 VITALS — BP 134/84 | HR 105 | Temp 98.8°F | Ht 62.0 in | Wt 230.2 lb

## 2018-10-07 DIAGNOSIS — S6992XA Unspecified injury of left wrist, hand and finger(s), initial encounter: Secondary | ICD-10-CM | POA: Insufficient documentation

## 2018-10-07 NOTE — Progress Notes (Signed)
BP 134/84 (BP Location: Left Arm, Patient Position: Sitting, Cuff Size: Large)   Pulse (!) 105   Temp 98.8 F (37.1 C)   Ht 5\' 2"  (1.575 m)   Wt 230 lb 4 oz (104.4 kg)   SpO2 100%   BMI 42.11 kg/m    Subjective:    Patient ID: Alexis Rangel, female    DOB: 03-Aug-1986, 33 y.o.   MRN: [id number]  HPI: Alexis Rangel is a 33 y.o. female  Chief Complaint  Patient presents with  . Hand Pain    left hand middle finger, patient's dog yanked and twisted her finger   2 days ago, Elmarie Shileyiffany was going to put drops in her dog's eye and he jerked away getting her finger caught in his collar. It has been hurting, bruised and swollen. Has been feeling a little better since someone at work pulled on it. No numbness, she is able to move and bend it. Otherwise feeling well with no other concerns or complaints at this time.   Relevant past medical, surgical, family and social history reviewed and updated as indicated. Interim medical history since our last visit reviewed. Allergies and medications reviewed and updated.  Review of Systems  Constitutional: Negative.   Respiratory: Negative.   Cardiovascular: Negative.   Musculoskeletal: Positive for arthralgias and joint swelling. Negative for back pain, gait problem, myalgias, neck pain and neck stiffness.  Psychiatric/Behavioral: Negative.     Per HPI unless specifically indicated above     Objective:    BP 134/84 (BP Location: Left Arm, Patient Position: Sitting, Cuff Size: Large)   Pulse (!) 105   Temp 98.8 F (37.1 C)   Ht 5\' 2"  (1.575 m)   Wt 230 lb 4 oz (104.4 kg)   SpO2 100%   BMI 42.11 kg/m   Wt Readings from Last 3 Encounters:  10/07/18 230 lb 4 oz (104.4 kg)  09/15/18 235 lb 9.6 oz (106.9 kg)  07/07/18 234 lb (106.1 kg)    Physical Exam Vitals signs and nursing note reviewed.  Constitutional:      General: She is not in acute distress.    Appearance: Normal appearance. She is not ill-appearing, toxic-appearing  or diaphoretic.  HENT:     Head: Normocephalic and atraumatic.     Right Ear: External ear normal.     Left Ear: External ear normal.     Nose: Nose normal.     Mouth/Throat:     Mouth: Mucous membranes are moist.     Pharynx: Oropharynx is clear.  Eyes:     General: No scleral icterus.       Right eye: No discharge.        Left eye: No discharge.     Extraocular Movements: Extraocular movements intact.     Conjunctiva/sclera: Conjunctivae normal.     Pupils: Pupils are equal, round, and reactive to light.  Neck:     Musculoskeletal: Normal range of motion and neck supple.  Cardiovascular:     Rate and Rhythm: Normal rate and regular rhythm.     Pulses: Normal pulses.     Heart sounds: Normal heart sounds. No murmur. No friction rub. No gallop.   Pulmonary:     Effort: Pulmonary effort is normal. No respiratory distress.     Breath sounds: Normal breath sounds. No stridor. No wheezing, rhonchi or rales.  Chest:     Chest wall: No tenderness.  Musculoskeletal: Normal range of motion.  General: Swelling and tenderness present.     Comments: Mild tenderness, bruising and swelling between the DIP and PIP of L middle finger, FROM  Skin:    General: Skin is warm and dry.     Capillary Refill: Capillary refill takes less than 2 seconds.     Coloration: Skin is not jaundiced or pale.     Findings: No bruising, erythema, lesion or rash.  Neurological:     General: No focal deficit present.     Mental Status: She is alert and oriented to person, place, and time. Mental status is at baseline.  Psychiatric:        Mood and Affect: Mood normal.        Behavior: Behavior normal.        Thought Content: Thought content normal.        Judgment: Judgment normal.     Results for orders placed or performed in visit on 09/15/18  Cytology - PAP  Result Value Ref Range   Adequacy      Satisfactory for evaluation  endocervical/transformation zone component PRESENT.   Diagnosis       NEGATIVE FOR INTRAEPITHELIAL LESIONS OR MALIGNANCY.   Diagnosis      FUNGAL ORGANISMS PRESENT CONSISTENT WITH CANDIDA SPP.   HPV NOT DETECTED    Material Submitted CervicoVaginal Pap [ThinPrep Imaged]    CYTOLOGY - PAP PAP RESULT       Assessment & Plan:   Problem List Items Addressed This Visit    None    Visit Diagnoses    Injury of finger of left hand, initial encounter    -  Primary   Likely deep bruise. Will obtain x-ray. ICE, ibuprofen, rest. Call with any concerns.    Relevant Orders   DG Finger Middle Left       Follow up plan: Return if symptoms worsen or fail to improve.

## 2018-10-13 ENCOUNTER — Ambulatory Visit: Payer: Self-pay | Admitting: Obstetrics and Gynecology

## 2018-10-14 ENCOUNTER — Encounter: Payer: Self-pay | Admitting: Obstetrics and Gynecology

## 2018-10-14 MED ORDER — CYANOCOBALAMIN 1000 MCG/ML IJ SOLN
1000.0000 ug | Freq: Once | INTRAMUSCULAR | Status: AC
  Administered 2018-10-14: 1000 ug via INTRAMUSCULAR

## 2018-10-14 NOTE — Progress Notes (Signed)
Pt is here for wt, bp check, b-12 inj She denies any s/e, doing well  10/13/18 wt- 229.3lb 09/15/18 wt- 235lb  Waist 44

## 2018-11-15 ENCOUNTER — Other Ambulatory Visit: Payer: Self-pay

## 2018-11-15 ENCOUNTER — Ambulatory Visit (INDEPENDENT_AMBULATORY_CARE_PROVIDER_SITE_OTHER): Payer: Self-pay | Admitting: Family Medicine

## 2018-11-15 ENCOUNTER — Encounter: Payer: Self-pay | Admitting: Family Medicine

## 2018-11-15 VITALS — BP 132/91 | HR 104 | Temp 98.4°F

## 2018-11-15 DIAGNOSIS — F3181 Bipolar II disorder: Secondary | ICD-10-CM

## 2018-11-15 MED ORDER — QUETIAPINE FUMARATE 100 MG PO TABS: 100.0000 mg | ORAL_TABLET | Freq: Every day | ORAL | 1 refills | Status: DC

## 2018-11-15 MED ORDER — LURASIDONE HCL 20 MG PO TABS: ORAL_TABLET | ORAL | 3 refills | Status: DC

## 2018-11-15 MED ORDER — HYDROXYZINE HCL 25 MG PO TABS: 25.0000 mg | ORAL_TABLET | Freq: Three times a day (TID) | ORAL | 2 refills | Status: DC | PRN
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
## 2018-11-15 NOTE — Patient Instructions (Signed)
[email or web address] 

## 2018-11-15 NOTE — Assessment & Plan Note (Signed)
Doing worse. Will increase her seroquel to 100mg  daily, start hydroxyzine PRN and start latuda. Call with any concerns. Recheck 1 month.

## 2018-12-16 ENCOUNTER — Other Ambulatory Visit: Payer: Self-pay

## 2018-12-16 ENCOUNTER — Encounter: Payer: Self-pay | Admitting: Family Medicine

## 2018-12-16 ENCOUNTER — Ambulatory Visit (INDEPENDENT_AMBULATORY_CARE_PROVIDER_SITE_OTHER): Payer: Self-pay | Admitting: Family Medicine

## 2018-12-16 VITALS — BP 144/94 | HR 124 | Temp 98.1°F | Wt 224.0 lb

## 2018-12-16 DIAGNOSIS — F3181 Bipolar II disorder: Secondary | ICD-10-CM

## 2018-12-16 DIAGNOSIS — G43109 Migraine with aura, not intractable, without status migrainosus: Secondary | ICD-10-CM

## 2018-12-16 MED ORDER — HYDROXYZINE HCL 25 MG PO TABS: 25.0000 mg | ORAL_TABLET | Freq: Three times a day (TID) | ORAL | 6 refills | Status: DC | PRN

## 2018-12-16 MED ORDER — SUMATRIPTAN SUCCINATE 100 MG PO TABS: ORAL_TABLET | ORAL | 12 refills | Status: DC

## 2018-12-16 MED ORDER — QUETIAPINE FUMARATE 100 MG PO TABS: 150.0000 mg | ORAL_TABLET | Freq: Every day | ORAL | 1 refills | Status: DC

## 2018-12-16 MED ORDER — NORTRIPTYLINE HCL 25 MG PO CAPS: 25.0000 mg | ORAL_CAPSULE | Freq: Every day | ORAL | 1 refills | Status: DC

## 2018-12-16 NOTE — Progress Notes (Signed)
BP (!) 144/94   Pulse (!) 124   Temp 98.1 F (36.7 C)   Wt 224 lb (101.6 kg)   BMI 40.97 kg/m    Subjective:    Patient ID: Alexis Rangel, female    DOB: 07/02/1986, 33 y.o.   MRN: [id number]  HPI: [name] is a 33 y.o. female  Chief Complaint  Patient presents with  . mood follow up   BIPOLAR- was not able to get the latuda due to cost Mood status: better Satisfied with current treatment?: no Symptom severity: mild  Duration of current treatment : chronic Side effects: no Medication compliance: excellent compliance Psychotherapy/counseling: no  Previous psychiatric medications: seroquel, hydroxyine Depressed mood: yes Anxious mood: yes Anhedonia: no Significant weight loss or gain: no Insomnia: no  Fatigue: yes Feelings of worthlessness or guilt: no Impaired concentration/indecisiveness: no Suicidal ideations: no Hopelessness: no Crying spells: no Depression screen [facility] 2/9 12/16/2018 11/15/2018 07/16/2017 06/22/2017 04/21/2017  Decreased Interest 0 2 2 3 2   Down, Depressed, Hopeless 2 3 3 3 3   PHQ - 2 Score 2 5 5 6 5   Altered sleeping 1 2 1 3 1   Tired, decreased energy 0 2 2 2 2   Change in appetite 0 1 3 3 3   Feeling bad or failure about yourself  0 3 3 3 2   Trouble concentrating 0 1 1 2 2   Moving slowly or fidgety/restless 0 1 2 2 1   Suicidal thoughts 0 0 2 3 1   PHQ-9 Score 3 15 19 24 17   Difficult doing work/chores Not difficult at all - Somewhat difficult Extremely dIfficult -   GAD 7 : Generalized Anxiety Score 11/15/2018  Nervous, Anxious, on Edge 2  Control/stop worrying 3  Worry too much - different things 3  Trouble relaxing 3  Restless 2  Easily annoyed or irritable 3  Afraid - awful might happen 3  Total GAD 7 Score 19  Anxiety Difficulty Very difficult    Relevant past medical, surgical, family and social history reviewed and updated as indicated. Interim medical history since our last visit reviewed. Allergies and  medications reviewed and updated.  Review of Systems  Constitutional: Negative.   Respiratory: Negative.   Cardiovascular: Negative.   Musculoskeletal: Negative.   Skin: Negative.   Neurological: Negative.   Psychiatric/Behavioral: Positive for dysphoric mood. Negative for agitation, behavioral problems, confusion, decreased concentration, hallucinations, self-injury, sleep disturbance and suicidal ideas. The patient is nervous/anxious. The patient is not hyperactive.     Per HPI unless specifically indicated above     Objective:    BP (!) 144/94   Pulse (!) 124   Temp 98.1 F (36.7 C)   Wt 224 lb (101.6 kg)   BMI 40.97 kg/m   Wt Readings from Last 3 Encounters:  12/16/18 224 lb (101.6 kg)  10/14/18 229 lb 4.8 oz (104 kg)  10/07/18 230 lb 4 oz (104.4 kg)    Physical Exam Vitals signs and nursing note reviewed.  Constitutional:      General: She is not in acute distress.    Appearance: Normal appearance. She is not ill-appearing, toxic-appearing or diaphoretic.  HENT:     Head: Normocephalic and atraumatic.     Right Ear: External ear normal.     Left Ear: External ear normal.     Nose: Nose normal.     Mouth/Throat:     Mouth: Mucous membranes are moist.     Pharynx: Oropharynx is clear.  Eyes:  General: No scleral icterus.       Right eye: No discharge.        Left eye: No discharge.     Conjunctiva/sclera: Conjunctivae normal.     Pupils: Pupils are equal, round, and reactive to light.  Neck:     Musculoskeletal: Normal range of motion.  Pulmonary:     Effort: Pulmonary effort is normal. No respiratory distress.     Comments: Speaking in full sentences Musculoskeletal: Normal range of motion.  Skin:    Coloration: Skin is not jaundiced or pale.     Findings: No bruising, erythema, lesion or rash.  Neurological:     Mental Status: She is alert and oriented to person, place, and time. Mental status is at baseline.  Psychiatric:        Mood and Affect:  Mood normal.        Behavior: Behavior normal.        Thought Content: Thought content normal.        Judgment: Judgment normal.     Results for orders placed or performed in visit on 09/15/18  Cytology - PAP  Result Value Ref Range   Adequacy      Satisfactory for evaluation  endocervical/transformation zone component PRESENT.   Diagnosis      NEGATIVE FOR INTRAEPITHELIAL LESIONS OR MALIGNANCY.   Diagnosis      FUNGAL ORGANISMS PRESENT CONSISTENT WITH CANDIDA SPP.   HPV NOT DETECTED    Material Submitted CervicoVaginal Pap [ThinPrep Imaged]    CYTOLOGY - PAP PAP RESULT       Assessment & Plan:   Problem List Items Addressed This Visit      Cardiovascular and Mediastinum   Migraine    Doing well on current regimen. Continue to monitor. Refills given today. Call with any concerns.       Relevant Medications   nortriptyline (PAMELOR) 25 MG capsule   SUMAtriptan (IMITREX) 100 MG tablet     Other   Bipolar 2 disorder (HCC) - Primary    Doing better on current regimen. Continue to monitor. Will increase her seroquel to 150mg  qHS and increase hydroxyzine to 25-50mg  PRN. Call with any concerns. Recheck 1 month.           Follow up plan: Return in about 4 weeks (around 01/13/2019) for follow up mood.   . This visit was completed via FaceTime due to the restrictions of the COVID-19 pandemic. All issues as above were discussed and addressed. Physical exam was done as above through visual confirmation on FaceTime. If it was felt that the patient should be evaluated in the office, they were directed there. The patient verbally consented to this visit. . Location of the patient: home . Location of the provider: home . Those involved with this call:  . Provider: Olevia PerchesMegan Johnson, DO . CMA: Zamariah Reel, CMA . Front Desk/Registration: Harriet PhoJoliza Johnson  . Time spent on call: 15 minutes with patient face to face via video conference. More than 50% of this time was spent in counseling  and coordination of care. 23 minutes total spent in review of patient's record and preparation of their chart.

## 2018-12-16 NOTE — Assessment & Plan Note (Signed)
Doing well on current regimen. Continue to monitor. Refills given today. Call with any concerns.

## 2018-12-16 NOTE — Assessment & Plan Note (Signed)
Doing better on current regimen. Continue to monitor. Will increase her seroquel to 150mg  qHS and increase hydroxyzine to 25-50mg  PRN. Call with any concerns. Recheck 1 month.

## 2019-01-17 ENCOUNTER — Encounter: Payer: Self-pay | Admitting: Family Medicine

## 2019-01-17 ENCOUNTER — Ambulatory Visit (INDEPENDENT_AMBULATORY_CARE_PROVIDER_SITE_OTHER): Payer: Self-pay | Admitting: Family Medicine

## 2019-01-17 ENCOUNTER — Other Ambulatory Visit: Payer: Self-pay

## 2019-01-17 VITALS — BP 132/85 | HR 97 | Temp 98.7°F | Ht 62.0 in | Wt 227.0 lb

## 2019-01-17 DIAGNOSIS — F3181 Bipolar II disorder: Secondary | ICD-10-CM

## 2019-01-17 DIAGNOSIS — M25572 Pain in left ankle and joints of left foot: Secondary | ICD-10-CM

## 2019-01-17 DIAGNOSIS — E538 Deficiency of other specified B group vitamins: Secondary | ICD-10-CM | POA: Insufficient documentation

## 2019-01-17 MED ORDER — "SYRINGE 25G X 1"" 3 ML MISC": 1.0000 | 1 refills | Status: DC

## 2019-01-17 NOTE — Progress Notes (Signed)
BP 132/85   Pulse 97   Temp 98.7 F (37.1 C) (Oral)   Ht 5\' 2"  (1.575 m)   Wt 227 lb (103 kg)   SpO2 99%   BMI 41.52 kg/m    Subjective:    Patient ID: Alexis Rangel, female    DOB: 27-Mar-1986, 33 y.o.   MRN: [id number]  HPI: Alexis Rangel is a 33 y.o. female  Chief Complaint  Patient presents with  . Mood    f/u   ANXIETY/BIPOLAR Duration:better Anxious mood: no  Excessive worrying: no Irritability: no  Sweating: no Nausea: no Palpitations:no Hyperventilation: no Panic attacks: no Agoraphobia: no  Obscessions/compulsions: no Depressed mood: no Depression screen [facility] 2/9 01/17/2019 12/16/2018 11/15/2018 07/16/2017 06/22/2017  Decreased Interest 0 0 2 2 3   Down, Depressed, Hopeless 1 2 3 3 3   PHQ - 2 Score 1 2 5 5 6   Altered sleeping 1 1 2 1 3   Tired, decreased energy 1 0 2 2 2   Change in appetite 1 0 1 3 3   Feeling bad or failure about yourself  1 0 3 3 3   Trouble concentrating 0 0 1 1 2   Moving slowly or fidgety/restless 1 0 1 2 2   Suicidal thoughts 0 0 0 2 3  PHQ-9 Score 6 3 15 19 24   Difficult doing work/chores Not difficult at all Not difficult at all - Somewhat difficult Extremely dIfficult   Anhedonia: no Weight changes: no Insomnia: no   Hypersomnia: no Fatigue/loss of energy: no Feelings of worthlessness: no Feelings of guilt: no Impaired concentration/indecisiveness: no Suicidal ideations: no  Crying spells: no Recent Stressors/Life Changes: no   Relationship problems: no   Family stress: no     Financial stress: no    Job stress: no    Recent death/loss: no  Fell in [place] in [place] on Mothers day. Fell into a hole outside [employer]. Twisted her ankle. She notes that it was swollen. Hurts when she walks on it a little, hurts when she's been sitting for a while. No numbness or tingling. No bruising. She is otherwise feeling well with no other concerns or complaints at this time.   Relevant past medical, surgical, family and  social history reviewed and updated as indicated. Interim medical history since our last visit reviewed. Allergies and medications reviewed and updated.  Review of Systems  Constitutional: Negative.   Respiratory: Negative.   Cardiovascular: Negative.   Neurological: Negative.   Psychiatric/Behavioral: Negative.     Per HPI unless specifically indicated above     Objective:    BP 132/85   Pulse 97   Temp 98.7 F (37.1 C) (Oral)   Ht 5\' 2"  (1.575 m)   Wt 227 lb (103 kg)   SpO2 99%   BMI 41.52 kg/m   Wt Readings from Last 3 Encounters:  01/17/19 227 lb (103 kg)  12/16/18 224 lb (101.6 kg)  10/14/18 229 lb 4.8 oz (104 kg)    Physical Exam Vitals signs and nursing note reviewed.  Constitutional:      General: She is not in acute distress.    Appearance: Normal appearance. She is not ill-appearing, toxic-appearing or diaphoretic.  HENT:     Head: Normocephalic and atraumatic.     Right Ear: External ear normal.     Left Ear: External ear normal.     Nose: Nose normal.     Mouth/Throat:     Mouth: Mucous membranes are moist.  Pharynx: Oropharynx is clear.  Eyes:     General: No scleral icterus.       Right eye: No discharge.        Left eye: No discharge.     Extraocular Movements: Extraocular movements intact.     Conjunctiva/sclera: Conjunctivae normal.     Pupils: Pupils are equal, round, and reactive to light.  Neck:     Musculoskeletal: Normal range of motion and neck supple.  Cardiovascular:     Rate and Rhythm: Normal rate and regular rhythm.     Pulses: Normal pulses.     Heart sounds: Normal heart sounds. No murmur. No friction rub. No gallop.   Pulmonary:     Effort: Pulmonary effort is normal. No respiratory distress.     Breath sounds: Normal breath sounds. No stridor. No wheezing, rhonchi or rales.  Chest:     Chest wall: No tenderness.  Musculoskeletal:        General: Swelling (mild throughout ankle) and tenderness (lateral maleolus) present.      Comments: Slightly increased range of motion to medial rotation   Skin:    General: Skin is warm and dry.     Capillary Refill: Capillary refill takes less than 2 seconds.     Coloration: Skin is not jaundiced or pale.     Findings: No bruising, erythema, lesion or rash.  Neurological:     General: No focal deficit present.     Mental Status: She is alert and oriented to person, place, and time. Mental status is at baseline.  Psychiatric:        Mood and Affect: Mood normal.        Behavior: Behavior normal.        Thought Content: Thought content normal.        Judgment: Judgment normal.     Results for orders placed or performed in visit on 09/15/18  Cytology - PAP  Result Value Ref Range   Adequacy      Satisfactory for evaluation  endocervical/transformation zone component PRESENT.   Diagnosis      NEGATIVE FOR INTRAEPITHELIAL LESIONS OR MALIGNANCY.   Diagnosis      FUNGAL ORGANISMS PRESENT CONSISTENT WITH CANDIDA SPP.   HPV NOT DETECTED    Material Submitted CervicoVaginal Pap [ThinPrep Imaged]    CYTOLOGY - PAP PAP RESULT       Assessment & Plan:   Problem List Items Addressed This Visit      Other   Bipolar 2 disorder (HCC) - Primary    Doing much better. Stable on current regimen. Continue current regimen. Continue to monitor. Call with any concerns.      B12 deficiency    Has been on shots- taught today how to self-administer. Rx for syringes and needles given today.       Other Visit Diagnoses    Acute left ankle pain       Likely a sprain. Given where it happened, will obtain x-ray. Await results. Call with any concerns. Continue to monitor.    Relevant Orders   DG Ankle Complete Left       Follow up plan: Return in about 6 months (around 07/20/2019) for Physical.

## 2019-01-17 NOTE — Assessment & Plan Note (Signed)
Has been on shots- taught today how to self-administer. Rx for syringes and needles given today.

## 2019-01-17 NOTE — Assessment & Plan Note (Signed)
Doing much better. Stable on current regimen. Continue current regimen. Continue to monitor. Call with any concerns.

## 2019-04-06 ENCOUNTER — Encounter: Payer: Self-pay | Admitting: Nurse Practitioner

## 2019-04-06 ENCOUNTER — Other Ambulatory Visit: Payer: Self-pay

## 2019-04-06 ENCOUNTER — Ambulatory Visit (INDEPENDENT_AMBULATORY_CARE_PROVIDER_SITE_OTHER): Payer: Self-pay | Admitting: Nurse Practitioner

## 2019-04-06 DIAGNOSIS — B353 Tinea pedis: Secondary | ICD-10-CM | POA: Insufficient documentation

## 2019-04-06 NOTE — Assessment & Plan Note (Signed)
Bilateral L>R.  Recommend trial of OTC Lamisil to start, spray or cream.  Educated on proper hygiene and foot care to prevent recurrence.  Drying feet well after showering, allowing feet to have air.  Return to office for worsening or continued symptoms.

## 2019-04-06 NOTE — Patient Instructions (Signed)
Athlete's Foot  Athlete's foot (tinea pedis) is a fungal infection of the skin on your feet. It often occurs on the skin that is between or underneath your toes. It can also occur on the soles of your feet. Symptoms include itchy or white and flaky areas on the skin. The infection can spread from person to person (is contagious). It can also spread when a person's bare feet come in contact with the fungus on shower floors or on items such as shoes. Follow these instructions at home: Medicines  Apply or take over-the-counter and prescription medicines only as told by your doctor.  Apply your antifungal medicine as told by your doctor. Do not stop using the medicine even if your feet start to get better. Foot care  Do not scratch your feet.  Keep your feet dry: ? Wear cotton or wool socks. Change your socks every day or if they become wet. ? Wear shoes that allow air to move around, such as sandals or canvas tennis shoes.  Wash and dry your feet: ? Every day or as told by your doctor. ? After exercising. ? Including the area between your toes. General instructions  Do not share any of these items that touch your feet: ? Towels. ? Shoes. ? Nail clippers. ? Other personal items.  Protect your feet by wearing sandals in wet areas, such as locker rooms and shared showers.  Keep all follow-up visits as told by your doctor. This is important.  If you have diabetes, keep your blood sugar under control. Contact a doctor if:  You have a fever.  You have swelling, pain, warmth, or redness in your foot.  Your feet are not getting better with treatment.  Your symptoms get worse.  You have new symptoms. Summary  Athlete's foot is a fungal infection of the skin on your feet.  Symptoms include itchy or white and flaky areas on the skin.  Apply your antifungal medicine as told by your doctor.  Keep your feet clean and dry. This information is not intended to replace advice given  to you by your health care provider. Make sure you discuss any questions you have with your health care provider. Document Released: 02/04/2008 Document Revised: 08/13/2017 Document Reviewed: 06/08/2017 Elsevier Patient Education  2020 [employer] Inc.  

## 2019-04-06 NOTE — Progress Notes (Signed)
BP 136/89   Pulse 86   Temp 99 F (37.2 C) (Oral)   SpO2 98%    Subjective:    Patient ID: Alexis Rangel, female    DOB: 08/26/1986, 33 y.o.   MRN: [id number]  HPI: Alexis Rangel is a 33 y.o. female  Chief Complaint  Patient presents with  . Foot Problem    R foot, pt states it has been itching and swelling for the past few days, states she is not sure if she got bit by something or not   SKIN INFECTION States she was at the beach over weekend and did go in ocean quite a bit.  The irritation started after this with itching to between toes and foot, right side + some swelling and dry skin.  Reports she has similar on other side, but not as bad. Duration: days Location: left foot (toes) and some on right foot History of trauma in area: no Pain: no Quality: itchy and occasional swelling Severity: mild Redness: yes Swelling: yes Oozing: no Pus: no Fevers: no Nausea/vomiting: no Status: stable Treatments attempted:none  Tetanus: UTD  Relevant past medical, surgical, family and social history reviewed and updated as indicated. Interim medical history since our last visit reviewed. Allergies and medications reviewed and updated.  Review of Systems  Constitutional: Negative for activity change, appetite change, diaphoresis, fatigue and fever.  Respiratory: Negative for cough, chest tightness and shortness of breath.   Cardiovascular: Negative for chest pain, palpitations and leg swelling.  Gastrointestinal: Negative for abdominal distention, abdominal pain, constipation, diarrhea, nausea and vomiting.  Skin: Positive for rash.  Psychiatric/Behavioral: Negative.     Per HPI unless specifically indicated above     Objective:    BP 136/89   Pulse 86   Temp 99 F (37.2 C) (Oral)   SpO2 98%   Wt Readings from Last 3 Encounters:  01/17/19 227 lb (103 kg)  12/16/18 224 lb (101.6 kg)  10/14/18 229 lb 4.8 oz (104 kg)    Physical Exam Vitals signs and  nursing note reviewed.  Constitutional:      General: She is awake. She is not in acute distress.    Appearance: She is well-developed. She is not ill-appearing.  HENT:     Head: Normocephalic.     Right Ear: Hearing normal.     Left Ear: Hearing normal.     Nose: Nose normal.     Mouth/Throat:     Mouth: Mucous membranes are moist.  Eyes:     General: Lids are normal.        Right eye: No discharge.        Left eye: No discharge.     Conjunctiva/sclera: Conjunctivae normal.     Pupils: Pupils are equal, round, and reactive to light.  Neck:     Musculoskeletal: Normal range of motion and neck supple.     Thyroid: No thyromegaly.     Vascular: No carotid bruit.  Cardiovascular:     Rate and Rhythm: Normal rate and regular rhythm.     Heart sounds: Normal heart sounds. No murmur. No gallop.   Pulmonary:     Effort: Pulmonary effort is normal. No accessory muscle usage or respiratory distress.     Breath sounds: Normal breath sounds.  Abdominal:     General: Bowel sounds are normal.     Palpations: Abdomen is soft.  Musculoskeletal:     Right lower leg: No edema.     Left  lower leg: No edema.  Feet:     Comments: No erythema to dorsal or pedal  foot.  Erythema noted to between toes left foot with scaling and cracking appearance, noted more between 4th and 5th toes + 3rd and 4th toes.  Right foot with similar, although less erythema and cracking noted. Lymphadenopathy:     Cervical: No cervical adenopathy.  Skin:    General: Skin is warm and dry.  Neurological:     Mental Status: She is alert and oriented to person, place, and time.  Psychiatric:        Attention and Perception: Attention normal.        Mood and Affect: Mood normal.        Speech: Speech normal.        Behavior: Behavior normal. Behavior is cooperative.        Thought Content: Thought content normal.        Judgment: Judgment normal.     Results for orders placed or performed in visit on 09/15/18   Cytology - PAP  Result Value Ref Range   Adequacy      Satisfactory for evaluation  endocervical/transformation zone component PRESENT.   Diagnosis      NEGATIVE FOR INTRAEPITHELIAL LESIONS OR MALIGNANCY.   Diagnosis      FUNGAL ORGANISMS PRESENT CONSISTENT WITH CANDIDA SPP.   HPV NOT DETECTED    Material Submitted CervicoVaginal Pap [ThinPrep Imaged]    CYTOLOGY - PAP PAP RESULT       Assessment & Plan:   Problem List Items Addressed This Visit      Musculoskeletal and Integument   Tinea pedis    Bilateral L>R.  Recommend trial of OTC Lamisil to start, spray or cream.  Educated on proper hygiene and foot care to prevent recurrence.  Drying feet well after showering, allowing feet to have air.  Return to office for worsening or continued symptoms.          Follow up plan: Return if symptoms worsen or fail to improve.

## 2019-06-30 ENCOUNTER — Encounter: Payer: Self-pay | Admitting: Emergency Medicine

## 2019-06-30 ENCOUNTER — Other Ambulatory Visit: Payer: Self-pay

## 2019-06-30 ENCOUNTER — Emergency Department
Admission: EM | Admit: 2019-06-30 | Discharge: 2019-06-30 | Disposition: A | Discharge status: Home / Self Care | Payer: Self-pay | Attending: Emergency Medicine | Admitting: Emergency Medicine

## 2019-06-30 DIAGNOSIS — H9313 Tinnitus, bilateral: Secondary | ICD-10-CM | POA: Insufficient documentation

## 2019-06-30 DIAGNOSIS — G43809 Other migraine, not intractable, without status migrainosus: Secondary | ICD-10-CM | POA: Insufficient documentation

## 2019-06-30 MED ORDER — METOCLOPRAMIDE HCL 5 MG/ML IJ SOLN
10.0000 mg | Freq: Once | INTRAMUSCULAR | Status: AC
  Administered 2019-06-30: 10 mg via INTRAVENOUS
  Filled 2019-06-30: qty 2

## 2019-06-30 MED ORDER — NORTRIPTYLINE HCL 25 MG PO CAPS: 25.0000 mg | ORAL_CAPSULE | Freq: Every day | ORAL | 1 refills | Status: DC

## 2019-06-30 MED ORDER — KETOROLAC TROMETHAMINE 30 MG/ML IJ SOLN
30.0000 mg | Freq: Once | INTRAMUSCULAR | Status: AC
  Administered 2019-06-30: 30 mg via INTRAVENOUS
  Filled 2019-06-30: qty 1

## 2019-06-30 MED ORDER — SODIUM CHLORIDE 0.9 % IV SOLN
Freq: Once | INTRAVENOUS | Status: AC
  Administered 2019-06-30: 08:00:00 via INTRAVENOUS

## 2019-06-30 MED ORDER — SUMATRIPTAN SUCCINATE 100 MG PO TABS: ORAL_TABLET | ORAL | 0 refills | Status: DC

## 2019-06-30 MED ORDER — DIPHENHYDRAMINE HCL 50 MG/ML IJ SOLN
25.0000 mg | Freq: Once | INTRAMUSCULAR | Status: AC
  Administered 2019-06-30: 25 mg via INTRAVENOUS
  Filled 2019-06-30: qty 1

## 2019-06-30 NOTE — ED Provider Notes (Signed)
[facility] Emergency Department Provider Note       Time seen: ----------------------------------------- 8:14 AM on 06/30/2019 -----------------------------------------   I have reviewed the triage vital signs and the nursing notes.  HISTORY   Chief Complaint Migraine    HPI Alexis Rangel is a 33 y.o. female with a history of bipolar disorder, depression, headache, polycystic ovaries who presents to the ED for migraine that began on Monday.  Patient reports she is supposed to take medication for migraines but does not have insurance at this time.  She does have ringing in her ears, denies nausea but is sensitive to the light.  Headache is posterior and then moves forward to the front of her head consistent with her prior headaches.  Past Medical History:  Diagnosis Date  . Bipolar 1 disorder (HCC)   . Depression   . Headache   . PCO (polycystic ovaries)     Patient Active Problem List   Diagnosis Date Noted  . Tinea pedis 04/06/2019  . B12 deficiency 01/17/2019  . Adjustment disorder with mixed disturbance of emotions and conduct 06/11/2017  . Bipolar 2 disorder (HCC) 06/11/2017  . Idiopathic intracranial hypertension 05/25/2017  . Papilledema 05/01/2017  . Pelvic pain in female 05/20/2016  . Hypocalcemia 03/11/2016  . Hyperinsulinemia 03/11/2016  . PCO (polycystic ovaries)   . Family planning 12/18/2015  . Depression 12/18/2015  . Obesity 10/17/2015  . Migraine 10/17/2015  . Cholelithiasis 10/16/2015    Past Surgical History:  Procedure Laterality Date  . ccy  06/09/2014   pt states she does not remember this surgery  . CHOLECYSTECTOMY    . TONSILLECTOMY AND ADENOIDECTOMY    . WISDOM TOOTH EXTRACTION      Allergies Patient has no known allergies.  Social History Social History   Tobacco Use  . Smoking status: Never Smoker  . Smokeless tobacco: Never Used  Substance Use Topics  . Alcohol use: No  . Drug use: No    Review of Systems Constitutional: Negative for fever. Cardiovascular: Negative for chest pain. Respiratory: Negative for shortness of breath. Gastrointestinal: Negative for abdominal pain, positive for nausea Musculoskeletal: Negative for back pain. Skin: Negative for rash. Neurological: Positive for headache  All systems negative/normal/unremarkable except as stated in the HPI  ____________________________________________   PHYSICAL EXAM:  VITAL SIGNS: ED Triage Vitals  Enc Vitals Group     BP 06/30/19 0758 123/75     Pulse Rate 06/30/19 0758 (!) 101     Resp 06/30/19 0758 16     Temp 06/30/19 0758 98.4 F (36.9 C)     Temp Source 06/30/19 0758 Oral     SpO2 06/30/19 0758 98 %     Weight 06/30/19 0758 235 lb (106.6 kg)     Height 06/30/19 0758 5\' 2"  (1.575 m)     Head Circumference --      Peak Flow --      Pain Score 06/30/19 0806 9     Pain Loc --      Pain Edu? --      Excl. in GC? --    Constitutional: Alert and oriented.  No distress Eyes: Conjunctivae are normal. Normal extraocular movements. ENT      Head: Normocephalic and atraumatic.      Nose: No congestion/rhinnorhea.      Mouth/Throat: Mucous membranes are moist.      Neck: No stridor. Cardiovascular: Normal rate, regular rhythm. No murmurs, rubs, or gallops. Respiratory: Normal respiratory effort without  tachypnea nor retractions. Breath sounds are clear and equal bilaterally. No wheezes/rales/rhonchi. Gastrointestinal: Soft and nontender. Normal bowel sounds Musculoskeletal: Nontender with normal range of motion in extremities. No lower extremity tenderness nor edema. Neurologic:  Normal speech and language. No gross focal neurologic deficits are appreciated.  Skin:  Skin is warm, dry and intact. No rash noted. Psychiatric: Mood and affect are normal. Speech and behavior are normal.  ____________________________________________  ED COURSE:  As part of my medical decision making, I reviewed the  following data within the [facility] History obtained from family if available, nursing notes, old chart and ekg, as well as notes from prior ED visits. Patient presented for a migraine headache, we will assess with labs and imaging as indicated at this time.   Procedures  Urijah L Gaba was evaluated in Emergency Department on 06/30/2019 for the symptoms described in the history of present illness. She was evaluated in the context of the global COVID-19 pandemic, which necessitated consideration that the patient might be at risk for infection with the SARS-CoV-2 virus that causes COVID-19. Institutional protocols and algorithms that pertain to the evaluation of patients at risk for COVID-19 are in a state of rapid change based on information released by regulatory bodies including the CDC and federal and state organizations. These policies and algorithms were followed during the patient's care in the ED.  ___________________________________________   DIFFERENTIAL DIAGNOSIS   Migraine, tension headache, sleep deprivation, dehydration  FINAL ASSESSMENT AND PLAN  Migraine headache   Plan: The patient had presented for persistent migraine headache.  She was given a migraine cocktail while in the ER and feels better.  Upon reviewing her chart she had borderline elevated opening pressure on LP but neurology felt like the headaches were musculoskeletal in origin only.  She is cleared for outpatient follow-up.   Laurence Aly, MD    Note: This note was generated in part or whole with voice recognition software. Voice recognition is usually quite accurate but there are transcription errors that can and very often do occur. I apologize for any typographical errors that were not detected and corrected.     Earleen Newport, MD 06/30/19 1016
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
## 2019-06-30 NOTE — ED Notes (Signed)
Good RX coupon given for RX at discharge

## 2019-06-30 NOTE — ED Triage Notes (Signed)
Pt here with c/o migraine that began on Monday, is supposed to be taking meds for migraines but doesn't have insurance at this time. Unable to pay for Dr visit. Having ringing in her ears, Denies nausea, is sensitive to light. Denies aura. Nad.

## 2019-07-20 ENCOUNTER — Other Ambulatory Visit: Payer: Self-pay

## 2019-07-21 ENCOUNTER — Encounter: Payer: Self-pay | Admitting: Family Medicine

## 2019-08-09 ENCOUNTER — Other Ambulatory Visit: Payer: Self-pay

## 2019-08-09 DIAGNOSIS — Z20822 Contact with and (suspected) exposure to covid-19: Secondary | ICD-10-CM

## 2019-08-12 LAB — NOVEL CORONAVIRUS, NAA: SARS-CoV-2, NAA: NOT DETECTED

## 2019-09-21 ENCOUNTER — Encounter: Payer: Self-pay | Admitting: Certified Nurse Midwife

## 2019-09-21 ENCOUNTER — Ambulatory Visit: Payer: Self-pay | Admitting: Certified Nurse Midwife

## 2019-09-21 ENCOUNTER — Other Ambulatory Visit: Payer: Self-pay

## 2019-09-21 VITALS — BP 117/79 | HR 86 | Ht 62.0 in | Wt 243.4 lb

## 2019-09-21 DIAGNOSIS — Z01419 Encounter for gynecological examination (general) (routine) without abnormal findings: Secondary | ICD-10-CM

## 2019-09-21 DIAGNOSIS — Z13220 Encounter for screening for lipoid disorders: Secondary | ICD-10-CM

## 2019-09-21 DIAGNOSIS — Z136 Encounter for screening for cardiovascular disorders: Secondary | ICD-10-CM
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
## 2019-09-21 NOTE — Patient Instructions (Signed)
Preventive Care 21-33 Years Old, Female Preventive care refers to visits with your health care provider and lifestyle choices that can promote health and wellness. This includes:  A yearly physical exam. This may also be called an annual well check.  Regular dental visits and eye exams.  Immunizations.  Screening for certain conditions.  Healthy lifestyle choices, such as eating a healthy diet, getting regular exercise, not using drugs or products that contain nicotine and tobacco, and limiting alcohol use. What can I expect for my preventive care visit? Physical exam Your health care provider will check your:  Height and weight. This may be used to calculate body mass index (BMI), which tells if you are at a healthy weight.  Heart rate and blood pressure.  Skin for abnormal spots. Counseling Your health care provider may ask you questions about your:  Alcohol, tobacco, and drug use.  Emotional well-being.  Home and relationship well-being.  Sexual activity.  Eating habits.  Work and work environment.  Method of birth control.  Menstrual cycle.  Pregnancy history. What immunizations do I need?  Influenza (flu) vaccine  This is recommended every year. Tetanus, diphtheria, and pertussis (Tdap) vaccine  You may need a Td booster every 10 years. Varicella (chickenpox) vaccine  You may need this if you have not been vaccinated. Human papillomavirus (HPV) vaccine  If recommended by your health care provider, you may need three doses over 6 months. Measles, mumps, and rubella (MMR) vaccine  You may need at least one dose of MMR. You may also need a second dose. Meningococcal conjugate (MenACWY) vaccine  One dose is recommended if you are age 19-21 years and a first-year college student living in a residence hall, or if you have one of several medical conditions. You may also need additional booster doses. Pneumococcal conjugate (PCV13) vaccine  You may need  this if you have certain conditions and were not previously vaccinated. Pneumococcal polysaccharide (PPSV23) vaccine  You may need one or two doses if you smoke cigarettes or if you have certain conditions. Hepatitis A vaccine  You may need this if you have certain conditions or if you travel or work in places where you may be exposed to hepatitis A. Hepatitis B vaccine  You may need this if you have certain conditions or if you travel or work in places where you may be exposed to hepatitis B. Haemophilus influenzae type b (Hib) vaccine  You may need this if you have certain conditions. You may receive vaccines as individual doses or as more than one vaccine together in one shot (combination vaccines). Talk with your health care provider about the risks and benefits of combination vaccines. What tests do I need?  Blood tests  Lipid and cholesterol levels. These may be checked every 5 years starting at age 20.  Hepatitis C test.  Hepatitis B test. Screening  Diabetes screening. This is done by checking your blood sugar (glucose) after you have not eaten for a while (fasting).  Sexually transmitted disease (STD) testing.  BRCA-related cancer screening. This may be done if you have a family history of breast, ovarian, tubal, or peritoneal cancers.  Pelvic exam and Pap test. This may be done every 3 years starting at age 21. Starting at age 30, this may be done every 5 years if you have a Pap test in combination with an HPV test. Talk with your health care provider about your test results, treatment options, and if necessary, the need for more tests.   Follow these instructions at home: Eating and drinking   Eat a diet that includes fresh fruits and vegetables, whole grains, lean protein, and low-fat dairy.  Take vitamin and mineral supplements as recommended by your health care provider.  Do not drink alcohol if: ? Your health care provider tells you not to drink. ? You are  pregnant, may be pregnant, or are planning to become pregnant.  If you drink alcohol: ? Limit how much you have to 0-1 drink a day. ? Be aware of how much alcohol is in your drink. In the [place] one drink equals one 12 oz bottle of beer (355 mL), one 5 oz glass of wine (148 mL), or one 1 oz glass of hard liquor (44 mL). Lifestyle  Take daily care of your teeth and gums.  Stay active. Exercise for at least 30 minutes on 5 or more days each week.  Do not use any products that contain nicotine or tobacco, such as cigarettes, e-cigarettes, and chewing tobacco. If you need help quitting, ask your health care provider.  If you are sexually active, practice safe sex. Use a condom or other form of birth control (contraception) in order to prevent pregnancy and STIs (sexually transmitted infections). If you plan to become pregnant, see your health care provider for a preconception visit. What's next?  Visit your health care provider once a year for a well check visit.  Ask your health care provider how often you should have your eyes and teeth checked.  Stay up to date on all vaccines. This information is not intended to replace advice given to you by your health care provider. Make sure you discuss any questions you have with your health care provider. Document Revised: 04/29/2018 Document Reviewed: 04/29/2018 Elsevier Patient Education  2020 [employer].

## 2019-09-22 LAB — LIPID PANEL
Chol/HDL Ratio: 4.6 ratio — ABNORMAL HIGH (ref 0.0–4.4)
Cholesterol, Total: 189 mg/dL (ref 100–199)
HDL: 41 mg/dL (ref >39)
LDL Chol Calc (NIH): 132 mg/dL — ABNORMAL HIGH (ref 0–99)
Triglycerides: 87 mg/dL (ref 0–149)
VLDL Cholesterol Cal: 16 mg/dL (ref 5–40)

## 2019-12-26 ENCOUNTER — Emergency Department
Admission: EM | Admit: 2019-12-26 | Discharge: 2019-12-26 | Disposition: A | Discharge status: Home / Self Care | Payer: Self-pay | Attending: Emergency Medicine | Admitting: Emergency Medicine

## 2019-12-26 ENCOUNTER — Other Ambulatory Visit: Payer: Self-pay

## 2019-12-26 DIAGNOSIS — Z20822 Contact with and (suspected) exposure to covid-19: Secondary | ICD-10-CM | POA: Insufficient documentation

## 2019-12-26 DIAGNOSIS — R519 Headache, unspecified: Secondary | ICD-10-CM | POA: Insufficient documentation

## 2019-12-26 DIAGNOSIS — Z79899 Other long term (current) drug therapy: Secondary | ICD-10-CM | POA: Insufficient documentation

## 2019-12-26 LAB — POC SARS CORONAVIRUS 2 AG: SARS Coronavirus 2 Ag: NEGATIVE

## 2019-12-26 LAB — RESPIRATORY PANEL BY RT PCR (FLU A&B, COVID)
Influenza A by PCR: NEGATIVE
Influenza B by PCR: NEGATIVE
SARS Coronavirus 2 by RT PCR: NEGATIVE

## 2019-12-26 MED ORDER — DIPHENHYDRAMINE HCL 25 MG PO CAPS
25.0000 mg | ORAL_CAPSULE | Freq: Once | ORAL | Status: AC
  Administered 2019-12-26: 25 mg via ORAL
  Filled 2019-12-26: qty 1

## 2019-12-26 MED ORDER — METOCLOPRAMIDE HCL 10 MG PO TABS
10.0000 mg | ORAL_TABLET | Freq: Once | ORAL | Status: AC
  Administered 2019-12-26: 10 mg via ORAL
  Filled 2019-12-26: qty 1

## 2019-12-26 MED ORDER — ONDANSETRON 4 MG PO TBDP: 4.0000 mg | ORAL_TABLET | Freq: Three times a day (TID) | ORAL | 0 refills | Status: DC | PRN

## 2019-12-26 MED ORDER — KETOROLAC TROMETHAMINE 10 MG PO TABS
10.0000 mg | ORAL_TABLET | Freq: Once | ORAL | Status: AC
  Administered 2019-12-26: 10 mg via ORAL
  Filled 2019-12-26: qty 1

## 2019-12-26 NOTE — Discharge Instructions (Signed)
Your Covid test today was negative. We sent a confirmatory test to the lab.    If you develop fever, cough, or shortness of breath, you should stay home.  However, if you are not having any more symptoms, you can continue your usual activities.

## 2019-12-26 NOTE — ED Provider Notes (Signed)
[facility] Emergency Department Provider Note  ____________________________________________  Time seen: Approximately 2:42 PM  I have reviewed the triage vital signs and the nursing notes.   HISTORY  Chief Complaint Migraine    HPI Alexis Rangel is a 34 y.o. female with a history of migraine headaches, PCOS, bipolar disorder who comes the ED complaining of bilateral frontal migraine, gradual onset, constant for last 3 days, waxing waning, no aggravating or alleviating factors.  No photophobia, no neck pain or stiffness, no fever chills body aches.  She does note that she was in close contact with a coworker who recently became ill and tested positive for COVID-19.  They would eat lunch together frequently.  Denies chest pain shortness of breath or cough.      Past Medical History:  Diagnosis Date  . Bipolar 1 disorder (HCC)   . Depression   . Headache   . PCO (polycystic ovaries)      Patient Active Problem List   Diagnosis Date Noted  . Tinea pedis 04/06/2019  . B12 deficiency 01/17/2019  . Adjustment disorder with mixed disturbance of emotions and conduct 06/11/2017  . Bipolar 2 disorder (HCC) 06/11/2017  . Idiopathic intracranial hypertension 05/25/2017  . Papilledema 05/01/2017  . Pelvic pain in female 05/20/2016  . Hypocalcemia 03/11/2016  . Hyperinsulinemia 03/11/2016  . PCO (polycystic ovaries)   . Family planning 12/18/2015  . Depression 12/18/2015  . Obesity 10/17/2015  . Migraine 10/17/2015  . Cholelithiasis 10/16/2015     Past Surgical History:  Procedure Laterality Date  . ccy  06/09/2014   pt states she does not remember this surgery  . CHOLECYSTECTOMY    . TONSILLECTOMY AND ADENOIDECTOMY    . WISDOM TOOTH EXTRACTION       Prior to Admission medications   Medication Sig Start Date End Date Taking? Authorizing Provider  acetaminophen (TYLENOL) 500 MG tablet Take 500 mg by mouth every 6 (six) hours as needed.     [provider]  cyanocobalamin (,VITAMIN B-12,) 1000 MCG/ML injection Inject 1 mL (1,000 mcg total) into the muscle every 30 (thirty) days. Patient not taking: Reported on 09/21/2019 09/15/18   Shambley, Melody N, CNM  hydrOXYzine (ATARAX/VISTARIL) 25 MG tablet Take 1-2 tablets (25-50 mg total) by mouth 3 (three) times daily as needed. Patient not taking: Reported on 09/21/2019 12/16/18   Olevia Perches P, DO  Levonorgestrel [facility]) 19.5 MG IUD 1 Device by Intrauterine route once. 01/18/16   Shambley, Melody N, CNM  nortriptyline (PAMELOR) 25 MG capsule Take 1 capsule (25 mg total) by mouth at bedtime. 06/30/19   Emily Filbert, MD  ondansetron (ZOFRAN ODT) 4 MG disintegrating tablet Take 1 tablet (4 mg total) by mouth every 8 (eight) hours as needed for nausea or vomiting. 12/26/19   Sharman Cheek, MD  phentermine (ADIPEX-P) 37.5 MG tablet Take 1 tablet (37.5 mg total) by mouth daily before breakfast. Patient not taking: Reported on 09/21/2019 09/15/18   Shambley, Melody N, CNM  QUEtiapine (SEROQUEL) 100 MG tablet Take 1.5 tablets (150 mg total) by mouth at bedtime. 12/16/18   Johnson, Megan P, DO  SUMAtriptan (IMITREX) 100 MG tablet Take 1/2 tablet at onset of migraine. May repeat in 2 hours if headache persists or recurs. 06/30/19   Emily Filbert, MD  Syringe/Needle, Disp, (SYRINGE 3CC/25GX1") 25G X 1" 3 ML MISC 1 each by Does not apply route every 30 (thirty) days. Patient not taking: Reported on 09/21/2019 01/17/19   Olevia Perches  P, DO     Allergies Patient has no known allergies.   Family History  Problem Relation Age of Onset  . Cancer Mother        cervical  . COPD Mother   . Hyperlipidemia Father   . Hypertension Father   . Migraines Father   . Bipolar disorder Sister   . Heart disease Maternal Grandmother   . COPD Maternal Grandmother   . Heart disease Maternal Grandfather   . Hypertension Paternal Grandmother   . Heart disease Paternal Grandmother   .  Hypertension Paternal Grandfather   . Heart disease Paternal Grandfather   . Alzheimer's disease Paternal Grandfather   . Diabetes Neg Hx     Social History Social History   Tobacco Use  . Smoking status: Never Smoker  . Smokeless tobacco: Never Used  Substance Use Topics  . Alcohol use: No  . Drug use: No    Review of Systems  Constitutional:   No fever or chills.  ENT:   No sore throat. No rhinorrhea. Cardiovascular:   No chest pain or syncope. Respiratory:   No dyspnea or cough. Gastrointestinal:   Negative for abdominal pain, vomiting and diarrhea.  Musculoskeletal:   Negative for focal pain or swelling All other systems reviewed and are negative except as documented above in ROS and HPI.  ____________________________________________   PHYSICAL EXAM:  VITAL SIGNS: ED Triage Vitals [12/26/19 1136]  Enc Vitals Group     BP (!) 170/91     Pulse Rate 95     Resp 16     Temp 99.2 F (37.3 C)     Temp Source Oral     SpO2 98 %     Weight 248 lb (112.5 kg)     Height 5\' 2"  (1.575 m)     Head Circumference      Peak Flow      Pain Score 10     Pain Loc      Pain Edu?      Excl. in [place]?     Vital signs reviewed, nursing assessments reviewed.   Constitutional:   Alert and oriented. Non-toxic appearance.  No photophobia, sitting in the treatment room with overhead lights on, looking at her phone. Eyes:   Conjunctivae are normal. EOMI. PERRL. ENT      Head:   Normocephalic and atraumatic.      Nose:   Wearing a mask.      Mouth/Throat:   Wearing a mask.      Neck:   No meningismus. Full ROM. Hematological/Lymphatic/Immunilogical:   No cervical lymphadenopathy. Cardiovascular:   RRR. Symmetric bilateral radial and DP pulses.  No murmurs. Cap refill less than 2 seconds. Respiratory:   Normal respiratory effort without tachypnea/retractions. Breath sounds are clear and equal bilaterally. No wheezes/rales/rhonchi. Gastrointestinal:   Soft and nontender. Non  distended. There is no CVA tenderness.  No rebound, rigidity, or guarding. Musculoskeletal:   Normal range of motion in all extremities. No joint effusions.  No lower extremity tenderness.  No edema. Neurologic:   Normal speech and language.  Motor grossly intact. No acute focal neurologic deficits are appreciated.  Skin:    Skin is warm, dry and intact. No rash noted.  No petechiae, purpura, or bullae.  ____________________________________________    LABS (pertinent positives/negatives) (all labs ordered are listed, but only abnormal results are displayed) Labs Reviewed  RESPIRATORY PANEL BY RT PCR (FLU A&B, COVID)  POC SARS CORONAVIRUS 2 AG -  ED  POC SARS CORONAVIRUS 2 AG   ____________________________________________   EKG    ____________________________________________    RADIOLOGY  No results found.  ____________________________________________   PROCEDURES Procedures  ____________________________________________    CLINICAL IMPRESSION / ASSESSMENT AND PLAN / ED COURSE  Medications ordered in the ED: Medications  ketorolac (TORADOL) tablet 10 mg (10 mg Oral Given 12/26/19 1355)  metoCLOPramide (REGLAN) tablet 10 mg (10 mg Oral Given 12/26/19 1355)  diphenhydrAMINE (BENADRYL) capsule 25 mg (25 mg Oral Given 12/26/19 1355)    Pertinent labs & imaging results that were available during my care of the patient were reviewed by me and considered in my medical decision making (see chart for details).  Alexis Rangel was evaluated in Emergency Department on 12/26/2019 for the symptoms described in the history of present illness. She was evaluated in the context of the global COVID-19 pandemic, which necessitated consideration that the patient might be at risk for infection with the SARS-CoV-2 virus that causes COVID-19. Institutional protocols and algorithms that pertain to the evaluation of patients at risk for COVID-19 are in a state of rapid change based on  information released by regulatory bodies including the CDC and federal and state organizations. These policies and algorithms were followed during the patient's care in the ED.   Patient presents with frontal headache with recent Covid exposure.  Vital signs are normal, exam is benign and reassuring.  Neurologically intact.  Possibly a mild migraine versus constitutional symptoms of Covid.  Point-of-care Covid testing negative.  We will send a PCR lab test, stable for discharge home.  Doubt meningitis or encephalitis, intracranial hypertension, glaucoma, stroke or ICH      ____________________________________________   FINAL CLINICAL IMPRESSION(S) / ED DIAGNOSES    Final diagnoses:  Frontal headache  Contact with and (suspected) exposure to covid-19     ED Discharge Orders         Ordered    ondansetron (ZOFRAN ODT) 4 MG disintegrating tablet  Every 8 hours PRN     12/26/19 1440          Portions of this note were generated with dragon dictation software. Dictation errors may occur despite best attempts at proofreading.   Sharman Cheek, MD 12/26/19 [phone]

## 2019-12-26 NOTE — ED Triage Notes (Signed)
Pt arrives to ED c/o of migraine since Saturday. Hx of migraines. States "I haven't taken any meds for them" A&O, ambulatory. Denies NV.

## 2019-12-26 NOTE — ED Triage Notes (Signed)
FIRST NURSE NOTE- here for migraine.  Hx of same, feels like normal migraines.  Ambulatory. NAD

## 2020-03-09 ENCOUNTER — Encounter: Payer: Self-pay | Admitting: Family Medicine

## 2020-03-09 ENCOUNTER — Telehealth (INDEPENDENT_AMBULATORY_CARE_PROVIDER_SITE_OTHER): Payer: Self-pay | Admitting: Family Medicine

## 2020-03-09 VITALS — Wt 240.0 lb

## 2020-03-09 DIAGNOSIS — G43109 Migraine with aura, not intractable, without status migrainosus: Secondary | ICD-10-CM

## 2020-03-09 MED ORDER — NORTRIPTYLINE HCL 50 MG PO CAPS: 50.0000 mg | ORAL_CAPSULE | Freq: Every day | ORAL | 0 refills | Status: DC

## 2020-03-09 MED ORDER — SUMATRIPTAN SUCCINATE 100 MG PO TABS: ORAL_TABLET | ORAL | 1 refills | Status: DC

## 2020-03-09 NOTE — Progress Notes (Signed)
Wt 240 lb (108.9 kg)   BMI 43.90 kg/m    Subjective:    Patient ID: Alexis Rangel, female    DOB: 10/22/1985, 34 y.o.   MRN: [id number]  HPI: Alexis Rangel is a 34 y.o. female  Chief Complaint  Patient presents with  . Migraine    . This visit was completed via MyChart due to the restrictions of the COVID-19 pandemic. All issues as above were discussed and addressed. Physical exam was done as above through visual confirmation on MyChart. If it was felt that the patient should be evaluated in the office, they were directed there. The patient verbally consented to this visit. . Location of the patient: parked car . Location of the provider: work . Those involved with this call:  . Provider: Roosvelt Maser, PA-C . CMA: Elton Sin, CMA . Front Desk/Registration: Harriet Pho  . Time spent on call: 15 minutes with patient face to face via video conference. More than 50% of this time was spent in counseling and coordination of care. 5 minutes total spent in review of patient's record and preparation of their chart. I verified patient identity using two factors (patient name and date of birth). Patient consents verbally to being seen via telemedicine visit today.   Mychart visit had to be converted to a phone call after about 1 minute due to poor connection.   Has been having a migraine for about 2 days now. Taking 800 mg ibuprofen with some mild temporary relief. Notes the past month or so has been having frequent migraine episodes despite taking nortriptyline at bedtime. Currently out of maxalt, notes this does help when she can take it. Hx of idiopathic intracranial hypertension, has not followed up with Neurology in quite some time due to lack of insurance.   Relevant past medical, surgical, family and social history reviewed and updated as indicated. Interim medical history since our last visit reviewed. Allergies and medications reviewed and updated.  Review of  Systems  Per HPI unless specifically indicated above     Objective:    Wt 240 lb (108.9 kg)   BMI 43.90 kg/m   Wt Readings from Last 3 Encounters:  03/09/20 240 lb (108.9 kg)  12/26/19 248 lb (112.5 kg)  09/21/19 243 lb 6 oz (110.4 kg)    Physical Exam Vitals and nursing note reviewed.  Constitutional:      General: She is not in acute distress.    Appearance: Normal appearance.  HENT:     Head: Atraumatic.     Right Ear: External ear normal.     Left Ear: External ear normal.     Nose: Nose normal. No congestion.     Mouth/Throat:     Mouth: Mucous membranes are moist.     Pharynx: Oropharynx is clear. No posterior oropharyngeal erythema.  Eyes:     Extraocular Movements: Extraocular movements intact.     Conjunctiva/sclera: Conjunctivae normal.  Cardiovascular:     Comments: Unable to assess via virtual visit Pulmonary:     Effort: Pulmonary effort is normal. No respiratory distress.  Musculoskeletal:        General: Normal range of motion.     Cervical back: Normal range of motion.  Skin:    General: Skin is dry.     Findings: No erythema.  Neurological:     Mental Status: She is alert and oriented to person, place, and time.  Psychiatric:        Mood and  Affect: Mood normal.        Thought Content: Thought content normal.        Judgment: Judgment normal.     Results for orders placed or performed during the hospital encounter of 12/26/19  Respiratory Panel by RT PCR (Flu A&B, Covid) - Nasopharyngeal Swab   Specimen: Nasopharyngeal Swab  Result Value Ref Range   SARS Coronavirus 2 by RT PCR NEGATIVE NEGATIVE   Influenza A by PCR NEGATIVE NEGATIVE   Influenza B by PCR NEGATIVE NEGATIVE  POC SARS Coronavirus 2 Ag  Result Value Ref Range   SARS Coronavirus 2 Ag NEGATIVE NEGATIVE      Assessment & Plan:   Problem List Items Addressed This Visit      Cardiovascular and Mediastinum   Migraine - Primary    Increase nortriptyline to 50 mg, restart  maxalt and continue ibuprofen prn. Follow up with Neurology if sxs worsening or not improving      Relevant Medications   nortriptyline (PAMELOR) 50 MG capsule   SUMAtriptan (IMITREX) 100 MG tablet       Follow up plan: Return for CPE.

## 2020-03-11 NOTE — Assessment & Plan Note (Signed)
Increase nortriptyline to 50 mg, restart maxalt and continue ibuprofen prn. Follow up with Neurology if sxs worsening or not improving

## 2020-03-16 ENCOUNTER — Encounter: Payer: Self-pay | Admitting: Family Medicine

## 2020-03-16 ENCOUNTER — Ambulatory Visit (INDEPENDENT_AMBULATORY_CARE_PROVIDER_SITE_OTHER): Payer: Self-pay | Admitting: Family Medicine

## 2020-03-16 ENCOUNTER — Other Ambulatory Visit: Payer: Self-pay

## 2020-03-16 VITALS — BP 123/85 | HR 106 | Temp 98.8°F | Wt 242.0 lb

## 2020-03-16 DIAGNOSIS — G932 Benign intracranial hypertension: Secondary | ICD-10-CM

## 2020-03-16 NOTE — Progress Notes (Signed)
BP 123/85   Pulse (!) 106   Temp 98.8 F (37.1 C) (Oral)   Wt 242 lb (109.8 kg)   SpO2 99%   BMI 44.26 kg/m    Subjective:    Patient ID: Alexis Rangel, female    DOB: 01-24-1986, 34 y.o.   MRN: [id number]  HPI: Alexis Rangel is a 34 y.o. female  Chief Complaint  Patient presents with  . Migraine    ER f/u this past Monday   ER FOLLOW UP- went to ER with headache x 5 days duration.  Time since discharge: 4 days Hospital/facility: [facility] Diagnosis: Pseudotumor cerebrii Procedures/tests: MRI brain, labs MRI brain showed mild increased fluid is noted particularly the optic nerve sheaths and enlarged, partially empty sella turcica, c/w idiopathic intracranial HTN.   Lumbar puncture failed x3 Consultants: neurology New medications: Diamox Discharge instructions:  Follow up with neurology and ophthalmology, needs outpatient LP with fluoroscopy Status: stable  She is feeling a little better since starting the diamox. Started it yesterday. No visual changes. No fevers. No chills. She is otherwise feeling well with no other concerns or complaints at this time.   Relevant past medical, surgical, family and social history reviewed and updated as indicated. Interim medical history since our last visit reviewed. Allergies and medications reviewed and updated.  Review of Systems  Constitutional: Negative.   HENT: Negative.   Eyes: Negative.   Respiratory: Negative.   Cardiovascular: Negative.   Gastrointestinal: Negative.   Psychiatric/Behavioral: Negative.     Per HPI unless specifically indicated above     Objective:    BP 123/85   Pulse (!) 106   Temp 98.8 F (37.1 C) (Oral)   Wt 242 lb (109.8 kg)   SpO2 99%   BMI 44.26 kg/m   Wt Readings from Last 3 Encounters:  03/16/20 242 lb (109.8 kg)  03/09/20 240 lb (108.9 kg)  12/26/19 248 lb (112.5 kg)    Physical Exam Vitals and nursing note reviewed.  Constitutional:      General: She is not  in acute distress.    Appearance: Normal appearance. She is not ill-appearing, toxic-appearing or diaphoretic.  HENT:     Head: Normocephalic and atraumatic.     Right Ear: External ear normal.     Left Ear: External ear normal.     Nose: Nose normal.     Mouth/Throat:     Mouth: Mucous membranes are moist.     Pharynx: Oropharynx is clear.  Eyes:     General: No scleral icterus.       Right eye: No discharge.        Left eye: No discharge.     Extraocular Movements: Extraocular movements intact.     Conjunctiva/sclera: Conjunctivae normal.     Pupils: Pupils are equal, round, and reactive to light.  Cardiovascular:     Rate and Rhythm: Normal rate and regular rhythm.     Pulses: Normal pulses.     Heart sounds: Normal heart sounds. No murmur heard.  No friction rub. No gallop.   Pulmonary:     Effort: Pulmonary effort is normal. No respiratory distress.     Breath sounds: Normal breath sounds. No stridor. No wheezing, rhonchi or rales.  Chest:     Chest wall: No tenderness.  Musculoskeletal:        General: Normal range of motion.     Cervical back: Normal range of motion and neck supple.  Skin:  General: Skin is warm and dry.     Capillary Refill: Capillary refill takes less than 2 seconds.     Coloration: Skin is not jaundiced or pale.     Findings: No bruising, erythema, lesion or rash.  Neurological:     General: No focal deficit present.     Mental Status: She is alert and oriented to person, place, and time. Mental status is at baseline.  Psychiatric:        Mood and Affect: Mood normal.        Behavior: Behavior normal.        Thought Content: Thought content normal.        Judgment: Judgment normal.     Results for orders placed or performed during the hospital encounter of 12/26/19  Respiratory Panel by RT PCR (Flu A&B, Covid) - Nasopharyngeal Swab   Specimen: Nasopharyngeal Swab  Result Value Ref Range   SARS Coronavirus 2 by RT PCR NEGATIVE NEGATIVE    Influenza A by PCR NEGATIVE NEGATIVE   Influenza B by PCR NEGATIVE NEGATIVE  POC SARS Coronavirus 2 Ag  Result Value Ref Range   SARS Coronavirus 2 Ag NEGATIVE NEGATIVE      Assessment & Plan:   Problem List Items Addressed This Visit      Nervous and Auditory   Idiopathic intracranial hypertension    Has been off medicine. Just restarted her diamox yesterday and feeling better. Needs to get into see ophthalmology and neurology. Referrals generated today. Needs help filling out [facility] charity care paperwork. Will refer to CCM to see if they can help.       Pseudotumor cerebri - Primary    Has been off medicine. Just restarted her diamox yesterday and feeling better. Needs to get into see ophthalmology and neurology. Referrals generated today. Needs help filling out [facility] charity care paperwork. Will refer to CCM to see if they can help.       Relevant Orders   Ambulatory referral to Ophthalmology   Ambulatory referral to Neurology   Referral to Chronic Care Management Services       Follow up plan: Return in about 6 months (around 09/16/2020).

## 2020-03-18 ENCOUNTER — Encounter: Payer: Self-pay | Admitting: Family Medicine

## 2020-03-18 DIAGNOSIS — G932 Benign intracranial hypertension: Secondary | ICD-10-CM | POA: Insufficient documentation

## 2020-03-18 NOTE — Assessment & Plan Note (Signed)
Has been off medicine. Just restarted her diamox yesterday and feeling better. Needs to get into see ophthalmology and neurology. Referrals generated today. Needs help filling out [facility] charity care paperwork. Will refer to CCM to see if they can help.

## 2020-03-18 NOTE — Assessment & Plan Note (Signed)
Has been off medicine. Just restarted her diamox yesterday and feeling better. Needs to get into see ophthalmology and neurology. Referrals generated today. Needs help filling out [facility] charity care paperwork. Will refer to CCM to see if they can help.  

## 2020-03-26 ENCOUNTER — Telehealth: Payer: Self-pay | Admitting: Family Medicine

## 2020-03-26 NOTE — Telephone Encounter (Signed)
Copied from CRM [id number]. Topic: Quick Communication - Rx Refill/Question >> Mar 26, 2020 10:54 AM Randol Kern wrote: Pt is having a response to a medication prescribed at the ED. Lips tingle, hands/feet tingle. acetaZOLAMIDE (DIAMOX) 500 MG capsule  Best contact: [phone]  Either PCP or Roosvelt Maser request

## 2020-03-26 NOTE — Telephone Encounter (Signed)
Routing as FYI

## 2020-03-26 NOTE — Telephone Encounter (Signed)
That can be a side effect. I would advise her to continue it until she sees neurology in 2 weeks

## 2020-03-26 NOTE — Telephone Encounter (Signed)
Patient called to check if Doctor Laural Benes receved her message regarding the medication. Advised the patient of Dr. Henriette Combs message. Patient expressed understanding.

## 2020-03-26 NOTE — Telephone Encounter (Signed)
Routing to provider  

## 2020-04-30 ENCOUNTER — Ambulatory Visit: Payer: Self-pay

## 2020-04-30 NOTE — Telephone Encounter (Signed)
04/28/20 woke with severe headache (pt has h/o migraines), dizzy spells and ears are ringing. Pt stated when she is dizzy, her vision become blurred. Pt had LP on 04/27/20. Pt stated her migraine comes and goes. Pt has been taking 500 mg every 4-6 hours. Last dose was at noon today. Pt does get some relief but stated that headache never completely goes away.  Pt stated her pain 9/10 and stated that it goes from her eyes all the way back to her neck. Pt denied stiff neck after asking her to touch her chin to her chest. Pt stated the dizziness is new and stated she never had it with her other migraines. Pt stated that she has to hold onto objects to maintain balance. Pt stated she tries to get seated as soon as she feels off balance. Denies head injury, fever, sore throat or cold sx.  Pt stated her last migraine was last Tuesday and was alleviated with Tylenol. 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  Pt given care advice and will go to ED at [facility]. Pt verbalized understanding. Pt stated she attempted to contact her neurologist but so far has not received a return call.   Reason for Disposition . [1] SEVERE headache (e.g., excruciating) AND [2] "worst headache" of life  Answer Assessment - Initial Assessment Questions 1. LOCATION: "Where does it hurt?"      Eyes to the back of neck 2. ONSET: "When did the headache start?" (Minutes, hours or days)      Saturday pt woke up with it 3. PATTERN: "Does the pain come and go, or has it been constant since it started?"     Comes and goes 500 mg acetaminophen- every 4-5 hours never completely goes away LD 1200 4. SEVERITY: "How bad is the pain?" and "What does it keep you from doing?"  (e.g., Scale 1-10; mild, moderate, or severe)   - MILD (1-3): doesn't interfere with normal activities    - MODERATE (4-7): interferes with normal activities or awakens from sleep    - SEVERE (8-10): excruciating pain, unable to do  any normal activities        severe 5. RECURRENT SYMPTOM: "Have you ever had headaches before?" If Yes, ask: "When was the last time?" and "What happened that time?"     Yes-last Tuesday- tx with and completely went away 6. CAUSE: "What do you think is causing the headache?"     Had LP on 04/27/20- feels like sitting on head "pressure" 7. MIGRAINE: "Have you been diagnosed with migraine headaches?" If Yes, ask: "Is this headache similar?"      Yes- similar 8. HEAD INJURY: "Has there been any recent injury to the head?"      no 9. OTHER SYMPTOMS: "Do you have any other symptoms?" (fever, stiff neck, eye pain, sore throat, cold symptoms)    Mild dizziness that comes and goes that she is having to hold on to objects to maintain balance tries to sit down as soon as she can  10. PREGNANCY: "Is there any chance you are pregnant?" "When was your last menstrual period?"       No-has IUD and spots occasionally  Protocols used: HEADACHE-A-AH

## 2020-05-17 ENCOUNTER — Telehealth: Payer: Self-pay

## 2020-05-17 NOTE — Telephone Encounter (Signed)
    MA9/16/2021 1st Attempt  Name: Alexis Rangel   MRN: [id number]   DOB: Oct 16, 1985   AGE: 34 y.o.   GENDER: female   PCP Dorcas Carrow, DO.   05/17/20 Spoke with [name] she has a contact person to call at [facility] to assist her with the charity paperwork. She stated she has not had time to call them but would. I asked if there was anything else I could assist her with but she said she is fine.  No other resources needed at this time. Closing referral.    Christoper Bushey, AAS Paralegal, [facility] Care Guide . Embedded Care Coordination [facility]  Care Management  [address] [place], [place] [address] [email or web address]  [phone]   [email or web address]

## 2020-07-10 ENCOUNTER — Encounter: Payer: Self-pay | Admitting: Family Medicine

## 2020-07-10 ENCOUNTER — Telehealth (INDEPENDENT_AMBULATORY_CARE_PROVIDER_SITE_OTHER): Payer: Self-pay | Admitting: Family Medicine

## 2020-07-10 DIAGNOSIS — F331 Major depressive disorder, recurrent, moderate: Secondary | ICD-10-CM

## 2020-07-10 DIAGNOSIS — F3181 Bipolar II disorder: Secondary | ICD-10-CM

## 2020-07-10 DIAGNOSIS — G43109 Migraine with aura, not intractable, without status migrainosus: Secondary | ICD-10-CM

## 2020-07-10 MED ORDER — SUMATRIPTAN SUCCINATE 100 MG PO TABS: ORAL_TABLET | ORAL | 12 refills | Status: DC

## 2020-07-10 MED ORDER — IBUPROFEN 800 MG PO TABS: 800.0000 mg | ORAL_TABLET | Freq: Three times a day (TID) | ORAL | 1 refills | Status: DC | PRN

## 2020-07-10 MED ORDER — HYDROXYZINE HCL 25 MG PO TABS: 25.0000 mg | ORAL_TABLET | Freq: Three times a day (TID) | ORAL | 6 refills | Status: DC | PRN

## 2020-07-10 MED ORDER — ONDANSETRON 4 MG PO TBDP: 4.0000 mg | ORAL_TABLET | Freq: Three times a day (TID) | ORAL | 6 refills | Status: DC | PRN

## 2020-07-10 MED ORDER — QUETIAPINE FUMARATE ER 150 MG PO TB24: 150.0000 mg | ORAL_TABLET | Freq: Every day | ORAL | 1 refills | Status: DC

## 2020-07-10 MED ORDER — NORTRIPTYLINE HCL 50 MG PO CAPS: 50.0000 mg | ORAL_CAPSULE | Freq: Every day | ORAL | 1 refills | Status: DC

## 2020-07-10 NOTE — Progress Notes (Signed)
There were no vitals taken for this visit.   Subjective:    Patient ID: Alexis Rangel, female    DOB: [birth year], 34 y.o.   MRN: [id number]  HPI: Alexis Rangel is a 34 y.o. female  Chief Complaint  Patient presents with  . Migraine   Has been still having her migraines like she was before her LP. She did have the elevated pressure again. She is having a headache about 2-3x a week, some are not that bad, and some are really bad. She has been pushing through to go to work, but not feeling well. Her neurologist got her started back on topamax, but she has terrible paresthesias with it and cannot tolerate it. MIGRAINES Duration: chronic Onset: sudden Severity: severe Quality: throbbing, sharp Frequency: 2-3x a week Location: the back of her neck and into her eyes Headache duration: 1-3 days Radiation: no Time of day headache occurs: at random Alleviating factors: 800mg  ibuprofen Aggravating factors: nothing Headache status at time of visit: current headache Treatments attempted: Treatments attempted: none, rest, ice, heat, APAP, ibuprofen, aleve", excedrine, triptans, topamax and amitriptyline   Aura: yes Nausea:  yes Vomiting: no Photophobia:  yes Phonophobia:  yes Effect on social functioning:  yes Confusion:  no Gait disturbance/ataxia:  no Behavioral changes:  no Fevers:  no  ANXIETY/STRESS Duration:exacerbated Anxious mood: yes  Excessive worrying: yes Irritability: yes  Sweating: no Nausea: no Palpitations:no Hyperventilation: no Panic attacks: no Agoraphobia: no  Obscessions/compulsions: no Depressed mood: no Depression screen [facility] 2/9 03/09/2020 01/17/2019 12/16/2018 11/15/2018 07/16/2017  Decreased Interest 0 0 0 2 2  Down, Depressed, Hopeless 0 1 2 3 3   PHQ - 2 Score 0 1 2 5 5   Altered sleeping 2 1 1 2 1   Tired, decreased energy 2 1 0 2 2  Change in appetite 0 1 0 1 3  Feeling bad or failure about yourself  0 1 0 3 3  Trouble concentrating 0 0  0 1 1  Moving slowly or fidgety/restless 0 1 0 1 2  Suicidal thoughts 0 0 0 0 2  PHQ-9 Score 4 6 3 15 19   Difficult doing work/chores - Not difficult at all Not difficult at all - Somewhat difficult   Anhedonia: no Weight changes: no Insomnia: no   Hypersomnia: no Fatigue/loss of energy: yes Feelings of worthlessness: no Feelings of guilt: no Impaired concentration/indecisiveness: no Suicidal ideations: no  Crying spells: no Recent Stressors/Life Changes: yes   Relationship problems: no   Family stress: yes     Financial stress: no    Job stress: no    Recent death/loss: no   Relevant past medical, surgical, family and social history reviewed and updated as indicated. Interim medical history since our last visit reviewed. Allergies and medications reviewed and updated.  Review of Systems  Per HPI unless specifically indicated above     Objective:    There were no vitals taken for this visit.  Wt Readings from Last 3 Encounters:  03/16/20 242 lb (109.8 kg)  03/09/20 240 lb (108.9 kg)  12/26/19 248 lb (112.5 kg)    Physical Exam Vitals and nursing note reviewed.  Constitutional:      General: She is not in acute distress.    Appearance: Normal appearance. She is not ill-appearing, toxic-appearing or diaphoretic.  HENT:     Head: Normocephalic and atraumatic.     Right Ear: External ear normal.     Left Ear: External ear normal.  Nose: Nose normal.     Mouth/Throat:     Mouth: Mucous membranes are moist.     Pharynx: Oropharynx is clear.  Eyes:     General: No scleral icterus.       Right eye: No discharge.        Left eye: No discharge.     Conjunctiva/sclera: Conjunctivae normal.     Pupils: Pupils are equal, round, and reactive to light.  Pulmonary:     Effort: Pulmonary effort is normal. No respiratory distress.     Comments: Speaking in full sentences Musculoskeletal:        General: Normal range of motion.     Cervical back: Normal range of  motion.  Skin:    Coloration: Skin is not jaundiced or pale.     Findings: No bruising, erythema, lesion or rash.  Neurological:     Mental Status: She is alert and oriented to person, place, and time. Mental status is at baseline.  Psychiatric:        Mood and Affect: Mood normal.        Behavior: Behavior normal.        Thought Content: Thought content normal.        Judgment: Judgment normal.     Results for orders placed or performed during the hospital encounter of 12/26/19  Respiratory Panel by RT PCR (Flu A&B, Covid) - Nasopharyngeal Swab   Specimen: Nasopharyngeal Swab  Result Value Ref Range   SARS Coronavirus 2 by RT PCR NEGATIVE NEGATIVE   Influenza A by PCR NEGATIVE NEGATIVE   Influenza B by PCR NEGATIVE NEGATIVE  POC SARS Coronavirus 2 Ag  Result Value Ref Range   SARS Coronavirus 2 Ag NEGATIVE NEGATIVE      Assessment & Plan:   Problem List Items Addressed This Visit      Cardiovascular and Mediastinum   Migraine - Primary    Not doing well. Cannot tolerate topamax due to paresthesias. Already on amitriptyline. Has failed naproxen, imitrex and ibuprofen. Seeing neurology. Will get [facility] pharmacy involved to see about getting her on a monthly injectable to help with her migraines. Once we know what she can get, will start one until she gets back into see neurology. Refills on her imitrex and her ibuprofen given today. Use ibuprofen very sparingly.      Relevant Medications   nortriptyline (PAMELOR) 50 MG capsule   SUMAtriptan (IMITREX) 100 MG tablet   ibuprofen (ADVIL) 800 MG tablet   Other Relevant Orders   Referral to Chronic Care Management Services     Other   Bipolar 2 disorder (HCC)    Not under good control. Will change her seroquel to XR and recheck 1-2 months.       RESOLVED: Depression    Not under good control. Will change her seroquel to XR and recheck 1-2 months.       Relevant Medications   nortriptyline (PAMELOR) 50 MG capsule    hydrOXYzine (ATARAX/VISTARIL) 25 MG tablet       Follow up plan: Return for 1-2 months, after starting injectable.   . This visit was completed via MyChart due to the restrictions of the COVID-19 pandemic. All issues as above were discussed and addressed. Physical exam was done as above through visual confirmation on MyChart. If it was felt that the patient should be evaluated in the office, they were directed there. The patient verbally consented to this visit. . Location of the patient: home . Location  of the provider: work . Those involved with this call:  . Provider: Olevia Perches, DO . CMA: Rolley Sims, CMA . Front Desk/Registration: Harriet Pho  . Time spent on call: 25 minutes with patient face to face via video conference. More than 50% of this time was spent in counseling and coordination of care. 40 minutes total spent in review of patient's record and preparation of their chart.

## 2020-07-10 NOTE — Assessment & Plan Note (Signed)
Not under good control. Will change her seroquel to XR and recheck 1-2 months.

## 2020-07-10 NOTE — Assessment & Plan Note (Signed)
Not under good control. Will change her seroquel to XR and recheck 1-2 months.  

## 2020-07-10 NOTE — Assessment & Plan Note (Signed)
Not doing well. Cannot tolerate topamax due to paresthesias. Already on amitriptyline. Has failed naproxen, imitrex and ibuprofen. Seeing neurology. Will get [facility] pharmacy involved to see about getting her on a monthly injectable to help with her migraines. Once we know what she can get, will start one until she gets back into see neurology. Refills on her imitrex and her ibuprofen given today. Use ibuprofen very sparingly.

## 2020-07-11 ENCOUNTER — Telehealth: Payer: Self-pay

## 2020-07-11 NOTE — Chronic Care Management (AMB) (Signed)
  Care Management   Note  07/11/2020 Name: Alexis Rangel MRN: [id number] DOB: Mar 04, 1986  Alexis Rangel is a 34 y.o. year old female who is a primary care patient of Dorcas Carrow, DO. I reached out to [name] by phone today in response to a referral sent by Alexis Rangel's Dr. Laural Benes.    Alexis Rangel was given information about care management services today including:  1. Care management services include personalized support from designated clinical staff supervised by her physician, including individualized plan of care and coordination with other care providers 2. 24/7 contact phone numbers for assistance for urgent and routine care needs. 3. The patient may stop care management services at any time by phone call to the office staff.  Patient agreed to services and verbal consent obtained.   Follow up plan: Telephone appointment with care management team member scheduled for:07/25/2020  Penne Lash, RMA Care Guide, Embedded Care Coordination [facility]  [place], [place] [address] Direct Dial: [phone] [email or web address]: [email or web address]

## 2020-07-17 ENCOUNTER — Telehealth: Payer: Self-pay

## 2020-07-17 NOTE — Telephone Encounter (Signed)
-----   Message from Dorcas Carrow, DO sent at 07/10/2020  3:42 PM EST ----- 1-2 months, after starting injectable

## 2020-07-17 NOTE — Telephone Encounter (Signed)
Pt stated she would call to make apt as she is not yet taking the injectables.

## 2020-07-24 ENCOUNTER — Telehealth: Payer: Self-pay | Admitting: Pharmacist

## 2020-07-24 NOTE — Chronic Care Management (AMB) (Addendum)
Chronic Care Management Pharmacy Assistant   Name: Alexis Rangel  MRN: [id number] DOB: 05/13/1986  Reason for Encounter: General Adherence Assessment  Patient Questions:  1.  Have you seen any other providers since your last visit? No  2.  Any changes in your medicines or health? No   PCP : Olevia Perches P, DO  Allergies:   Allergies  Allergen Reactions   Topamax [Topiramate] Other (See Comments)    Paresthesias     Medications: Outpatient Encounter Medications as of 07/24/2020  Medication Sig   acetaminophen (TYLENOL) 500 MG tablet Take 500 mg by mouth every 6 (six) hours as needed.   cyanocobalamin (,VITAMIN B-12,) 1000 MCG/ML injection Inject 1 mL (1,000 mcg total) into the muscle every 30 (thirty) days. (Patient not taking: Reported on 07/10/2020)   hydrOXYzine (ATARAX/VISTARIL) 25 MG tablet Take 1-2 tablets (25-50 mg total) by mouth 3 (three) times daily as needed.   ibuprofen (ADVIL) 800 MG tablet Take 1 tablet (800 mg total) by mouth every 8 (eight) hours as needed.   Levonorgestrel (KYLEENA) 19.5 MG IUD 1 Device by Intrauterine route once.   nortriptyline (PAMELOR) 50 MG capsule Take 1 capsule (50 mg total) by mouth at bedtime.   ondansetron (ZOFRAN ODT) 4 MG disintegrating tablet Take 1 tablet (4 mg total) by mouth every 8 (eight) hours as needed for nausea or vomiting.   phentermine (ADIPEX-P) 37.5 MG tablet Take 1 tablet (37.5 mg total) by mouth daily before breakfast. (Patient not taking: Reported on 07/10/2020)   QUEtiapine Fumarate (SEROQUEL XR) 150 MG 24 hr tablet Take 1 tablet (150 mg total) by mouth at bedtime.   SUMAtriptan (IMITREX) 100 MG tablet Take 1/2 tablet at onset of migraine. May repeat in 2 hours if headache persists or recurs.   Syringe/Needle, Disp, (SYRINGE 3CC/25GX1") 25G X 1" 3 ML MISC 1 each by Does not apply route every 30 (thirty) days.   No facility-administered encounter medications on file as of 07/24/2020.    Current  Diagnosis: Patient Active Problem List   Diagnosis Date Noted   Pseudotumor cerebri 03/18/2020   B12 deficiency 01/17/2019   Bipolar 2 disorder (HCC) 06/11/2017   Idiopathic intracranial hypertension 05/25/2017   Papilledema 05/01/2017   Pelvic pain in female 05/20/2016   Hypocalcemia 03/11/2016   Hyperinsulinemia 03/11/2016   PCO (polycystic ovaries)    Obesity 10/17/2015   Migraine 10/17/2015   Cholelithiasis 10/16/2015   Have you seen any other providers since your last visit? No  Any changes in your medications or health? No  Any side effects from any medications? No  Do you have an symptoms or problems not managed by your medications? No  Any concerns about your health right now? No  Has your provider asked that you check blood pressure, blood sugar, or follow special diet at home? No  Do you get any type of exercise on a regular basis? No  Can you think of a goal you would like to reach for your health? Patient states not offhand, but she would like to lose weight.  Do you have any problems getting your medications? No  Is there anything that you would like to discuss during the appointment? No.  Please bring medications and supplements to appointment    07/24/20-CPA called and spoke with the patient to follow-up with her regarding her history of migraines. Per Televisit 07/10/20-patient not doing well; has failed naproxen, imitrex, and ibuprofen for her migraines. Per patient; Dr. Laural Benes has advised the  patient to sit and speak with CPP Boykin Nearing to see about getting a monthly injectable to help with her migraines. The patient stated she does not know the type of injection Dr.Johnson requested for her. Per patient; Dr. Laural Benes communicated with her that there are 3 different types of injections to ease her migraines . CPA made the patient aware she is scheduled to speak with CPP Boykin Nearing on 08/13/20. The patient verbalized understanding. The patient voiced she  is also scheduled to see a Lumbar Specialist in [place] on 08/07/20 for a consult to help her migraines. The patient states she has no other concerns or needs at this time. CPA communicated with the patient will notify Boykin Nearing, CPP and follow up on next step for patient assistance. The patient verbalized understanding.    Suezanne Cheshire, CMA Clinical Pharmicist Assistant [phone]   Follow-Up:  CPA to follow-up with the patient after 08/07/20 appointment with specialist.  I have reviewed the care management and care coordination activities outlined in this encounter and I am certifying that I agree with the content of this note.  Mercer Pod. Tiburcio Pea PharmD, BCPS Clinical Pharmacist [facility] [phone]

## 2020-07-25 ENCOUNTER — Ambulatory Visit: Payer: Self-pay

## 2020-08-10 ENCOUNTER — Other Ambulatory Visit: Payer: Self-pay | Admitting: Family Medicine

## 2020-08-10 ENCOUNTER — Telehealth: Payer: Self-pay | Admitting: Family Medicine

## 2020-08-10 ENCOUNTER — Telehealth: Payer: Self-pay | Admitting: Pharmacist

## 2020-08-10 MED ORDER — AIMOVIG 70 MG/ML ~~LOC~~ SOAJ: 70.0000 mg | SUBCUTANEOUS | 3 refills | Status: DC

## 2020-08-10 NOTE — Progress Notes (Addendum)
Chronic Care Management Pharmacy Assistant   Name: Alexis Rangel  MRN: [id number] DOB: [birth year]  Reason for Encounter: Initial questions and appointment reminder with clinical pharmacist on 08-13-20 at 9:00 am.   Patient Questions:  1.  Have you seen any other providers since your last visit? Yes, video visit on 07-10-20 with primary care provider Dr. Laural Benes.   2.  Any changes in your medicines or health? No    PCP : Olevia Perches P, DO  Allergies:   Allergies  Allergen Reactions   Topamax [Topiramate] Other (See Comments)    Paresthesias     Medications: Outpatient Encounter Medications as of 08/10/2020  Medication Sig   acetaminophen (TYLENOL) 500 MG tablet Take 500 mg by mouth every 6 (six) hours as needed.   cyanocobalamin (,VITAMIN B-12,) 1000 MCG/ML injection Inject 1 mL (1,000 mcg total) into the muscle every 30 (thirty) days. (Patient not taking: Reported on 07/10/2020)   Erenumab-aooe (AIMOVIG) 70 MG/ML SOAJ Inject 70 mg into the skin every 30 (thirty) days.   hydrOXYzine (ATARAX/VISTARIL) 25 MG tablet Take 1-2 tablets (25-50 mg total) by mouth 3 (three) times daily as needed.   ibuprofen (ADVIL) 800 MG tablet Take 1 tablet (800 mg total) by mouth every 8 (eight) hours as needed.   Levonorgestrel (KYLEENA) 19.5 MG IUD 1 Device by Intrauterine route once.   nortriptyline (PAMELOR) 50 MG capsule Take 1 capsule (50 mg total) by mouth at bedtime.   ondansetron (ZOFRAN ODT) 4 MG disintegrating tablet Take 1 tablet (4 mg total) by mouth every 8 (eight) hours as needed for nausea or vomiting.   phentermine (ADIPEX-P) 37.5 MG tablet Take 1 tablet (37.5 mg total) by mouth daily before breakfast. (Patient not taking: Reported on 07/10/2020)   QUEtiapine Fumarate (SEROQUEL XR) 150 MG 24 hr tablet Take 1 tablet (150 mg total) by mouth at bedtime.   SUMAtriptan (IMITREX) 100 MG tablet Take 1/2 tablet at onset of migraine. May repeat in 2 hours if headache persists or  recurs.   Syringe/Needle, Disp, (SYRINGE 3CC/25GX1") 25G X 1" 3 ML MISC 1 each by Does not apply route every 30 (thirty) days.   No facility-administered encounter medications on file as of 08/10/2020.    Current Diagnosis: Patient Active Problem List   Diagnosis Date Noted   Pseudotumor cerebri 03/18/2020   B12 deficiency 01/17/2019   Bipolar 2 disorder (HCC) 06/11/2017   Idiopathic intracranial hypertension 05/25/2017   Papilledema 05/01/2017   Pelvic pain in female 05/20/2016   Hypocalcemia 03/11/2016   Hyperinsulinemia 03/11/2016   PCO (polycystic ovaries)    Obesity 10/17/2015   Migraine 10/17/2015   Cholelithiasis 10/16/2015    Goals Addressed   None    Initial Questions for Initial Appointment with PharmD Alexis Rangel.   Have you seen any other providers since your last visit? Yes, phone visit with PCP on 07-10-20 related to migraines.   Any changes in your medications or health? No  Any side effects from any medications? No  Do you have an symptoms or problems not managed by your medications? Yes, the patient stated, " I have uncontrolled migraines at least 2-3 a week. I have not been able to identify what triggers my headaches. When I do have a headache I have shooting pain starting from the back of my neck and going into my eyes."  Any concerns about your health right now? no  Has your provider asked that you check blood pressure, blood sugar, or follow special  diet at home? No  Do you get any type of exercise on a regular basis? No  Can you think of a goal you would like to reach for your health? Yes, the patient expressed she is wanting to have control over her migraines and loose weight.   Do you have any problems getting your medications? Yes, the patient reported she does NOT have any insurance or patient assistance for medications. She has attempted to apply for Medicaid but has been denied due to her income.   Is there anything that you would like to  discuss during the appointment? Patient expressed she is wanting to see what can be done about her chronic headaches. Also is wanting to know if there is any patient assistance so she can obtain her medications.   Instructed patient to  bring medications and supplements to appointment. The patient verbalized understanding of information. Reminded the patient about her IN PERSON scheduled office visit with PharmD, Alexis Rangel. Patient confirmed date and time of appointment.   Alexis Leigh, LPN Clinical Pharmacist Assistant  [phone]  Follow-Up:  Patient Assistance Coordination/PharmD visit on 08-13-20.   I have reviewed the care management and care coordination activities outlined in this encounter and I am certifying that I agree with the content of this note.  Mercer Pod. Tiburcio Pea PharmD, BCPS Clinical Pharmacist [facility] [phone]

## 2020-08-10 NOTE — Progress Notes (Signed)
Opened in error.  Burnard Leigh, LPN Clinical Pharmacist Assistant  [phone]

## 2020-08-12 NOTE — Progress Notes (Signed)
Chronic Care Management   Note  08/13/2020 Name: Alexis Rangel MRN: [id number] DOB: March 07, 1986  Reason for referral:  Financial barriers to injectable migraine therapy  Referral source: Dr. Wynetta Emery Referral medication(s): Aimovig Current insurance:None  PMHx: Polycystic ovaries, migraine, papilledema, idiopathic intracranial hypertension, bipolar disorder, cholelithiasis, and depression. IIH diagnosed 2018 Topamax prescribed   HPI: Intolerance to Topamax due to paresthesia. On amitrptyline. Failed naproxen, imitrex, and ibuprofen. Follows with neurology   Objective: Allergies  Allergen Reactions  . Topamax [Topiramate] Other (See Comments)    Paresthesias     Medications Reviewed Today    Reviewed by Vladimir Faster, [facility] (Pharmacist) on 08/13/20 at [phone]  Med List Status: <None>  Medication Order Taking? Sig Documenting Provider Last Dose Status Informant  acetaminophen (TYLENOL) 500 MG tablet [id number] Yes Take 500 mg by mouth every 6 (six) hours as needed. [provider] Taking Active Pharmacy Records  cyanocobalamin (,VITAMIN B-12,) 1000 MCG/ML injection [id number]  Inject 1 mL (1,000 mcg total) into the muscle every 30 (thirty) days.  Patient not taking: Reported on 07/10/2020   Joylene Igo, CNM  Active   Erenumab-aooe (AIMOVIG) 70 MG/ML Darden Palmer [id number] No Inject 70 mg into the skin every 30 (thirty) days.  Patient not taking: Reported on 08/13/2020   Valerie Roys, DO Not Taking Active   hydrOXYzine (ATARAX/VISTARIL) 25 MG tablet [id number] Yes Take 1-2 tablets (25-50 mg total) by mouth 3 (three) times daily as needed. Wynetta Emery, Megan P, DO Taking Active   ibuprofen (ADVIL) 800 MG tablet [id number] Yes Take 1 tablet (800 mg total) by mouth every 8 (eight) hours as needed. Johnson, Megan P, DO Taking Active   Levonorgestrel (KYLEENA) 19.5 MG IUD [id number] No 1 Device by Intrauterine route once. Joylene Igo, CNM Unknown Active Pharmacy Records   nortriptyline (PAMELOR) 50 MG capsule [id number] Yes Take 1 capsule (50 mg total) by mouth at bedtime. Wynetta Emery, Megan P, DO Taking Active   ondansetron (ZOFRAN ODT) 4 MG disintegrating tablet [id number] Yes Take 1 tablet (4 mg total) by mouth every 8 (eight) hours as needed for nausea or vomiting. Park Liter P, DO Taking Active   phentermine (ADIPEX-P) 37.5 MG tablet [id number] No Take 1 tablet (37.5 mg total) by mouth daily before breakfast.  Patient not taking: No sig reported   Shambley, Melody N, CNM Not Taking Active   QUEtiapine Fumarate (SEROQUEL XR) 150 MG 24 hr tablet [id number] No Take 1 tablet (150 mg total) by mouth at bedtime.  Patient not taking: Reported on 08/13/2020   Valerie Roys, DO Not Taking Active   SUMAtriptan (IMITREX) 100 MG tablet [id number] Yes Take 1/2 tablet at onset of migraine. May repeat in 2 hours if headache persists or recurs. Johnson, Megan P, DO Taking Active   Syringe/Needle, Disp, (SYRINGE 3CC/25GX1") 25G X 1" 3 ML MISC [id number] No 1 each by Does not apply route every 30 (thirty) days.  Patient not taking: Reported on 08/13/2020   Valerie Roys, DO Not Taking Active           Assessment: Uncontrolled migraines 2-3 times per week. No current insurance and previously told she would not qualify for Medicaid.   Medication Review Findings:  . Patient has not picked up Seroquel XR due to $55 copay. She will pick up after she gets paid on 08/15/20 . Has not completed [employer] paperwork. Will refer to care guide for community resources and [facility] MM for assistance with generic maintenance meds. Marland Kitchen No  samples available in office today . Patient has face to face with Neurology on 09/07/20  Medication Assistance Findings:  Extra Help:   '[]'  Already receiving Full Extra Help  '[]'  Already receiving Partial Extra Help  '[]'  Eligible based on reported income and assets  '[x]'  Not Eligible based on reported income and assets  Patient Assistance  Programs: 1) Aimovig made by [employer] o Income requirement met: '[x]'  Yes '[]'  No '[]'  Unknown o Out-of-pocket prescription expenditure met:    '[]'  Yes '[]'  No  '[]'  Unknown  '[x]'  Not applicable     Additional medication assistance options reviewed with patient as warranted:  No other options identified  Goals Addressed            This Visit's Progress   . Pharmacy-patient assistance       CARE PLAN ENTRY (see longitudinal plan of care for additional care plan information)  Current Barriers:  . Financial Barriers in complicated patient with multiple medical conditions including PCOS, bipolar disorder, papilledema, idiopathic intracranial hypertension, depression and migraine. Patient has no insurance and reports copay for Aimovigis cost prohibitive at this time at > $600. No samples available in office  Pharmacist Clinical Goal(s):  Marland Kitchen Over the next 30 days, patient will work with PharmD and providers to relieve medication access concerns  Interventions: . Comprehensive medication review completed; medication list updated in electronic medical record.  Martin Majestic by [employer]: Patient meets income criteria for this medication's patient assistance program. Reviewed application process. Patient will provide proof of income, out of pocket spend report, and will sign application. Will collaborate with primary care provider Dr. Wynetta Emery for their portion of application. Once completed, will submit to [employer] patient assistance program . Inter-disciplinary care team collaboration (see longitudinal plan of care) . Will refer to community resource care guide and medication management clinic  Patient Self Care Activities:  . Patient provided necessary portions of application   Initial goal documentation         Plan: Application completed during today's visit and placed in folder for prescriber signature.   Follow up:  2 weeks   Medication Management   Patient's preferred pharmacy is:  [facility] [address], [place] [address] Phone: [phone] Fax: [place], [place] [address] Phone: [phone] Fax: [phone]   Junita Push. Kenton Kingfisher PharmD, [facility] [phone]

## 2020-08-13 ENCOUNTER — Ambulatory Visit: Payer: Self-pay | Admitting: Pharmacist

## 2020-08-27 ENCOUNTER — Other Ambulatory Visit: Payer: Self-pay

## 2020-08-27 MED ORDER — AIMOVIG 70 MG/ML ~~LOC~~ SOAJ: 70.0000 mg | SUBCUTANEOUS | 3 refills | Status: DC

## 2020-09-18 ENCOUNTER — Telehealth: Payer: Self-pay | Admitting: Family Medicine

## 2020-09-25 ENCOUNTER — Other Ambulatory Visit: Payer: Self-pay

## 2020-09-25 ENCOUNTER — Encounter: Payer: Self-pay | Admitting: Certified Nurse Midwife

## 2020-09-25 ENCOUNTER — Ambulatory Visit: Payer: Self-pay | Admitting: Certified Nurse Midwife

## 2020-09-25 VITALS — BP 125/89 | HR 98 | Ht 62.0 in | Wt 243.1 lb

## 2020-09-25 DIAGNOSIS — Z01419 Encounter for gynecological examination (general) (routine) without abnormal findings: Secondary | ICD-10-CM

## 2020-09-25 NOTE — Progress Notes (Signed)
   GYNECOLOGY OFFICE PROCEDURE NOTE  Alexis Rangel is a 35 y.o. G0P0000 here for kyleen IUD removal and reinsertion. No GYN concerns.  Last pap smear was on 09/20/18 and was normal.  IUD Removal and Reinsertion  Patient identified, informed consent performed, consent signed.   Discussed risks of irregular bleeding, cramping, infection, malpositioning or misplacement of the IUD outside the uterus which may require further procedures. Also discussed >99% contraception efficacy, increased risk of ectopic pregnancy with failure of method.   Emphasized that this did not protect against STIs, condoms recommended during all sexual encounters.Advised to use backup contraception for one week as the risk of pregnancy is higher during the transition period of removing an IUD and replacing it with another one. Time out was performed. Speculum placed in the vagina. The strings of the IUD were grasped and pulled using ring forceps. The IUD was successfully removed in its entirety. The cervix was cleaned with Betadine x 2 and grasped anteriorly with a single tooth tenaculum.  The new kyleena IUD insertion apparatus was used to sound the uterus to 6 cm;  the IUD was then placed per manufacturer's recommendations. Strings trimmed to 3 cm. Tenaculum was removed, good hemostasis noted. Patient tolerated procedure well.   Patient was given post-procedure instructions.  She was reminded to have backup contraception for one week during this transition period between IUDs.  Patient was also asked to check IUD strings periodically and follow up in 4 weeks for IUD check.   Doreene Burke, CNM

## 2020-09-25 NOTE — Progress Notes (Signed)
GYNECOLOGY ANNUAL PREVENTATIVE CARE ENCOUNTER NOTE  History:     Alexis Rangel is a 35 y.o. G0P0000 female here for a routine annual gynecologic exam.  Current complaints: none.   Denies abnormal vaginal bleeding, discharge, pelvic pain, problems with intercourse or other gynecologic concerns.     Social Relationship: engaged Living: with her partner Work: Systems developer, paints Exercise: few time a month ( walks a lot with her job) Smoke/Alcohol/drug use: rare alcohol use, denies smoking/drug use   Gynecologic History No LMP recorded. (Menstrual status: IUD). Contraception: IUD, removed and replaced today  Last Pap: 09/20/18. Results were: normal with negative HPV Last mammogram: n/a    Upstream - 09/25/20 3428      Pregnancy Intention Screening   Does the patient want to become pregnant in the next year? No    Does the patient's partner want to become pregnant in the next year? No    Would the patient like to discuss contraceptive options today? No      Contraception Wrap Up   Current Method IUD or IUS    End Method IUD or IUS    Contraception Counseling Provided No          The pregnancy intention screening data noted above was reviewed. Potential methods of contraception were discussed. The patient elected to proceed with IUD or IUS.    Obstetric History OB History  Gravida Para Term Preterm AB Living  0 0 0 0 0 0  SAB IAB Ectopic Multiple Live Births  0 0 0 0      Past Medical History:  Diagnosis Date  . Bipolar 1 disorder (HCC)   . Depression   . Headache   . PCO (polycystic ovaries)     Past Surgical History:  Procedure Laterality Date  . ccy  06/09/2014   pt states she does not remember this surgery  . CHOLECYSTECTOMY    . TONSILLECTOMY AND ADENOIDECTOMY    . WISDOM TOOTH EXTRACTION      Current Outpatient Medications on File Prior to Visit  Medication Sig Dispense Refill  . acetaminophen (TYLENOL) 500 MG tablet Take 500 mg by  mouth every 6 (six) hours as needed.    . hydrOXYzine (ATARAX/VISTARIL) 25 MG tablet Take 1-2 tablets (25-50 mg total) by mouth 3 (three) times daily as needed. 180 tablet 6  . ibuprofen (ADVIL) 800 MG tablet Take 1 tablet (800 mg total) by mouth every 8 (eight) hours as needed. 90 tablet 1  . Levonorgestrel (KYLEENA) 19.5 MG IUD 1 Device by Intrauterine route once. 1 Intra Uterine Device 0  . nortriptyline (PAMELOR) 50 MG capsule Take 1 capsule (50 mg total) by mouth at bedtime. 90 capsule 1  . QUEtiapine Fumarate (SEROQUEL XR) 150 MG 24 hr tablet Take 1 tablet (150 mg total) by mouth at bedtime. 90 tablet 1  . SUMAtriptan (IMITREX) 100 MG tablet Take 1/2 tablet at onset of migraine. May repeat in 2 hours if headache persists or recurs. 9 tablet 12  . cyanocobalamin (,VITAMIN B-12,) 1000 MCG/ML injection Inject 1 mL (1,000 mcg total) into the muscle every 30 (thirty) days. (Patient not taking: No sig reported) 10 mL 1  . ondansetron (ZOFRAN ODT) 4 MG disintegrating tablet Take 1 tablet (4 mg total) by mouth every 8 (eight) hours as needed for nausea or vomiting. (Patient not taking: Reported on 09/25/2020) 20 tablet 6  . phentermine (ADIPEX-P) 37.5 MG tablet Take 1 tablet (37.5 mg total) by mouth  daily before breakfast. (Patient not taking: No sig reported) 30 tablet 2   No current facility-administered medications on file prior to visit.    Allergies  Allergen Reactions  . Topamax [Topiramate] Other (See Comments)    Paresthesias     Social History:  reports that she has never smoked. She has never used smokeless tobacco. She reports that she does not drink alcohol and does not use drugs.  Family History  Problem Relation Age of Onset  . Cancer Mother        cervical  . COPD Mother   . Hyperlipidemia Father   . Hypertension Father   . Migraines Father   . Heart attack Father   . Bipolar disorder Sister   . Heart disease Maternal Grandmother   . COPD Maternal Grandmother   . Heart  disease Maternal Grandfather   . Hypertension Paternal Grandmother   . Heart disease Paternal Grandmother   . Hypertension Paternal Grandfather   . Heart disease Paternal Grandfather   . Alzheimer's disease Paternal Grandfather   . Diabetes Neg Hx     The following portions of the patient's history were reviewed and updated as appropriate: allergies, current medications, past family history, past medical history, past social history, past surgical history and problem list.  Review of Systems Pertinent items noted in HPI and remainder of comprehensive ROS otherwise negative.  Physical Exam:  BP 125/89   Pulse 98   Ht 5\' 2"  (1.575 m)   Wt 243 lb 2 oz (110.3 kg)   BMI 44.47 kg/m  CONSTITUTIONAL: Well-developed, well-nourished, obese female in no acute distress.  HENT:  Normocephalic, atraumatic, External right and left ear normal. Oropharynx is clear and moist EYES: Conjunctivae and EOM are normal. Pupils are equal, round, and reactive to light. No scleral icterus.  NECK: Normal range of motion, supple, no masses.  Normal thyroid.  SKIN: Skin is warm and dry. No rash noted. Not diaphoretic. No erythema. No pallor. MUSCULOSKELETAL: Normal range of motion. No tenderness.  No cyanosis, clubbing, or edema.  2+ distal pulses. NEUROLOGIC: Alert and oriented to person, place, and time. Normal reflexes, muscle tone coordination.  PSYCHIATRIC: Normal mood and affect. Normal behavior. Normal judgment and thought content. CARDIOVASCULAR: Normal heart rate noted, regular rhythm RESPIRATORY: Clear to auscultation bilaterally. Effort and breath sounds normal, no problems with respiration noted. BREASTS: Symmetric in size. No masses, tenderness, skin changes, nipple drainage, or lymphadenopathy bilaterally.  ABDOMEN: Soft, no distention noted.  No tenderness, rebound or guarding.  PELVIC: Normal appearing external genitalia and urethral meatus; normal appearing vaginal mucosa and cervix.  No abnormal  discharge noted.  Pap smear not indicated   Normal uterine size, no other palpable masses, no uterine or adnexal tenderness. Difficult to assess due to body habitus   .   Assessment and Plan:    1. Women's annual routine gynecological examination  Pap: n/a  Mammogram : n/a  Labs:declines  Refills: n/a  Referral: none Routine preventative health maintenance measures emphasized. Please refer to After Visit Summary for other counseling recommendations.      , CNM [facility],  [facility]

## 2020-09-25 NOTE — Patient Instructions (Signed)

## 2020-10-23 ENCOUNTER — Encounter: Payer: Self-pay | Admitting: Certified Nurse Midwife

## 2020-10-23 ENCOUNTER — Other Ambulatory Visit: Payer: Self-pay

## 2020-10-23 ENCOUNTER — Ambulatory Visit (INDEPENDENT_AMBULATORY_CARE_PROVIDER_SITE_OTHER): Payer: Self-pay | Admitting: Certified Nurse Midwife

## 2020-10-23 VITALS — BP 115/70 | HR 88 | Ht 62.0 in | Wt 245.0 lb

## 2020-10-23 DIAGNOSIS — Z30431 Encounter for routine checking of intrauterine contraceptive device: Secondary | ICD-10-CM

## 2020-10-23 NOTE — Progress Notes (Signed)
  GYNECOLOGY OFFICE ENCOUNTER NOTE  History:  34 y.o. G0P0000 here today for today for IUD string check; Kyleena  IUD was placed  09/25/20. No complaints about the IUD, no concerning side effects.  The following portions of the patient's history were reviewed and updated as appropriate: allergies, current medications, past family history, past medical history, past social history, past surgical history and problem list. Last pap smear on 09/15/18 was normal, negative HRHPV.  Review of Systems:  Pertinent items are noted in HPI.   Objective:  Physical Exam Blood pressure 115/70, pulse 88, height 5\' 2"  (1.575 m), weight 245 lb (111.1 kg). CONSTITUTIONAL: Well-developed, well-nourished female in no acute distress.  HENT:  Normocephalic, atraumatic. External right and left ear normal. Oropharynx is clear and moist EYES: Conjunctivae and EOM are normal. Pupils are equal, round, and reactive to light. No scleral icterus.  NECK: Normal range of motion, supple, no masses CARDIOVASCULAR: Normal heart rate noted RESPIRATORY: Effort and breath sounds normal, no problems with respiration noted ABDOMEN: Soft, no distention noted.   PELVIC: Normal appearing external genitalia; normal appearing vaginal mucosa and cervix.  IUD strings visualized, about 3 cm in length outside cervix.   Assessment & Plan:  Patient to keep IUD in place for up to five years; can come in for removal if she desires pregnancy earlier or for any concerning side effects.  , CNM

## 2020-10-23 NOTE — Patient Instructions (Signed)

## 2020-11-20 ENCOUNTER — Encounter: Payer: Self-pay | Admitting: Certified Nurse Midwife

## 2020-11-28 ENCOUNTER — Encounter: Payer: Self-pay | Admitting: Certified Nurse Midwife

## 2020-11-28 DIAGNOSIS — Z1159 Encounter for screening for other viral diseases: Secondary | ICD-10-CM

## 2020-11-28 DIAGNOSIS — Z114 Encounter for screening for human immunodeficiency virus [HIV]: Secondary | ICD-10-CM

## 2020-12-24 ENCOUNTER — Telehealth (INDEPENDENT_AMBULATORY_CARE_PROVIDER_SITE_OTHER): Payer: 59 | Admitting: Family Medicine

## 2020-12-24 ENCOUNTER — Encounter: Payer: Self-pay | Admitting: Family Medicine

## 2020-12-24 DIAGNOSIS — J069 Acute upper respiratory infection, unspecified: Secondary | ICD-10-CM | POA: Diagnosis not present

## 2020-12-24 MED ORDER — PROMETHAZINE-CODEINE 6.25-10 MG/5ML PO SYRP: 5.0000 mL | ORAL_SOLUTION | Freq: Two times a day (BID) | ORAL | 0 refills | Status: DC | PRN

## 2020-12-24 MED ORDER — PREDNISONE 10 MG PO TABS: ORAL_TABLET | ORAL | 0 refills | Status: DC

## 2020-12-24 NOTE — Progress Notes (Signed)
There were no vitals taken for this visit.   Subjective:    Patient ID: Alexis Rangel, female    DOB: 1986/08/24, 35 y.o.   MRN: [id number]  HPI: Alexis Rangel is a 35 y.o. female  Chief Complaint  Patient presents with  . URI    Pt states she has been having a cough, congestion, headache, runny nose, drainage, and sinus pressure since this past Wednesday.    UPPER RESPIRATORY TRACT INFECTION- has not taken a covid test Duration: 5 days Worst symptom: drainage Fever: no Cough: yes Shortness of breath: no Wheezing: no Chest pain: no Chest tightness: no Chest congestion: no Nasal congestion: yes Runny nose: yes Post nasal drip: yes Sneezing: no Sore throat: no Swollen glands: no Sinus pressure: no Headache: yes Face pain: no Toothache: no Ear pain: no  Ear pressure: yes bilateral Eyes red/itching:no Eye drainage/crusting: no  Vomiting: no Rash: no Fatigue: yes Sick contacts: no Strep contacts: no  Context: better Recurrent sinusitis: no Relief with OTC cold/cough medications: no  Treatments attempted: cold/sinus, mucinex, anti-histamine and pseudoephedrine   Relevant past medical, surgical, family and social history reviewed and updated as indicated. Interim medical history since our last visit reviewed. Allergies and medications reviewed and updated.  Review of Systems  Constitutional: Positive for fatigue. Negative for activity change, appetite change, chills, diaphoresis, fever and unexpected weight change.  HENT: Positive for congestion, ear pain, postnasal drip and rhinorrhea. Negative for dental problem, drooling, ear discharge, facial swelling, hearing loss, mouth sores, nosebleeds, sinus pressure, sinus pain, sneezing, sore throat, tinnitus, trouble swallowing and voice change.   Respiratory: Positive for cough. Negative for apnea, choking, chest tightness, shortness of breath, wheezing and stridor.   Cardiovascular: Negative.    Gastrointestinal: Negative.   Musculoskeletal: Negative.   Psychiatric/Behavioral: Negative.     Per HPI unless specifically indicated above     Objective:    There were no vitals taken for this visit.  Wt Readings from Last 3 Encounters:  10/23/20 245 lb (111.1 kg)  09/25/20 243 lb 2 oz (110.3 kg)  03/16/20 242 lb (109.8 kg)    Physical Exam Vitals and nursing note reviewed.  Constitutional:      General: She is not in acute distress.    Appearance: Normal appearance. She is not ill-appearing, toxic-appearing or diaphoretic.  HENT:     Head: Normocephalic and atraumatic.     Right Ear: External ear normal.     Left Ear: External ear normal.     Nose: Nose normal.     Mouth/Throat:     Mouth: Mucous membranes are moist.     Pharynx: Oropharynx is clear.  Eyes:     General: No scleral icterus.       Right eye: No discharge.        Left eye: No discharge.     Conjunctiva/sclera: Conjunctivae normal.     Pupils: Pupils are equal, round, and reactive to light.  Pulmonary:     Effort: Pulmonary effort is normal. No respiratory distress.     Comments: Speaking in full sentences Musculoskeletal:        General: Normal range of motion.     Cervical back: Normal range of motion.  Skin:    Coloration: Skin is not jaundiced or pale.     Findings: No bruising, erythema, lesion or rash.  Neurological:     Mental Status: She is alert and oriented to person, place, and time. Mental status is at  baseline.  Psychiatric:        Mood and Affect: Mood normal.        Behavior: Behavior normal.        Thought Content: Thought content normal.        Judgment: Judgment normal.     Results for orders placed or performed during the hospital encounter of 12/26/19  Respiratory Panel by RT PCR (Flu A&B, Covid) - Nasopharyngeal Swab   Specimen: Nasopharyngeal Swab  Result Value Ref Range   SARS Coronavirus 2 by RT PCR NEGATIVE NEGATIVE   Influenza A by PCR NEGATIVE NEGATIVE    Influenza B by PCR NEGATIVE NEGATIVE  POC SARS Coronavirus 2 Ag  Result Value Ref Range   SARS Coronavirus 2 Ag NEGATIVE NEGATIVE      Assessment & Plan:   Problem List Items Addressed This Visit   None   Visit Diagnoses    Upper respiratory tract infection, unspecified type    -  Primary   Will treat with prednisone and cough syrup. Will swab for COVID. Self-quarantine until results are back. Call if not getting better or getting worse.    Relevant Orders   Novel Coronavirus, NAA ([facility])       Follow up plan: Return if symptoms worsen or fail to improve.   . This visit was completed via video visit through MyChart due to the restrictions of the COVID-19 pandemic. All issues as above were discussed and addressed. Physical exam was done as above through visual confirmation on video through MyChart. If it was felt that the patient should be evaluated in the office, they were directed there. The patient verbally consented to this visit. . Location of the patient: home . Location of the provider: work . Those involved with this call:  . Provider: Olevia Perches, DO . CMA: Wilhemena Durie, CMA . Front Desk/Registration: Harriet Pho  . Time spent on call: 15 minutes with patient face to face via video conference. More than 50% of this time was spent in counseling and coordination of care. 23 minutes total spent in review of patient's record and preparation of their chart.

## 2020-12-24 NOTE — Addendum Note (Signed)
Addended byMortimer Fries on: 12/24/2020 05:07 PM   Modules accepted: Orders

## 2020-12-25 LAB — NOVEL CORONAVIRUS, NAA: SARS-CoV-2, NAA: NOT DETECTED

## 2020-12-25 LAB — SARS-COV-2, NAA 2 DAY TAT

## 2021-02-06 ENCOUNTER — Ambulatory Visit: Payer: Self-pay

## 2021-02-06 ENCOUNTER — Telehealth (INDEPENDENT_AMBULATORY_CARE_PROVIDER_SITE_OTHER): Payer: 59 | Admitting: Nurse Practitioner

## 2021-02-06 ENCOUNTER — Ambulatory Visit
Admission: RE | Admit: 2021-02-06 | Discharge: 2021-02-06 | Disposition: A | Discharge status: Home / Self Care | Payer: 59 | Attending: Nurse Practitioner | Admitting: Nurse Practitioner

## 2021-02-06 ENCOUNTER — Other Ambulatory Visit: Payer: Self-pay

## 2021-02-06 ENCOUNTER — Encounter: Payer: Self-pay | Admitting: Nurse Practitioner

## 2021-02-06 ENCOUNTER — Ambulatory Visit
Admission: RE | Admit: 2021-02-06 | Discharge: 2021-02-06 | Disposition: A | Discharge status: Home / Self Care | Payer: 59 | Source: Ambulatory Visit | Attending: Nurse Practitioner | Admitting: Nurse Practitioner

## 2021-02-06 DIAGNOSIS — J069 Acute upper respiratory infection, unspecified: Secondary | ICD-10-CM

## 2021-02-06 DIAGNOSIS — R0602 Shortness of breath: Secondary | ICD-10-CM

## 2021-02-06 NOTE — Progress Notes (Signed)
There were no vitals taken for this visit.   Subjective:    Patient ID: Alexis Rangel, female    DOB: 1986/07/22, 35 y.o.   MRN: [id number]  HPI: Alexis Rangel is a 35 y.o. female  Chief Complaint  Patient presents with  . Cough    Wheezing, started on Thursday. Nasal congestion. Some mucus coming up with the cough, not much   UPPER RESPIRATORY TRACT INFECTION Patient states she tested on Saturday and Sunday for COVID and both were negative.  Worst symptom: wheezing Fever: no Cough: yes Shortness of breath: occassionally Wheezing: yes Chest pain: yes, with cough Chest tightness: yes Chest congestion: yes Nasal congestion: yes Runny nose: yes Post nasal drip: yes Sneezing: no Sore throat: yes Swollen glands: yes Sinus pressure: yes Headache: yes Face pain: no Toothache: no Ear pain: no bilateral Ear pressure: yes "right Eyes red/itching:no Eye drainage/crusting: no  Vomiting: yes Rash: no Fatigue: yes Sick contacts: no Strep contacts: no  Context: stable Recurrent sinusitis: no Relief with OTC cold/cough medications: no  Treatments attempted: none  Taking 800mg  ibuprofen for headaches.    Relevant past medical, surgical, family and social history reviewed and updated as indicated. Interim medical history since our last visit reviewed. Allergies and medications reviewed and updated.  Review of Systems  Constitutional: Positive for fatigue and fever.  HENT: Positive for congestion, ear pain, postnasal drip and sore throat. Negative for dental problem, rhinorrhea, sinus pressure, sinus pain and sneezing.   Respiratory: Positive for cough, shortness of breath and wheezing.   Cardiovascular: Positive for chest pain.  Gastrointestinal: Positive for vomiting.  Skin: Negative for rash.  Neurological: Positive for headaches.    Per HPI unless specifically indicated above     Objective:    There were no vitals taken for this visit.  Wt Readings  from Last 3 Encounters:  10/23/20 245 lb (111.1 kg)  09/25/20 243 lb 2 oz (110.3 kg)  03/16/20 242 lb (109.8 kg)    Physical Exam Vitals and nursing note reviewed.  HENT:     Head: Normocephalic.     Right Ear: Hearing normal.     Left Ear: Hearing normal.     Nose: Nose normal.  Eyes:     Pupils: Pupils are equal, round, and reactive to light.  Pulmonary:     Effort: Pulmonary effort is normal. No respiratory distress.  Neurological:     Mental Status: She is alert.  Psychiatric:        Mood and Affect: Mood normal.        Behavior: Behavior normal.        Thought Content: Thought content normal.        Judgment: Judgment normal.     Results for orders placed or performed in visit on 12/24/20  Novel Coronavirus, NAA ([facility])   Specimen: Saline  Result Value Ref Range   SARS-CoV-2, NAA Not Detected Not Detected  SARS-COV-2, NAA 2 DAY TAT  Result Value Ref Range   SARS-CoV-2, NAA 2 DAY TAT Performed       Assessment & Plan:   Problem List Items Addressed This Visit   None   Visit Diagnoses    Upper respiratory tract infection, unspecified type    -  Primary   Recommend staying hydrated. Can use Mucinex and Ibuprofen to help with symptoms. Chest xray ordered for SOB. Return to clinic if symptoms worsen.    Relevant Orders   DG Chest 2 View  SOB (shortness of breath)       Chest xray ordered due to SOB. Will make further recommendations based on imaging results.    Relevant Orders   DG Chest 2 View       Follow up plan: Return if symptoms worsen or fail to improve.    This visit was completed via MyChart due to the restrictions of the COVID-19 pandemic. All issues as above were discussed and addressed. Physical exam was done as above through visual confirmation on MyChart. If it was felt that the patient should be evaluated in the office, they were directed there. The patient verbally consented to this visit. 1. Location of the patient: Home 2. Location of  the provider: Office 3. Those involved with this call:  ? Provider: Larae Grooms, NP ? CMA: Hildreth Reel, CMA ? Front Desk/Registration: Harriet Pho 4. Time spent on call: 15 minutes with patient face to face via video conference. More than 50% of this time was spent in counseling and coordination of care. 20 minutes total spent in review of patient's record and preparation of their chart.

## 2021-02-06 NOTE — Telephone Encounter (Signed)
Pt. Reports she started coughing last week. "I have a deep cough, not coughing anything up." Had 2 negative home COVID 19 test.Has occasional wheezing. Virtual visit made today. Reason for Disposition . SEVERE coughing spells (e.g., whooping sound after coughing, vomiting after coughing)  Answer Assessment - Initial Assessment Questions 1. ONSET: "When did the cough begin?"      Last week 2. SEVERITY: "How bad is the cough today?"      Severe 3. SPUTUM: "Describe the color of your sputum" (none, dry cough; clear, white, yellow, green)     None 4. HEMOPTYSIS: "Are you coughing up any blood?" If so ask: "How much?" (flecks, streaks, tablespoons, etc.)     No 5. DIFFICULTY BREATHING: "Are you having difficulty breathing?" If Yes, ask: "How bad is it?" (e.g., mild, moderate, severe)    - MILD: No SOB at rest, mild SOB with walking, speaks normally in sentences, can lie down, no retractions, pulse < 100.    - MODERATE: SOB at rest, SOB with minimal exertion and prefers to sit, cannot lie down flat, speaks in phrases, mild retractions, audible wheezing, pulse 100-120.    - SEVERE: Very SOB at rest, speaks in single words, struggling to breathe, sitting hunched forward, retractions, pulse > 120      Mild 6. FEVER: "Do you have a fever?" If Yes, ask: "What is your temperature, how was it measured, and when did it start?"     No 7. CARDIAC HISTORY: "Do you have any history of heart disease?" (e.g., heart attack, congestive heart failure)      No 8. LUNG HISTORY: "Do you have any history of lung disease?"  (e.g., pulmonary embolus, asthma, emphysema)     No 9. PE RISK FACTORS: "Do you have a history of blood clots?" (or: recent major surgery, recent prolonged travel, bedridden)     No 10. OTHER SYMPTOMS: "Do you have any other symptoms?" (e.g., runny nose, wheezing, chest pain)       Wheezing 11. PREGNANCY: "Is there any chance you are pregnant?" "When was your last menstrual period?"        No 12. TRAVEL: "Have you traveled out of the country in the last month?" (e.g., travel history, exposures)       No  Protocols used: COUGH - ACUTE NON-PRODUCTIVE-A-AH

## 2021-02-11 NOTE — Progress Notes (Signed)
Hi Alexis Rangel.  Your chest xray was normal.  Hope you are feeling better.

## 2021-05-29 DIAGNOSIS — M25571 Pain in right ankle and joints of right foot: Secondary | ICD-10-CM | POA: Diagnosis not present

## 2021-05-29 DIAGNOSIS — F319 Bipolar disorder, unspecified: Secondary | ICD-10-CM | POA: Diagnosis not present

## 2021-05-29 DIAGNOSIS — S93401A Sprain of unspecified ligament of right ankle, initial encounter: Secondary | ICD-10-CM | POA: Diagnosis not present

## 2021-05-29 DIAGNOSIS — Z6841 Body Mass Index (BMI) 40.0 and over, adult: Secondary | ICD-10-CM | POA: Diagnosis not present

## 2021-06-11 ENCOUNTER — Telehealth: Payer: Self-pay | Admitting: Family Medicine

## 2021-06-11 ENCOUNTER — Other Ambulatory Visit: Payer: Self-pay

## 2021-06-11 ENCOUNTER — Ambulatory Visit (INDEPENDENT_AMBULATORY_CARE_PROVIDER_SITE_OTHER): Payer: 59 | Admitting: Family Medicine

## 2021-06-11 ENCOUNTER — Encounter: Payer: Self-pay | Admitting: Family Medicine

## 2021-06-11 VITALS — BP 109/62 | HR 102 | Ht 62.0 in | Wt 245.0 lb

## 2021-06-11 DIAGNOSIS — S93421A Sprain of deltoid ligament of right ankle, initial encounter: Secondary | ICD-10-CM | POA: Diagnosis not present

## 2021-06-11 MED ORDER — KETOROLAC TROMETHAMINE 60 MG/2ML IM SOLN
60.0000 mg | Freq: Once | INTRAMUSCULAR | Status: AC
  Administered 2021-06-11: 60 mg via INTRAMUSCULAR
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
## 2021-06-11 NOTE — Telephone Encounter (Signed)
Alex from HR is inquiring whether there is a cap time on the rest that the pt needs to get every two hours. She is inquiring for HR purposes. If there is no cap then HR will recommend FMLA Leave for the pt. Alex requested a call back at [phone] and states that if needed a vm can be left because it is a secured line.

## 2021-06-12 ENCOUNTER — Encounter: Payer: Self-pay | Admitting: Family Medicine

## 2021-06-12 NOTE — Telephone Encounter (Signed)
Okay, great.  Thanks

## 2021-06-12 NOTE — Telephone Encounter (Signed)
New letter on her chart. OK to fax to her HR. Thanks!

## 2021-06-13 DIAGNOSIS — S93401A Sprain of unspecified ligament of right ankle, initial encounter: Secondary | ICD-10-CM | POA: Diagnosis not present

## 2021-06-13 DIAGNOSIS — M25571 Pain in right ankle and joints of right foot: Secondary | ICD-10-CM | POA: Diagnosis not present

## 2021-06-13 DIAGNOSIS — R52 Pain, unspecified: Secondary | ICD-10-CM | POA: Insufficient documentation

## 2021-06-17 ENCOUNTER — Encounter: Payer: 59 | Admitting: Certified Nurse Midwife

## 2021-06-25 ENCOUNTER — Encounter: Payer: 59 | Admitting: Certified Nurse Midwife

## 2021-07-12 ENCOUNTER — Telehealth: Payer: Self-pay | Admitting: Certified Nurse Midwife

## 2021-07-12 ENCOUNTER — Emergency Department
Admission: EM | Admit: 2021-07-12 | Discharge: 2021-07-12 | Disposition: A | Discharge status: Home / Self Care | Payer: BC Managed Care – PPO | Attending: Emergency Medicine | Admitting: Emergency Medicine

## 2021-07-12 ENCOUNTER — Ambulatory Visit: Payer: Self-pay | Admitting: *Deleted

## 2021-07-12 ENCOUNTER — Other Ambulatory Visit: Payer: Self-pay

## 2021-07-12 DIAGNOSIS — N939 Abnormal uterine and vaginal bleeding, unspecified: Secondary | ICD-10-CM | POA: Insufficient documentation

## 2021-07-12 DIAGNOSIS — N941 Unspecified dyspareunia: Secondary | ICD-10-CM | POA: Diagnosis not present

## 2021-07-12 LAB — CHLAMYDIA/NGC RT PCR (ARMC ONLY)
Chlamydia Tr: NOT DETECTED
N gonorrhoeae: NOT DETECTED

## 2021-07-12 LAB — WET PREP, GENITAL
Clue Cells Wet Prep HPF POC: NONE SEEN
Sperm: NONE SEEN
Trich, Wet Prep: NONE SEEN
Yeast Wet Prep HPF POC: NONE SEEN

## 2021-07-12 LAB — POC URINE PREG, ED: Preg Test, Ur: NEGATIVE

## 2021-07-12 NOTE — Telephone Encounter (Signed)
Reason for Disposition  Patient sounds very sick or weak to the triager  Answer Assessment - Initial Assessment Questions 1. AMOUNT: "Describe the bleeding that you are having."    - SPOTTING: spotting, or pinkish / brownish mucous discharge; does not fill panty liner or pad    - MILD:  less than 1 pad / hour; less than patient's usual menstrual bleeding   - MODERATE: 1-2 pads / hour; 1 menstrual cup every 6 hours; small-medium blood clots (e.g., pea, grape, small coin)   - SEVERE: soaking 2 or more pads/hour for 2 or more hours; 1 menstrual cup every 2 hours; bleeding not contained by pads or continuous red blood from vagina; large blood clots (e.g., golf ball, large coin)      Moderate , blood clots pea size 2. ONSET: "When did the bleeding begin?" "Is it continuing now?"     Yesterday and continuing now  3. MENSTRUAL PERIOD: "When was the last normal menstrual period?" "How is this different than your period?"     Has IUD and usually no bleeding  4. REGULARITY: "How regular are your periods?"     Has IUD 5. ABDOMINAL PAIN: "Do you have any pain?" "How bad is the pain?"  (e.g., Scale 1-10; mild, moderate, or severe)   - MILD (1-3): doesn't interfere with normal activities, abdomen soft and not tender to touch    - MODERATE (4-7): interferes with normal activities or awakens from sleep, abdomen tender to touch    - SEVERE (8-10): excruciating pain, doubled over, unable to do any normal activities      Cramping lower abdomen 6. PREGNANCY: "Could you be pregnant?" "Are you sexually active?" "Did you recently give birth?"     Sexually active and noted spotting after intercourse  7. BREASTFEEDING: "Are you breastfeeding?"     na 8. HORMONES: "Are you taking any hormone medications, prescription or OTC?" (e.g., birth control pills, estrogen)     IUD .  9. BLOOD THINNERS: "Do you take any blood thinners?" (e.g., Coumadin/warfarin, Pradaxa/dabigatran, aspirin)     na 10. CAUSE: "What do you  think is causing the bleeding?" (e.g., recent gyn surgery, recent gyn procedure; known bleeding disorder, cervical cancer, polycystic ovarian disease, fibroids)         na 11. HEMODYNAMIC STATUS: "Are you weak or feeling lightheaded?" If Yes, ask: "Can you stand and walk normally?"        Dizzy lightheaded walking normal today but yesterday some changes in walking  12. OTHER SYMPTOMS: "What other symptoms are you having with the bleeding?" (e.g., passed tissue, vaginal discharge, fever, menstrual-type cramps)       Lower abdominal cramping  Protocols used: Vaginal Bleeding - Abnormal-A-AH

## 2021-07-12 NOTE — Discharge Instructions (Signed)
Follow up with your gynecologist.  Return to the ER for symptoms that change, worsen, or for new concerns.

## 2021-07-12 NOTE — Telephone Encounter (Signed)
Pt has IUD- had been having blood clots and bleeding- started yesterday- pt has apt on Tuesday.

## 2021-07-12 NOTE — Telephone Encounter (Signed)
C/o vaginal bleeding since "last few days" . Hx IUD in place and just changed approx. 1 year ago . Patient called her GYN and no available appt until Tuesday 07/16/21. Reports bleeding worse today and patient left work due to dizziness and lightheadedness. Denies difficulty breathing or feelings of passing out. C/o yesterday of feeling of balance walking but better today. Continues with dizziness and "blood gushing " when moving around. Blood clots size of pea noted with wiping. Reports she has noticed some spotting after intercourse in the past. Do available appt with GYN or PCP. Recommended UC or ED due to symptoms of dizziness and lightheadedness continue. Care advise given . Patient verbalized understanding of care advise and to call back or go to [facility] or ED or 911 if symptoms worsen.

## 2021-07-12 NOTE — ED Notes (Signed)
See triage note  presents with some vaginal spotting  thinks that her IUD maybe displaced

## 2021-07-12 NOTE — ED Provider Notes (Signed)
[facility] Emergency Department Provider Note  ____________________________________________  Time seen: Approximately 1:06 PM  I have reviewed the triage vital signs and the nursing notes.   HISTORY  Chief Complaint Vaginal Bleeding    HPI Alexis Rangel is a 35 y.o. female presents to the emergency department for treatment and evaluation of vaginal bleeding.  She states that she has an IUD and has not bled in almost a year.  This morning upon awakening, when she wiped she had a blood clot.  She states that she is still spotting.  She also states that over the past few days she has had pain with intercourse.  No vaginal discharge but has noticed a different smell.  No fever or abdominal pain.  Past Medical History:  Diagnosis Date   Bipolar 1 disorder (HCC)    Depression    Headache    PCO (polycystic ovaries)     Patient Active Problem List   Diagnosis Date Noted   Pseudotumor cerebri 03/18/2020   B12 deficiency 01/17/2019   Bipolar 2 disorder (HCC) 06/11/2017   Idiopathic intracranial hypertension 05/25/2017   Papilledema 05/01/2017   Pelvic pain in female 05/20/2016   Hypocalcemia 03/11/2016   Hyperinsulinemia 03/11/2016   PCO (polycystic ovaries)    Obesity 10/17/2015   Migraine 10/17/2015   Cholelithiasis 10/16/2015    Past Surgical History:  Procedure Laterality Date   ccy  06/09/2014   pt states she does not remember this surgery   CHOLECYSTECTOMY     TONSILLECTOMY AND ADENOIDECTOMY     WISDOM TOOTH EXTRACTION      Prior to Admission medications   Medication Sig Start Date End Date Taking? Authorizing Provider  acetaminophen (TYLENOL) 500 MG tablet Take 500 mg by mouth every 6 (six) hours as needed.    [provider]  hydrOXYzine (ATARAX/VISTARIL) 25 MG tablet Take 1-2 tablets (25-50 mg total) by mouth 3 (three) times daily as needed. 07/10/20   Johnson, Megan P, DO  ibuprofen (ADVIL) 800 MG tablet Take 1 tablet (800 mg  total) by mouth every 8 (eight) hours as needed. 07/10/20   Johnson, Megan P, DO  Levonorgestrel (KYLEENA) 19.5 MG IUD 1 Device by Intrauterine route once. 01/18/16   Shambley, Melody N, CNM  nortriptyline (PAMELOR) 50 MG capsule Take 1 capsule (50 mg total) by mouth at bedtime. 07/10/20   Johnson, Megan P, DO  QUEtiapine Fumarate (SEROQUEL XR) 150 MG 24 hr tablet Take 1 tablet (150 mg total) by mouth at bedtime. 07/10/20   Johnson, Megan P, DO  SUMAtriptan (IMITREX) 100 MG tablet Take 1/2 tablet at onset of migraine. May repeat in 2 hours if headache persists or recurs. 07/10/20   Dorcas Carrow, DO    Allergies Topamax [topiramate]  Family History  Problem Relation Age of Onset   Cancer Mother        cervical   COPD Mother    Hyperlipidemia Father    Hypertension Father    Migraines Father    Heart attack Father    Bipolar disorder Sister    Heart disease Maternal Grandmother    COPD Maternal Grandmother    Heart disease Maternal Grandfather    Hypertension Paternal Grandmother    Heart disease Paternal Grandmother    Hypertension Paternal Grandfather    Heart disease Paternal Grandfather    Alzheimer's disease Paternal Grandfather    Diabetes Neg Hx     Social History Social History   Tobacco Use   Smoking  status: Never   Smokeless tobacco: Never  Vaping Use   Vaping Use: Never used  Substance Use Topics   Alcohol use: No   Drug use: No    Review of Systems Constitutional: Negative for fever. Respiratory: Negative for shortness of breath or cough. Gastrointestinal: Negative for abdominal pain; negative for nausea , negative for vomiting. Genitourinary: Negative for dysuria , negative for vaginal discharge. Musculoskeletal: Negative for back pain. Skin: Negative for acute skin changes/rash/lesion. ____________________________________________   PHYSICAL EXAM:  VITAL SIGNS: ED Triage Vitals  Enc Vitals Group     BP 07/12/21 1218 123/83     Pulse Rate 07/12/21  1218 95     Resp 07/12/21 1218 18     Temp 07/12/21 1218 98.2 F (36.8 C)     Temp Source 07/12/21 1218 Oral     SpO2 07/12/21 1218 98 %     Weight 07/12/21 1219 240 lb (108.9 kg)     Height 07/12/21 1219 5\' 2"  (1.575 m)     Head Circumference --      Peak Flow --      Pain Score 07/12/21 1218 8     Pain Loc --      Pain Edu? --      Excl. in GC? --     Constitutional: Alert and oriented. Well appearing and in no acute distress. Eyes: Conjunctivae are normal. Head: Atraumatic. Nose: No congestion/rhinnorhea. Mouth/Throat: Mucous membranes are moist. Respiratory: Normal respiratory effort.  No retractions. Gastrointestinal: Bowel sounds active x 4; Abdomen is soft without rebound or guarding. Genitourinary: Pelvic exam: IUD strings are visible.  No blood or discharge in the vaginal vault.  Cervix is closed. Musculoskeletal: No extremity tenderness nor edema.  Neurologic:  Normal speech and language. No gross focal neurologic deficits are appreciated. Speech is normal. No gait instability. Skin:  Skin is warm, dry and intact. No rash noted on exposed skin. Psychiatric: Mood and affect are normal. Speech and behavior are normal.  ____________________________________________   LABS (all labs ordered are listed, but only abnormal results are displayed)  Labs Reviewed  WET PREP, GENITAL - Abnormal; Notable for the following components:      Result Value   WBC, Wet Prep HPF POC FEW (*)    All other components within normal limits  CHLAMYDIA/NGC RT PCR ([facility] ONLY)            POC URINE PREG, ED   ____________________________________________  RADIOLOGY  Not indicated ____________________________________________  Procedures  ____________________________________________    INITIAL IMPRESSION / ASSESSMENT AND PLAN / ED COURSE  Pertinent labs & imaging results that were available during my care of the patient were reviewed by me and considered in my medical decision making  (see chart for details).  35 year old female presenting to the emergency department for treatment and evaluation of vaginal bleeding.  Due to her IUD, vaginal bleeding is very uncommon.  See HPI for further details.  Pelvic exam is reassuring.  Both IUD strings are visible.  Wet prep and GC chlamydia are both negative.  Patient encouraged to follow-up with her gynecology if symptoms are not improving over the next few days.  She is advised to return to the emergency department if she develops abdominal pain, cramping, or heavy vaginal bleeding. ____________________________________________   FINAL CLINICAL IMPRESSION(S) / ED DIAGNOSES  Final diagnoses:  Vaginal bleeding    Note:  This document was prepared using Dragon voice recognition software and may include unintentional dictation errors.   Keyan Folson,  Kasandra Knudsen, FNP 07/12/21 Vinnie Langton    Shaune Pollack, MD 07/15/21 1616

## 2021-07-12 NOTE — ED Triage Notes (Signed)
Pt to ED for vaginal bleeding and cramping in pelvic area since yesterday, states thinks related to IUD. Sent from OB to confirm placement. States is using pantyliners and has gone through one a day.

## 2021-07-12 NOTE — ED Notes (Signed)
Patient stable and discharged with all personal belongings and AVS. AVS and discharge instructions reviewed with patient and opportunity for questions provided.   

## 2021-07-16 ENCOUNTER — Encounter: Payer: Self-pay | Admitting: Certified Nurse Midwife

## 2021-07-16 ENCOUNTER — Ambulatory Visit (INDEPENDENT_AMBULATORY_CARE_PROVIDER_SITE_OTHER): Payer: 59 | Admitting: Certified Nurse Midwife

## 2021-07-16 ENCOUNTER — Other Ambulatory Visit: Payer: Self-pay

## 2021-07-16 DIAGNOSIS — Z713 Dietary counseling and surveillance: Secondary | ICD-10-CM

## 2021-07-16 MED ORDER — CYANOCOBALAMIN 1000 MCG/ML IJ SOLN: 1000.0000 ug | Freq: Once | INTRAMUSCULAR | 5 refills | Status: AC

## 2021-07-16 MED ORDER — CYANOCOBALAMIN 1000 MCG/ML IJ SOLN
1000.0000 ug | Freq: Once | INTRAMUSCULAR | Status: AC
Start: 2021-07-16 — End: 2021-07-16
  Administered 2021-07-16: 1000 ug via INTRAMUSCULAR

## 2021-07-16 MED ORDER — PHENTERMINE HCL 37.5 MG PO TABS: 37.5000 mg | ORAL_TABLET | Freq: Every day | ORAL | 0 refills | Status: DC

## 2021-07-16 NOTE — Progress Notes (Signed)
Subjective:  Alexis Rangel is a 35 y.o. G0P0000 at Unknown being seen today for weight loss management- initial visit.  Patient reports General ROS: negative and reports previous weight loss attempts:   Onset was gradual over the past 2 yrs   Onset followed:  Poor diet , minimal physical activity  Associated symptoms include: fatigue, depression,  Previous/Current treatment includes: has used phentermine in the past . She did lose weight but she did not keep up with diet and exercise so over the past few yrs the weight has come back .   Pertinent medical history includes: none  Risk factors include: social isolation,  The patient has a surgical history of: none Pertinent social history includes: none E evaluation today  included: metabolic profile, hemoglobin A1c, thyroid panel  and lipid.  Past treatment has included: small frequent feedings, nutritional supplement,  vitamin B-12 injections, appetite stimulant, exercise management.  Discussed diet and exercise goal. She states she plans to cut out fried foods and eating out less. Will make goal to exercise 30 min 3 x wk to start out. .  The following portions of the patient's history were reviewed and updated as appropriate: allergies, current medications, past family history, past medical history, past social history, past surgical history and problem list.   Objective:   Vitals:   07/16/21 1351  BP: 136/82  Pulse: 93  Weight: 243 lb 9.6 oz (110.5 kg)  Height: 5\' 2"  (1.575 m)  Waist: 48.25 in   General:  Alert, oriented and cooperative. Patient is in no acute distress.  PE: Well groomed female in no current distress,   Mental Status: Normal mood and affect. Normal behavior. Normal judgment and thought content.   Current BMI: Body mass index is 44.56 kg/m.   Assessment and Plan:  Obesity   Plan: low carb, High protein diet RX for adipex 37.5 mg daily and B12 .ml monthly, to start now with first injection given  at today's visit. Reviewed side-effects common to both medications and expected outcomes. Increase daily water intake to at least 8 bottle a day, every day.  Goal is to reduse weight by 5 % by end of three months, and will re-evaluate then. Controlled substance agreement signed by pt Information sheet on phentermine use given and reviewed with pt.  Discussed discontinuation if goals not meet or @ 1 yr of use.   RTC in 4 weeks for Nurse visit to check weight & BP, and get next B12 injections.  PMP Aware Web site  [place], [place], [place] Refine Search Contact the [facility] Knowledge/Help Center Date of Birth: 16-Feb-1986 Recent Address: [address] [place], [place] View Linked Records (1) Other Tools/Metrics [facility] Report generated on 07/16/2021. Report Date Range: 07/17/2019 - 07/16/2021 PDF Report Export Narx Scores Narcotic 040 Sedative 020 Stimulant 000 Explanation and Guidance Overdose Risk Score 190 (Range 000-999) Explanation and Guidance State Indicators (0)    Please refer to After Visit Summary for other counseling recommendations.    06-24-1990, CNM        Consider the Low Glycemic Index Diet and 6 smaller meals daily .  This boosts your metabolism and regulates your sugars:   Use the protein bar by [employer] because they have lots of fiber in them  Find the low carb flatbreads, tortillas and pita breads for sandwiches:  [name] makes a pita bread and a flat bread , available at [address] [facility] and [employer]; [name] makes a low  carb flatbread available at [employer] and [employer] that is 9 net carbs and 100 cal Mission makes a low carb whole wheat tortilla available at [employer] most grocery stores with 6 net carbs and 210 cal  [place] yogurt can still have a lot of carbs .  [name] Light N fit has 80 cal and 8 carbs

## 2021-07-16 NOTE — Patient Instructions (Signed)

## 2021-07-17 LAB — COMPREHENSIVE METABOLIC PANEL
ALT: 14 IU/L (ref 0–32)
AST: 16 IU/L (ref 0–40)
Albumin/Globulin Ratio: 1.6 (ref 1.2–2.2)
Albumin: 4.1 g/dL (ref 3.8–4.8)
Alkaline Phosphatase: 105 IU/L (ref 44–121)
BUN/Creatinine Ratio: 10 (ref 9–23)
BUN: 7 mg/dL (ref 6–20)
Bilirubin Total: <0.2 mg/dL (ref 0.0–1.2)
CO2: 24 mmol/L (ref 20–29)
Calcium: 9.1 mg/dL (ref 8.7–10.2)
Chloride: 102 mmol/L (ref 96–106)
Creatinine, Ser: 0.71 mg/dL (ref 0.57–1.00)
Globulin, Total: 2.6 g/dL (ref 1.5–4.5)
Glucose: 73 mg/dL (ref 70–99)
Potassium: 4.2 mmol/L (ref 3.5–5.2)
Sodium: 140 mmol/L (ref 134–144)
Total Protein: 6.7 g/dL (ref 6.0–8.5)
eGFR: 114 mL/min/{1.73_m2} (ref >59)

## 2021-07-17 LAB — LIPID PANEL
Chol/HDL Ratio: 5.4 ratio — ABNORMAL HIGH (ref 0.0–4.4)
Cholesterol, Total: 198 mg/dL (ref 100–199)
HDL: 37 mg/dL — ABNORMAL LOW (ref >39)
LDL Chol Calc (NIH): 130 mg/dL — ABNORMAL HIGH (ref 0–99)
Triglycerides: 171 mg/dL — ABNORMAL HIGH (ref 0–149)
VLDL Cholesterol Cal: 31 mg/dL (ref 5–40)

## 2021-07-17 LAB — HEMOGLOBIN A1C
Est. average glucose Bld gHb Est-mCnc: 126 mg/dL
Hgb A1c MFr Bld: 6 % — ABNORMAL HIGH (ref 4.8–5.6)

## 2021-07-17 LAB — THYROID PANEL WITH TSH
Free Thyroxine Index: 1.7 (ref 1.2–4.9)
T3 Uptake Ratio: 26 % (ref 24–39)
T4, Total: 6.5 ug/dL (ref 4.5–12.0)
TSH: 2.02 u[IU]/mL (ref 0.450–4.500)

## 2021-07-19 ENCOUNTER — Telehealth: Payer: Self-pay | Admitting: Family Medicine

## 2021-07-19 NOTE — Telephone Encounter (Signed)
Pt dropped off FMLA forms to be completed. Placed in provider's folder. Pt asks for a call when they are completed.

## 2021-07-30 NOTE — Telephone Encounter (Signed)
Needs appt to discuss migraines

## 2021-07-30 NOTE — Telephone Encounter (Signed)
Pt dropped off FMLA papers to be filled out for headaches. Placed in providers folder.

## 2021-07-30 NOTE — Telephone Encounter (Signed)
Appt scheduled 12/6

## 2021-07-30 NOTE — Telephone Encounter (Signed)
Pt is calling to follow up concerning her FMLA forms, regarding her migraines. She stated she received FMLA forms for her ankle not her migraines. Pt stated she needs these forms as soon as possible and would like to schedule and appointment today after 4:00 PM with PCP.   Please call.

## 2021-08-06 ENCOUNTER — Other Ambulatory Visit: Payer: Self-pay

## 2021-08-06 ENCOUNTER — Encounter: Payer: Self-pay | Admitting: Family Medicine

## 2021-08-06 ENCOUNTER — Ambulatory Visit (INDEPENDENT_AMBULATORY_CARE_PROVIDER_SITE_OTHER): Payer: BC Managed Care – PPO | Admitting: Family Medicine

## 2021-08-06 VITALS — Wt 234.2 lb

## 2021-08-06 DIAGNOSIS — F3181 Bipolar II disorder: Secondary | ICD-10-CM

## 2021-08-06 DIAGNOSIS — G43109 Migraine with aura, not intractable, without status migrainosus: Secondary | ICD-10-CM | POA: Diagnosis not present

## 2021-08-06 DIAGNOSIS — G932 Benign intracranial hypertension: Secondary | ICD-10-CM

## 2021-08-06 MED ORDER — IBUPROFEN 800 MG PO TABS
800.0000 mg | ORAL_TABLET | Freq: Three times a day (TID) | ORAL | 6 refills | Status: DC | PRN
Start: 2021-08-06 — End: 2022-09-09

## 2021-08-06 MED ORDER — SUMATRIPTAN SUCCINATE 100 MG PO TABS: ORAL_TABLET | ORAL | 12 refills | Status: DC

## 2021-08-06 MED ORDER — NORTRIPTYLINE HCL 75 MG PO CAPS: 75.0000 mg | ORAL_CAPSULE | Freq: Every day | ORAL | 1 refills | Status: DC

## 2021-08-06 MED ORDER — QUETIAPINE FUMARATE ER 150 MG PO TB24: 150.0000 mg | ORAL_TABLET | Freq: Every day | ORAL | 1 refills | Status: DC

## 2021-08-06 MED ORDER — HYDROXYZINE HCL 25 MG PO TABS
25.0000 mg | ORAL_TABLET | Freq: Three times a day (TID) | ORAL | 6 refills | Status: DC | PRN
Start: 2021-08-06 — End: 2022-09-09

## 2021-08-06 NOTE — Assessment & Plan Note (Signed)
Will increase her nortriptyline. May need to get back into see neurology. Call with any concerns. Continue to monitor.

## 2021-08-06 NOTE — Progress Notes (Signed)
Wt 234 lb 3.2 oz (106.2 kg)   BMI 42.84 kg/m    Subjective:    Patient ID: Alexis Rangel, female    DOB: 11-03-1985, 35 y.o.   MRN: [id number]  HPI: Alexis Rangel is a 35 y.o. female  Chief Complaint  Patient presents with   Migraine    Patient is here to follow up on migraines, needs form for FMLA    MIGRAINES Duration: chronic Onset: sudden Severity: moderate Quality: sharp and tight Frequency: 2x a week Location: top of her head Headache duration: 1-2 days Radiation: no Time of day headache occurs: at random  Headache status at time of visit: asymptomatic Treatments attempted: imitrex, nortriptyline Aura: no Nausea:  yes Vomiting: no Photophobia:  no Phonophobia:  no Effect on social functioning:  no Numbers of missed days of school/work each month: 2-3x a month, 1 day Confusion:  no Gait disturbance/ataxia:  no Behavioral changes:  no Fevers:  no  BIPOLAR- has been under a lot of stress.  Mood status: uncontrolled Satisfied with current treatment?: yes Symptom severity: moderate  Duration of current treatment : chronic Side effects: no Medication compliance: good compliance Psychotherapy/counseling: no  Previous psychiatric medications: seroquel Depressed mood: yes Anxious mood: yes Anhedonia: no Significant weight loss or gain: no Insomnia: no  Fatigue: yes Feelings of worthlessness or guilt: yes Impaired concentration/indecisiveness: yes Suicidal ideations: no Hopelessness: no Crying spells: no Depression screen [facility] 2/9 08/06/2021 02/06/2021 03/09/2020 01/17/2019 12/16/2018  Decreased Interest 1 0 0 0 0  Down, Depressed, Hopeless 2 0 0 1 2  PHQ - 2 Score 3 0 0 1 2  Altered sleeping 2 0 '2 1 1  ' Tired, decreased energy 2 0 2 1 0  Change in appetite 3 0 0 1 0  Feeling bad or failure about yourself  2 0 0 1 0  Trouble concentrating 2 0 0 0 0  Moving slowly or fidgety/restless 1 0 0 1 0  Suicidal thoughts 0 0 0 0 0  PHQ-9 Score 15 0 '4 6 3   ' Difficult doing work/chores Somewhat difficult Not difficult at all - Not difficult at all Not difficult at all  Some recent data might be hidden   GAD 7 : Generalized Anxiety Score 08/06/2021 11/15/2018  Nervous, Anxious, on Edge 2 2  Control/stop worrying 3 3  Worry too much - different things 3 3  Trouble relaxing 3 3  Restless 3 2  Easily annoyed or irritable 3 3  Afraid - awful might happen 1 3  Total GAD 7 Score 18 19  Anxiety Difficulty Somewhat difficult Very difficult    Relevant past medical, surgical, family and social history reviewed and updated as indicated. Interim medical history since our last visit reviewed. Allergies and medications reviewed and updated.  Review of Systems  Constitutional: Negative.   HENT: Negative.    Respiratory: Negative.    Cardiovascular: Negative.   Gastrointestinal: Negative.   Neurological:  Positive for headaches. Negative for dizziness, tremors, seizures, syncope, facial asymmetry, speech difficulty, weakness, light-headedness and numbness.  Psychiatric/Behavioral:  Positive for agitation and dysphoric mood. Negative for behavioral problems, confusion, decreased concentration, hallucinations, self-injury, sleep disturbance and suicidal ideas. The patient is nervous/anxious. The patient is not hyperactive.    Per HPI unless specifically indicated above     Objective:    Wt 234 lb 3.2 oz (106.2 kg)   BMI 42.84 kg/m   Wt Readings from Last 3 Encounters:  08/06/21 234 lb 3.2 oz (  106.2 kg)  07/16/21 243 lb 9.6 oz (110.5 kg)  07/12/21 240 lb (108.9 kg)    Physical Exam Vitals and nursing note reviewed.  Constitutional:      General: She is not in acute distress.    Appearance: Normal appearance. She is obese. She is not ill-appearing, toxic-appearing or diaphoretic.  HENT:     Head: Normocephalic and atraumatic.     Right Ear: External ear normal.     Left Ear: External ear normal.     Nose: Nose normal.     Mouth/Throat:      Mouth: Mucous membranes are moist.     Pharynx: Oropharynx is clear.  Eyes:     General: No scleral icterus.       Right eye: No discharge.        Left eye: No discharge.     Extraocular Movements: Extraocular movements intact.     Conjunctiva/sclera: Conjunctivae normal.     Pupils: Pupils are equal, round, and reactive to light.  Cardiovascular:     Rate and Rhythm: Normal rate and regular rhythm.     Pulses: Normal pulses.     Heart sounds: Normal heart sounds. No murmur heard.   No friction rub. No gallop.  Pulmonary:     Effort: Pulmonary effort is normal. No respiratory distress.     Breath sounds: Normal breath sounds. No stridor. No wheezing, rhonchi or rales.  Chest:     Chest wall: No tenderness.  Musculoskeletal:        General: Normal range of motion.     Cervical back: Normal range of motion and neck supple.  Skin:    General: Skin is warm and dry.     Capillary Refill: Capillary refill takes less than 2 seconds.     Coloration: Skin is not jaundiced or pale.     Findings: No bruising, erythema, lesion or rash.  Neurological:     General: No focal deficit present.     Mental Status: She is alert and oriented to person, place, and time. Mental status is at baseline.  Psychiatric:        Mood and Affect: Mood normal.        Behavior: Behavior normal.        Thought Content: Thought content normal.        Judgment: Judgment normal.    Results for orders placed or performed in visit on 07/16/21  Hemoglobin A1c  Result Value Ref Range   Hgb A1c MFr Bld 6.0 (H) 4.8 - 5.6 %   Est. average glucose Bld gHb Est-mCnc 126 mg/dL  Comprehensive metabolic panel  Result Value Ref Range   Glucose 73 70 - 99 mg/dL   BUN 7 6 - 20 mg/dL   Creatinine, Ser 0.71 0.57 - 1.00 mg/dL   eGFR 114 >59 mL/min/1.73   BUN/Creatinine Ratio 10 9 - 23   Sodium 140 134 - 144 mmol/L   Potassium 4.2 3.5 - 5.2 mmol/L   Chloride 102 96 - 106 mmol/L   CO2 24 20 - 29 mmol/L   Calcium 9.1 8.7 -  10.2 mg/dL   Total Protein 6.7 6.0 - 8.5 g/dL   Albumin 4.1 3.8 - 4.8 g/dL   Globulin, Total 2.6 1.5 - 4.5 g/dL   Albumin/Globulin Ratio 1.6 1.2 - 2.2   Bilirubin Total <0.2 0.0 - 1.2 mg/dL   Alkaline Phosphatase 105 44 - 121 IU/L   AST 16 0 - 40 IU/L   ALT  14 0 - 32 IU/L  Thyroid Panel With TSH  Result Value Ref Range   TSH 2.020 0.450 - 4.500 uIU/mL   T4, Total 6.5 4.5 - 12.0 ug/dL   T3 Uptake Ratio 26 24 - 39 %   Free Thyroxine Index 1.7 1.2 - 4.9  Lipid Profile  Result Value Ref Range   Cholesterol, Total 198 100 - 199 mg/dL   Triglycerides 171 (H) 0 - 149 mg/dL   HDL 37 (L) >39 mg/dL   VLDL Cholesterol Cal 31 5 - 40 mg/dL   LDL Chol Calc (NIH) 130 (H) 0 - 99 mg/dL   Chol/HDL Ratio 5.4 (H) 0.0 - 4.4 ratio      Assessment & Plan:   Problem List Items Addressed This Visit       Cardiovascular and Mediastinum   Migraine    Will increase her nortriptyline to 13m and recheck 1 month. FMLA paperwork filled out. Recheck 1 month.       Relevant Medications   meloxicam (MOBIC) 15 MG tablet   nortriptyline (PAMELOR) 75 MG capsule   SUMAtriptan (IMITREX) 100 MG tablet   ibuprofen (ADVIL) 800 MG tablet     Nervous and Auditory   Pseudotumor cerebri - Primary    Will increase her nortriptyline. May need to get back into see neurology. Call with any concerns. Continue to monitor.         Other   Bipolar 2 disorder ([place]    Not under good control. Unclear if she's been taking medicine regularly due to fill dates- but she says she has. Will recheck in about a month.         Follow up plan: Return in about 4 weeks (around 09/03/2021) for physical.

## 2021-08-06 NOTE — Assessment & Plan Note (Signed)
Not under good control. Unclear if she's been taking medicine regularly due to fill dates- but she says she has. Will recheck in about a month.

## 2021-08-06 NOTE — Assessment & Plan Note (Signed)
Will increase her nortriptyline to 75mg  and recheck 1 month. FMLA paperwork filled out. Recheck 1 month.

## 2021-08-12 NOTE — Telephone Encounter (Signed)
Faxed over forms, called patient and made aware.

## 2021-08-12 NOTE — Telephone Encounter (Signed)
Pt called for status update, please advise. Wants to be called when form is submitted so she can notify her HR.

## 2021-08-28 ENCOUNTER — Encounter: Payer: 59 | Admitting: Certified Nurse Midwife

## 2021-09-18 ENCOUNTER — Encounter: Payer: 59 | Admitting: Family Medicine

## 2021-10-02 ENCOUNTER — Encounter: Payer: Self-pay | Admitting: Certified Nurse Midwife

## 2021-10-08 ENCOUNTER — Encounter: Payer: Self-pay | Admitting: Family Medicine

## 2021-10-08 ENCOUNTER — Other Ambulatory Visit: Payer: Self-pay

## 2021-10-08 ENCOUNTER — Ambulatory Visit: Payer: BC Managed Care – PPO | Admitting: Family Medicine

## 2021-10-08 VITALS — BP 116/77 | HR 92 | Temp 98.8°F | Wt 234.8 lb

## 2021-10-08 DIAGNOSIS — G932 Benign intracranial hypertension: Secondary | ICD-10-CM

## 2021-10-08 DIAGNOSIS — F3181 Bipolar II disorder: Secondary | ICD-10-CM | POA: Diagnosis not present

## 2021-10-08 DIAGNOSIS — G43109 Migraine with aura, not intractable, without status migrainosus: Secondary | ICD-10-CM | POA: Diagnosis not present

## 2021-10-08 MED ORDER — AIMOVIG 70 MG/ML ~~LOC~~ SOAJ: 70.0000 mg | SUBCUTANEOUS | 3 refills | Status: DC

## 2021-10-08 MED ORDER — NORTRIPTYLINE HCL 25 MG PO CAPS: 25.0000 mg | ORAL_CAPSULE | Freq: Every day | ORAL | 1 refills | Status: DC

## 2021-10-08 MED ORDER — KETOROLAC TROMETHAMINE 60 MG/2ML IM SOLN
60.0000 mg | Freq: Once | INTRAMUSCULAR | Status: AC
  Administered 2021-10-08: 60 mg via INTRAMUSCULAR

## 2021-10-08 NOTE — Progress Notes (Signed)
BP 116/77    Pulse 92    Temp 98.8 F (37.1 C) (Oral)    Wt 234 lb 12.8 oz (106.5 kg)    SpO2 98%    BMI 42.95 kg/m    Subjective:    Patient ID: Alexis Rangel, female    DOB: 01-14-1986, 36 y.o.   MRN: [id number]  HPI: MARYSE BRIERLEY is a 36 y.o. female  Chief Complaint  Patient presents with   Migraine    Pt states she has been having a headache/migraine off and on since Friday. Patient states taking the nortriptyline and the seroquel make her feel like a zombie.    MIGRAINES- has not been taking her nortriptyline because it makes her very tired when she wakes up to work. She is working at least 4-5 days a week Duration: chronic Onset: sudden Severity: severe Quality: sharp and aching and pressure Frequency: intermittent Location: top of her head and behind her eye Headache duration: days Radiation: no Time of day headache occurs: at random Headache status at time of visit: current headache- better than last week Treatments attempted: Treatments attempted: topamax, nortriptyline   Aura: no Nausea:  yes Vomiting: yes Photophobia:  yes Phonophobia:  yes Effect on social functioning:  yes Confusion:  no Gait disturbance/ataxia:  no Behavioral changes:  no Fevers:  no  Relevant past medical, surgical, family and social history reviewed and updated as indicated. Interim medical history since our last visit reviewed. Allergies and medications reviewed and updated.  Review of Systems  Constitutional: Negative.   Respiratory: Negative.    Cardiovascular: Negative.   Gastrointestinal: Negative.   Musculoskeletal: Negative.   Neurological:  Positive for headaches. Negative for dizziness, tremors, seizures, syncope, facial asymmetry, speech difficulty, weakness, light-headedness and numbness.  Hematological: Negative.   Psychiatric/Behavioral: Negative.     Per HPI unless specifically indicated above     Objective:    BP 116/77    Pulse 92    Temp 98.8 F  (37.1 C) (Oral)    Wt 234 lb 12.8 oz (106.5 kg)    SpO2 98%    BMI 42.95 kg/m   Wt Readings from Last 3 Encounters:  10/08/21 234 lb 12.8 oz (106.5 kg)  08/06/21 234 lb 3.2 oz (106.2 kg)  07/16/21 243 lb 9.6 oz (110.5 kg)    Physical Exam Vitals and nursing note reviewed.  Constitutional:      General: She is not in acute distress.    Appearance: Normal appearance. She is not ill-appearing, toxic-appearing or diaphoretic.  HENT:     Head: Normocephalic and atraumatic.     Right Ear: External ear normal.     Left Ear: External ear normal.     Nose: Nose normal.     Mouth/Throat:     Mouth: Mucous membranes are moist.     Pharynx: Oropharynx is clear.  Eyes:     General: No scleral icterus.       Right eye: No discharge.        Left eye: No discharge.     Extraocular Movements: Extraocular movements intact.     Conjunctiva/sclera: Conjunctivae normal.     Pupils: Pupils are equal, round, and reactive to light.  Cardiovascular:     Rate and Rhythm: Normal rate and regular rhythm.     Pulses: Normal pulses.     Heart sounds: Normal heart sounds. No murmur heard.   No friction rub. No gallop.  Pulmonary:     Effort:  Pulmonary effort is normal. No respiratory distress.     Breath sounds: Normal breath sounds. No stridor. No wheezing, rhonchi or rales.  Chest:     Chest wall: No tenderness.  Musculoskeletal:        General: Normal range of motion.     Cervical back: Normal range of motion and neck supple.  Skin:    General: Skin is warm and dry.     Capillary Refill: Capillary refill takes less than 2 seconds.     Coloration: Skin is not jaundiced or pale.     Findings: No bruising, erythema, lesion or rash.  Neurological:     General: No focal deficit present.     Mental Status: She is alert and oriented to person, place, and time. Mental status is at baseline.  Psychiatric:        Mood and Affect: Mood normal.        Behavior: Behavior normal.        Thought Content:  Thought content normal.        Judgment: Judgment normal.    Results for orders placed or performed in visit on 07/16/21  Hemoglobin A1c  Result Value Ref Range   Hgb A1c MFr Bld 6.0 (H) 4.8 - 5.6 %   Est. average glucose Bld gHb Est-mCnc 126 mg/dL  Comprehensive metabolic panel  Result Value Ref Range   Glucose 73 70 - 99 mg/dL   BUN 7 6 - 20 mg/dL   Creatinine, Ser 0.71 0.57 - 1.00 mg/dL   eGFR 114 >59 mL/min/1.73   BUN/Creatinine Ratio 10 9 - 23   Sodium 140 134 - 144 mmol/L   Potassium 4.2 3.5 - 5.2 mmol/L   Chloride 102 96 - 106 mmol/L   CO2 24 20 - 29 mmol/L   Calcium 9.1 8.7 - 10.2 mg/dL   Total Protein 6.7 6.0 - 8.5 g/dL   Albumin 4.1 3.8 - 4.8 g/dL   Globulin, Total 2.6 1.5 - 4.5 g/dL   Albumin/Globulin Ratio 1.6 1.2 - 2.2   Bilirubin Total <0.2 0.0 - 1.2 mg/dL   Alkaline Phosphatase 105 44 - 121 IU/L   AST 16 0 - 40 IU/L   ALT 14 0 - 32 IU/L  Thyroid Panel With TSH  Result Value Ref Range   TSH 2.020 0.450 - 4.500 uIU/mL   T4, Total 6.5 4.5 - 12.0 ug/dL   T3 Uptake Ratio 26 24 - 39 %   Free Thyroxine Index 1.7 1.2 - 4.9  Lipid Profile  Result Value Ref Range   Cholesterol, Total 198 100 - 199 mg/dL   Triglycerides 171 (H) 0 - 149 mg/dL   HDL 37 (L) >39 mg/dL   VLDL Cholesterol Cal 31 5 - 40 mg/dL   LDL Chol Calc (NIH) 130 (H) 0 - 99 mg/dL   Chol/HDL Ratio 5.4 (H) 0.0 - 4.4 ratio      Assessment & Plan:   Problem List Items Addressed This Visit       Cardiovascular and Mediastinum   Migraine - Primary    Not tolerating the higher dose of nortriptyline. Allergic to topamax and BP too low for propanolol. Will start aimovig and recheck 1 month. Call with any concerns.       Relevant Medications   nortriptyline (PAMELOR) 25 MG capsule   ketorolac (TORADOL) injection 60 mg   Erenumab-aooe (AIMOVIG) 70 MG/ML SOAJ   Other Relevant Orders   Ambulatory referral to Neurology     Nervous  and Auditory   Idiopathic intracranial hypertension    Acting up  again. Will get her back into neurology for ?lumbar puncture.       Pseudotumor cerebri    Acting up again. Will get her back into neurology for ?lumbar puncture.       Relevant Orders   Ambulatory referral to Neurology     Other   Bipolar 2 disorder [facility])    Doing well on her seroquel. Will continue current regimen for that and cut down on nortriptyline. Recheck fatigue in 1 month.         Follow up plan: Return in about 4 weeks (around 11/05/2021) for physical.

## 2021-10-08 NOTE — Assessment & Plan Note (Signed)
Acting up again. Will get her back into neurology for ?lumbar puncture.  °

## 2021-10-08 NOTE — Assessment & Plan Note (Signed)
Doing well on her seroquel. Will continue current regimen for that and cut down on nortriptyline. Recheck fatigue in 1 month.

## 2021-10-08 NOTE — Assessment & Plan Note (Signed)
Not tolerating the higher dose of nortriptyline. Allergic to topamax and BP too low for propanolol. Will start aimovig and recheck 1 month. Call with any concerns.

## 2021-10-08 NOTE — Assessment & Plan Note (Signed)
Acting up again. Will get her back into neurology for ?lumbar puncture.

## 2021-10-21 ENCOUNTER — Telehealth: Payer: Self-pay

## 2021-10-21 NOTE — Telephone Encounter (Signed)
Attempted to contact patient to ask about insurance coverage to initiate PA for Aimovig 70Mg /ML INJ

## 2021-10-21 NOTE — Telephone Encounter (Signed)
PA initiated via CoverMyMeds for Aimovig 70 MG/ML INJ Key: [id number] Waiting on determination

## 2021-10-23 NOTE — Telephone Encounter (Signed)
Aimovig approved via CoverMyMeds Called patient LVM to make aware.

## 2021-10-24 ENCOUNTER — Encounter: Payer: Self-pay | Admitting: Family Medicine

## 2021-10-24 ENCOUNTER — Ambulatory Visit (INDEPENDENT_AMBULATORY_CARE_PROVIDER_SITE_OTHER): Payer: BC Managed Care – PPO | Admitting: Family Medicine

## 2021-10-24 ENCOUNTER — Other Ambulatory Visit: Payer: Self-pay

## 2021-10-24 ENCOUNTER — Other Ambulatory Visit: Payer: Self-pay | Admitting: Family Medicine

## 2021-10-24 VITALS — BP 130/83 | HR 93 | Temp 98.4°F | Wt 234.4 lb

## 2021-10-24 DIAGNOSIS — G43109 Migraine with aura, not intractable, without status migrainosus: Secondary | ICD-10-CM | POA: Diagnosis not present

## 2021-10-24 MED ORDER — KETOROLAC TROMETHAMINE 60 MG/2ML IM SOLN: 60.0000 mg | Freq: Once | INTRAMUSCULAR | Status: DC

## 2021-10-24 MED ORDER — EMGALITY 120 MG/ML ~~LOC~~ SOAJ: 120.0000 mg | SUBCUTANEOUS | 12 refills | Status: DC

## 2021-10-24 NOTE — Telephone Encounter (Signed)
Requested medication (s) are due for refill today: yes  Requested medication (s) are on the active medication list: yes  Last refill:  10/08/21 #1.2 ml 3 refills  Future visit scheduled: yes seen today and in 2 weeks   Notes to clinic:  medication not assigned to a protocol . Do you want to refill Rx?     Requested Prescriptions  Pending Prescriptions Disp Refills   AIMOVIG 41 MG/ML SOAJ [Pharmacy Med Name: Aimovig 70 MG/ML Subcutaneous Solution Auto-injector] 1 mL 0    Sig: INJECT 70 MG INTO THE SKIN EVERY 30 DAYS     Off-Protocol Failed - 10/24/2021 11:12 AM      Failed - Medication not assigned to a protocol, review manually.      Passed - Valid encounter within last 12 months    Recent Outpatient Visits           2 weeks ago Migraine with aura and without status migrainosus, not intractable   [place], Megan P, DO   2 months ago Pseudotumor cerebri   [place], Megan P, DO   4 months ago Sprain of deltoid ligament of right ankle, initial encounter   [facility], Megan P, DO   8 months ago Upper respiratory tract infection, unspecified type   [place], NP   10 months ago Upper respiratory tract infection, unspecified type   [facility] [place], Barb Merino, DO       Future Appointments             Today Valerie Roys, DO [place], [place]   In 2 weeks Wynetta Emery, Megan P, DO [place], [facility]           Off-Protocol Failed - 10/24/2021 11:12 AM      Failed - Medication not assigned to a protocol, review manually.      Passed - Valid encounter within last 12 months    Recent Outpatient Visits           2 weeks ago Migraine with aura and without status migrainosus, not intractable   [place], Megan P, DO   2 months ago Pseudotumor cerebri   [place], Megan P, DO   4 months ago  Sprain of deltoid ligament of right ankle, initial encounter   [place], Megan P, DO   8 months ago Upper respiratory tract infection, unspecified type   [place], NP   10 months ago Upper respiratory tract infection, unspecified type   [facility] [place], Barb Merino, DO       Future Appointments             Today Valerie Roys, DO [place], [place]   In 2 weeks Wynetta Emery, Barb Merino, DO [employer], [facility]

## 2021-10-24 NOTE — Assessment & Plan Note (Signed)
Will change FMLA to say 1-8 hours as they have been giving her trouble a work. Will change from aimovig (which she never started due to cost) to emgality. Sample given today. Recheck 6 weeks. Call with any concerns.

## 2021-10-24 NOTE — Progress Notes (Signed)
BP 130/83    Pulse 93    Temp 98.4 F (36.9 C)    Wt 234 lb 6.4 oz (106.3 kg)    SpO2 98%    BMI 42.87 kg/m    Subjective:    Patient ID: Alexis Rangel, female    DOB: 06-Jan-1986, 36 y.o.   MRN: [id number]  HPI: Alexis Rangel is a 36 y.o. female  Chief Complaint  Patient presents with   Migraine    Patient states she can not afford medication even with insurance its too expensive. Would like other alternatives.  Patient was told by HR that her FMLA papers only covered her for when she calls out for a whole day for migraines and not if she leaves early.    MIGRAINES Duration: chronic Onset: sudden Severity: severe Quality: aching, sharp and pressure Frequency: intermittent Location: top of her head and behind her eyes Headache duration: days Radiation: no Time of day headache occurs: at random Headache status at time of visit: current headache Treatments attempted: Treatments attempted: rest, ice, heat, APAP, ibuprofen, aleve", excedrine, triptans, topamax, and amitriptyline   Aura: no Nausea:  yes Vomiting: yes Photophobia:  yes Phonophobia:  yes Effect on social functioning:  yes Numbers of missed days of school/work each month:  Confusion:  no Gait disturbance/ataxia:  no Behavioral changes:  no Fevers:  no  Relevant past medical, surgical, family and social history reviewed and updated as indicated. Interim medical history since our last visit reviewed. Allergies and medications reviewed and updated.  Review of Systems  Constitutional: Negative.   Respiratory: Negative.    Cardiovascular: Negative.   Musculoskeletal: Negative.   Neurological:  Positive for headaches. Negative for dizziness, tremors, seizures, syncope, facial asymmetry, speech difficulty, weakness, light-headedness and numbness.  Psychiatric/Behavioral: Negative.     Per HPI unless specifically indicated above     Objective:    BP 130/83    Pulse 93    Temp 98.4 F (36.9 C)     Wt 234 lb 6.4 oz (106.3 kg)    SpO2 98%    BMI 42.87 kg/m   Wt Readings from Last 3 Encounters:  10/24/21 234 lb 6.4 oz (106.3 kg)  10/08/21 234 lb 12.8 oz (106.5 kg)  08/06/21 234 lb 3.2 oz (106.2 kg)    Physical Exam Vitals and nursing note reviewed.  Constitutional:      General: She is not in acute distress.    Appearance: Normal appearance. She is not ill-appearing, toxic-appearing or diaphoretic.  HENT:     Head: Normocephalic and atraumatic.     Right Ear: External ear normal.     Left Ear: External ear normal.     Nose: Nose normal.     Mouth/Throat:     Mouth: Mucous membranes are moist.     Pharynx: Oropharynx is clear.  Eyes:     General: No scleral icterus.       Right eye: No discharge.        Left eye: No discharge.     Extraocular Movements: Extraocular movements intact.     Conjunctiva/sclera: Conjunctivae normal.     Pupils: Pupils are equal, round, and reactive to light.  Cardiovascular:     Rate and Rhythm: Normal rate and regular rhythm.     Pulses: Normal pulses.     Heart sounds: Normal heart sounds. No murmur heard.   No friction rub. No gallop.  Pulmonary:     Effort: Pulmonary effort is normal.  No respiratory distress.     Breath sounds: Normal breath sounds. No stridor. No wheezing, rhonchi or rales.  Chest:     Chest wall: No tenderness.  Musculoskeletal:        General: Normal range of motion.     Cervical back: Normal range of motion and neck supple.  Skin:    General: Skin is warm and dry.     Capillary Refill: Capillary refill takes less than 2 seconds.     Coloration: Skin is not jaundiced or pale.     Findings: No bruising, erythema, lesion or rash.  Neurological:     General: No focal deficit present.     Mental Status: She is alert and oriented to person, place, and time. Mental status is at baseline.  Psychiatric:        Mood and Affect: Mood normal.        Behavior: Behavior normal.        Thought Content: Thought content normal.         Judgment: Judgment normal.    Results for orders placed or performed in visit on 07/16/21  Hemoglobin A1c  Result Value Ref Range   Hgb A1c MFr Bld 6.0 (H) 4.8 - 5.6 %   Est. average glucose Bld gHb Est-mCnc 126 mg/dL  Comprehensive metabolic panel  Result Value Ref Range   Glucose 73 70 - 99 mg/dL   BUN 7 6 - 20 mg/dL   Creatinine, Ser 0.71 0.57 - 1.00 mg/dL   eGFR 114 >59 mL/min/1.73   BUN/Creatinine Ratio 10 9 - 23   Sodium 140 134 - 144 mmol/L   Potassium 4.2 3.5 - 5.2 mmol/L   Chloride 102 96 - 106 mmol/L   CO2 24 20 - 29 mmol/L   Calcium 9.1 8.7 - 10.2 mg/dL   Total Protein 6.7 6.0 - 8.5 g/dL   Albumin 4.1 3.8 - 4.8 g/dL   Globulin, Total 2.6 1.5 - 4.5 g/dL   Albumin/Globulin Ratio 1.6 1.2 - 2.2   Bilirubin Total <0.2 0.0 - 1.2 mg/dL   Alkaline Phosphatase 105 44 - 121 IU/L   AST 16 0 - 40 IU/L   ALT 14 0 - 32 IU/L  Thyroid Panel With TSH  Result Value Ref Range   TSH 2.020 0.450 - 4.500 uIU/mL   T4, Total 6.5 4.5 - 12.0 ug/dL   T3 Uptake Ratio 26 24 - 39 %   Free Thyroxine Index 1.7 1.2 - 4.9  Lipid Profile  Result Value Ref Range   Cholesterol, Total 198 100 - 199 mg/dL   Triglycerides 171 (H) 0 - 149 mg/dL   HDL 37 (L) >39 mg/dL   VLDL Cholesterol Cal 31 5 - 40 mg/dL   LDL Chol Calc (NIH) 130 (H) 0 - 99 mg/dL   Chol/HDL Ratio 5.4 (H) 0.0 - 4.4 ratio      Assessment & Plan:   Problem List Items Addressed This Visit       Cardiovascular and Mediastinum   Migraine - Primary    Will change FMLA to say 1-8 hours as they have been giving her trouble a work. Will change from aimovig (which she never started due to cost) to emgality. Sample given today. Recheck 6 weeks. Call with any concerns.       Relevant Medications   Galcanezumab-gnlm (EMGALITY) 120 MG/ML SOAJ     Follow up plan: Return in about 6 weeks (around 12/05/2021).

## 2021-10-29 ENCOUNTER — Telehealth: Payer: Self-pay | Admitting: Family Medicine

## 2021-10-29 NOTE — Telephone Encounter (Signed)
Copied from [place] [id number]. Topic: Appointment Scheduling - Scheduling Inquiry for Clinic >> Oct 29, 2021 12:57 PM Pawlus, Apolonio Schneiders wrote: Reason for CRM: Pt has a CPE scheduled on 3/9 and wanted to know if she could also discuss FMLA paperwork at this time or if she needs to keep her office visit and come back the following day on 3/10, please advise. Please advise if this can all be done in one day.

## 2021-10-29 NOTE — Telephone Encounter (Signed)
Notified pt through MyChart

## 2021-10-29 NOTE — Telephone Encounter (Signed)
OK to do in 1 appt

## 2021-11-07 ENCOUNTER — Ambulatory Visit (INDEPENDENT_AMBULATORY_CARE_PROVIDER_SITE_OTHER): Payer: BC Managed Care – PPO | Admitting: Family Medicine

## 2021-11-07 ENCOUNTER — Other Ambulatory Visit: Payer: Self-pay

## 2021-11-07 ENCOUNTER — Encounter: Payer: Self-pay | Admitting: Family Medicine

## 2021-11-07 VITALS — BP 123/85 | HR 84 | Temp 98.2°F | Ht 62.0 in | Wt 237.8 lb

## 2021-11-07 DIAGNOSIS — Z0000 Encounter for general adult medical examination without abnormal findings: Secondary | ICD-10-CM | POA: Diagnosis not present

## 2021-11-07 LAB — URINALYSIS, ROUTINE W REFLEX MICROSCOPIC
Bilirubin, UA: NEGATIVE
Glucose, UA: NEGATIVE
Ketones, UA: NEGATIVE
Leukocytes,UA: NEGATIVE
Nitrite, UA: NEGATIVE
Protein,UA: NEGATIVE
RBC, UA: NEGATIVE
Specific Gravity, UA: 1.025 (ref 1.005–1.030)
Urobilinogen, Ur: 0.2 mg/dL (ref 0.2–1.0)
pH, UA: 6.5 (ref 5.0–7.5)

## 2021-11-07 NOTE — Progress Notes (Signed)
BP 123/85    Pulse 84    Temp 98.2 F (36.8 C)    Ht '5\' 2"'  (1.575 m)    Wt 237 lb 12.8 oz (107.9 kg)    BMI 43.49 kg/m    Subjective:    Patient ID: Alexis Rangel, female    DOB: [birth year], 36 y.o.   MRN: [id number]  HPI: Alexis Rangel is a 36 y.o. female presenting on 11/07/2021 for comprehensive medical examination. Current medical complaints include:  Headaches have not really gotten any better. Has only been on the emgality for about 2-3 weeks.   Menopausal Symptoms: no  Depression Screen done today and results listed below:  Depression screen [facility] 2/9 11/07/2021 10/08/2021 08/06/2021 02/06/2021 03/09/2020  Decreased Interest '1 1 1 ' 0 0  Down, Depressed, Hopeless 1 0 2 0 0  PHQ - 2 Score '2 1 3 ' 0 0  Altered sleeping 0 1 2 0 2  Tired, decreased energy '1 1 2 ' 0 2  Change in appetite '2 1 3 ' 0 0  Feeling bad or failure about yourself  '1 1 2 ' 0 0  Trouble concentrating 0 0 2 0 0  Moving slowly or fidgety/restless 0 0 1 0 0  Suicidal thoughts 0 0 0 0 0  PHQ-9 Score '6 5 15 ' 0 4  Difficult doing work/chores - Somewhat difficult Somewhat difficult Not difficult at all -  Some recent data might be hidden    Past Medical History:  Past Medical History:  Diagnosis Date   Bipolar 1 disorder ([place])    Depression    Headache    PCO (polycystic ovaries)     Surgical History:  Past Surgical History:  Procedure Laterality Date   ccy  06/09/2014   pt states she does not remember this surgery   CHOLECYSTECTOMY     TONSILLECTOMY AND ADENOIDECTOMY     WISDOM TOOTH EXTRACTION      Medications:  Current Outpatient Medications on File Prior to Visit  Medication Sig   acetaminophen (TYLENOL) 500 MG tablet Take 500 mg by mouth every 6 (six) hours as needed.   fluticasone (FLONASE) 50 MCG/ACT nasal spray fluticasone propionate 50 mcg/actuation nasal spray,suspension   Galcanezumab-gnlm (EMGALITY) 120 MG/ML SOAJ Inject 120 mg into the skin every 30 (thirty) days.   hydrOXYzine (ATARAX) 25  MG tablet Take 1-2 tablets (25-50 mg total) by mouth 3 (three) times daily as needed.   ibuprofen (ADVIL) 800 MG tablet Take 1 tablet (800 mg total) by mouth every 8 (eight) hours as needed.   Levonorgestrel (KYLEENA) 19.5 MG IUD 1 Device by Intrauterine route once.   meloxicam (MOBIC) 15 MG tablet Mobic 15 mg tablet  Take 1 tablet every day by oral route as needed.  Don't use with other anti-inflammatories   nortriptyline (PAMELOR) 25 MG capsule Take 1-2 capsules (25-50 mg total) by mouth at bedtime.   ondansetron (ZOFRAN-ODT) 4 MG disintegrating tablet ondansetron 4 mg disintegrating tablet  DISSOLVE 1 TABLET IN MOUTH EVERY 8 HOURS AS NEEDED FOR NAUSEA FOR VOMITING   QUEtiapine Fumarate (SEROQUEL XR) 150 MG 24 hr tablet Take 1 tablet (150 mg total) by mouth at bedtime.   SUMAtriptan (IMITREX) 100 MG tablet Take 1/2 tablet at onset of migraine. May repeat in 2 hours if headache persists or recurs.   No current facility-administered medications on file prior to visit.    Allergies:  Allergies  Allergen Reactions   Topamax [Topiramate] Other (See Comments)    Paresthesias  Social History:  Social History   Socioeconomic History   Marital status: Single    Spouse name: Not on file   Number of children: Not on file   Years of education: Not on file   Highest education level: Not on file  Occupational History   Not on file  Tobacco Use   Smoking status: Never   Smokeless tobacco: Never  Vaping Use   Vaping Use: Never used  Substance and Sexual Activity   Alcohol use: No   Drug use: No   Sexual activity: Yes    Birth control/protection: I.U.D.    Comment: kyleena- inserted 2022  Other Topics Concern   Not on file  Social History Narrative   Not on file   Social Determinants of Health   Financial Resource Strain: Not on file  Food Insecurity: Not on file  Transportation Needs: Not on file  Physical Activity: Not on file  Stress: Not on file  Social Connections:  Not on file  Intimate Partner Violence: Not on file   Social History   Tobacco Use  Smoking Status Never  Smokeless Tobacco Never   Social History   Substance and Sexual Activity  Alcohol Use No    Family History:  Family History  Problem Relation Age of Onset   Cancer Mother        cervical   COPD Mother    Hyperlipidemia Father    Hypertension Father    Migraines Father    Heart attack Father    Bipolar disorder Sister    Heart disease Maternal Grandmother    COPD Maternal Grandmother    Heart disease Maternal Grandfather    Hypertension Paternal Grandmother    Heart disease Paternal Grandmother    Hypertension Paternal Grandfather    Heart disease Paternal Grandfather    Alzheimer's disease Paternal Grandfather    Diabetes Neg Hx     Past medical history, surgical history, medications, allergies, family history and social history reviewed with patient today and changes made to appropriate areas of the chart.   Review of Systems  Constitutional: Negative.   HENT:  Positive for tinnitus. Negative for congestion, ear discharge, ear pain, hearing loss, nosebleeds, sinus pain and sore throat.   Eyes: Negative.   Respiratory: Negative.  Negative for stridor.   Cardiovascular: Negative.   Gastrointestinal:  Positive for heartburn. Negative for abdominal pain, blood in stool, constipation, diarrhea, melena, nausea and vomiting.  Genitourinary: Negative.   Musculoskeletal: Negative.   Skin: Negative.   Neurological:  Positive for dizziness and headaches. Negative for tingling, tremors, sensory change, speech change, focal weakness, seizures, loss of consciousness and weakness.  Endo/Heme/Allergies: Negative.   Psychiatric/Behavioral: Negative.    All other ROS negative except what is listed above and in the HPI.      Objective:    BP 123/85    Pulse 84    Temp 98.2 F (36.8 C)    Ht '5\' 2"'  (1.575 m)    Wt 237 lb 12.8 oz (107.9 kg)    BMI 43.49 kg/m   Wt Readings  from Last 3 Encounters:  11/07/21 237 lb 12.8 oz (107.9 kg)  10/24/21 234 lb 6.4 oz (106.3 kg)  10/08/21 234 lb 12.8 oz (106.5 kg)    Physical Exam Vitals and nursing note reviewed.  Constitutional:      General: She is not in acute distress.    Appearance: Normal appearance. She is obese. She is not ill-appearing, toxic-appearing or diaphoretic.  HENT:     Head: Normocephalic and atraumatic.     Right Ear: Tympanic membrane, ear canal and external ear normal. There is no impacted cerumen.     Left Ear: Tympanic membrane, ear canal and external ear normal. There is no impacted cerumen.     Nose: Nose normal. No congestion or rhinorrhea.     Mouth/Throat:     Mouth: Mucous membranes are moist.     Pharynx: Oropharynx is clear. No oropharyngeal exudate or posterior oropharyngeal erythema.  Eyes:     General: No scleral icterus.       Right eye: No discharge.        Left eye: No discharge.     Extraocular Movements: Extraocular movements intact.     Conjunctiva/sclera: Conjunctivae normal.     Pupils: Pupils are equal, round, and reactive to light.  Neck:     Vascular: No carotid bruit.  Cardiovascular:     Rate and Rhythm: Normal rate and regular rhythm.     Pulses: Normal pulses.     Heart sounds: No murmur heard.   No friction rub. No gallop.  Pulmonary:     Effort: Pulmonary effort is normal. No respiratory distress.     Breath sounds: Normal breath sounds. No stridor. No wheezing, rhonchi or rales.  Chest:     Chest wall: No tenderness.  Abdominal:     General: Abdomen is flat. Bowel sounds are normal. There is no distension.     Palpations: Abdomen is soft. There is no mass.     Tenderness: There is no abdominal tenderness. There is no right CVA tenderness, left CVA tenderness, guarding or rebound.     Hernia: No hernia is present.  Genitourinary:    Comments: Breast and pelvic exams deferred with shared decision making Musculoskeletal:        General: No swelling,  tenderness, deformity or signs of injury.     Cervical back: Normal range of motion and neck supple. No rigidity. No muscular tenderness.     Right lower leg: No edema.     Left lower leg: No edema.  Lymphadenopathy:     Cervical: No cervical adenopathy.  Skin:    General: Skin is warm and dry.     Capillary Refill: Capillary refill takes less than 2 seconds.     Coloration: Skin is not jaundiced or pale.     Findings: No bruising, erythema, lesion or rash.  Neurological:     General: No focal deficit present.     Mental Status: She is alert and oriented to person, place, and time. Mental status is at baseline.     Cranial Nerves: No cranial nerve deficit.     Sensory: No sensory deficit.     Motor: No weakness.     Coordination: Coordination normal.     Gait: Gait normal.     Deep Tendon Reflexes: Reflexes normal.  Psychiatric:        Mood and Affect: Mood normal.        Behavior: Behavior normal.        Thought Content: Thought content normal.        Judgment: Judgment normal.    Results for orders placed or performed in visit on 07/16/21  Hemoglobin A1c  Result Value Ref Range   Hgb A1c MFr Bld 6.0 (H) 4.8 - 5.6 %   Est. average glucose Bld gHb Est-mCnc 126 mg/dL  Comprehensive metabolic panel  Result Value Ref Range  Glucose 73 70 - 99 mg/dL   BUN 7 6 - 20 mg/dL   Creatinine, Ser 0.71 0.57 - 1.00 mg/dL   eGFR 114 >59 mL/min/1.73   BUN/Creatinine Ratio 10 9 - 23   Sodium 140 134 - 144 mmol/L   Potassium 4.2 3.5 - 5.2 mmol/L   Chloride 102 96 - 106 mmol/L   CO2 24 20 - 29 mmol/L   Calcium 9.1 8.7 - 10.2 mg/dL   Total Protein 6.7 6.0 - 8.5 g/dL   Albumin 4.1 3.8 - 4.8 g/dL   Globulin, Total 2.6 1.5 - 4.5 g/dL   Albumin/Globulin Ratio 1.6 1.2 - 2.2   Bilirubin Total <0.2 0.0 - 1.2 mg/dL   Alkaline Phosphatase 105 44 - 121 IU/L   AST 16 0 - 40 IU/L   ALT 14 0 - 32 IU/L  Thyroid Panel With TSH  Result Value Ref Range   TSH 2.020 0.450 - 4.500 uIU/mL   T4, Total  6.5 4.5 - 12.0 ug/dL   T3 Uptake Ratio 26 24 - 39 %   Free Thyroxine Index 1.7 1.2 - 4.9  Lipid Profile  Result Value Ref Range   Cholesterol, Total 198 100 - 199 mg/dL   Triglycerides 171 (H) 0 - 149 mg/dL   HDL 37 (L) >39 mg/dL   VLDL Cholesterol Cal 31 5 - 40 mg/dL   LDL Chol Calc (NIH) 130 (H) 0 - 99 mg/dL   Chol/HDL Ratio 5.4 (H) 0.0 - 4.4 ratio      Assessment & Plan:   Problem List Items Addressed This Visit   None Visit Diagnoses     Routine general medical examination at a health care facility    -  Primary   Vaccines up to date/declined. Screening labs checked today. Pap up to date. Continue diet and exercise. Call with any concerns.    Relevant Orders   CBC with Differential/Platelet   Comprehensive metabolic panel   Lipid Panel w/o Chol/HDL Ratio   Cytology - PAP   Urinalysis, Routine w reflex microscopic   TSH   HIV Antibody (routine testing w rflx)   Hepatitis C Antibody        Follow up plan: Return As scheduled.   LABORATORY TESTING:  - Pap smear: up to date  IMMUNIZATIONS:   - Tdap: Tetanus vaccination status reviewed: last tetanus booster within 10 years. - Influenza: Refused - Pneumovax: Not applicable - Prevnar: Not applicable - COVID: Refused  PATIENT COUNSELING:   Advised to take 1 mg of folate supplement per day if capable of pregnancy.   Sexuality: Discussed sexually transmitted diseases, partner selection, use of condoms, avoidance of unintended pregnancy  and contraceptive alternatives.   Advised to avoid cigarette smoking.  I discussed with the patient that most people either abstain from alcohol or drink within safe limits (<=14/week and <=4 drinks/occasion for males, <=7/weeks and <= 3 drinks/occasion for females) and that the risk for alcohol disorders and other health effects rises proportionally with the number of drinks per week and how often a drinker exceeds daily limits.  Discussed cessation/primary prevention of drug use  and availability of treatment for abuse.   Diet: Encouraged to adjust caloric intake to maintain  or achieve ideal body weight, to reduce intake of dietary saturated fat and total fat, to limit sodium intake by avoiding high sodium foods and not adding table salt, and to maintain adequate dietary potassium and calcium preferably from fresh fruits, vegetables, and low-fat dairy products.  stressed the importance of regular exercise  Injury prevention: Discussed safety belts, safety helmets, smoke detector, smoking near bedding or upholstery.   Dental health: Discussed importance of regular tooth brushing, flossing, and dental visits.    NEXT PREVENTATIVE PHYSICAL DUE IN 1 YEAR. Return As scheduled.

## 2021-11-08 ENCOUNTER — Ambulatory Visit: Payer: BC Managed Care – PPO | Admitting: Family Medicine

## 2021-11-08 LAB — CBC WITH DIFFERENTIAL/PLATELET
Basophils Absolute: 0.1 10*3/uL (ref 0.0–0.2)
Basos: 1 %
EOS (ABSOLUTE): 0.1 10*3/uL (ref 0.0–0.4)
Eos: 1 %
Hematocrit: 39.8 % (ref 34.0–46.6)
Hemoglobin: 12.6 g/dL (ref 11.1–15.9)
Immature Grans (Abs): 0.1 10*3/uL (ref 0.0–0.1)
Immature Granulocytes: 1 %
Lymphocytes Absolute: 2.9 10*3/uL (ref 0.7–3.1)
Lymphs: 26 %
MCH: 26.5 pg — ABNORMAL LOW (ref 26.6–33.0)
MCHC: 31.7 g/dL (ref 31.5–35.7)
MCV: 84 fL (ref 79–97)
Monocytes Absolute: 0.4 10*3/uL (ref 0.1–0.9)
Monocytes: 4 %
Neutrophils Absolute: 7.5 10*3/uL — ABNORMAL HIGH (ref 1.4–7.0)
Neutrophils: 67 %
Platelets: 527 10*3/uL — ABNORMAL HIGH (ref 150–450)
RBC: 4.75 x10E6/uL (ref 3.77–5.28)
RDW: 13.3 % (ref 11.7–15.4)
WBC: 11 10*3/uL — ABNORMAL HIGH (ref 3.4–10.8)

## 2021-11-08 LAB — COMPREHENSIVE METABOLIC PANEL
ALT: 15 IU/L (ref 0–32)
AST: 15 IU/L (ref 0–40)
Albumin/Globulin Ratio: 1.4 (ref 1.2–2.2)
Albumin: 4.1 g/dL (ref 3.8–4.8)
Alkaline Phosphatase: 102 IU/L (ref 44–121)
BUN/Creatinine Ratio: 15 (ref 9–23)
BUN: 9 mg/dL (ref 6–20)
Bilirubin Total: 0.3 mg/dL (ref 0.0–1.2)
CO2: 24 mmol/L (ref 20–29)
Calcium: 9 mg/dL (ref 8.7–10.2)
Chloride: 100 mmol/L (ref 96–106)
Creatinine, Ser: 0.62 mg/dL (ref 0.57–1.00)
Globulin, Total: 2.9 g/dL (ref 1.5–4.5)
Glucose: 106 mg/dL — ABNORMAL HIGH (ref 70–99)
Potassium: 4 mmol/L (ref 3.5–5.2)
Sodium: 137 mmol/L (ref 134–144)
Total Protein: 7 g/dL (ref 6.0–8.5)
eGFR: 120 mL/min/{1.73_m2} (ref >59)

## 2021-11-08 LAB — HEPATITIS C ANTIBODY: Hep C Virus Ab: NONREACTIVE

## 2021-11-08 LAB — HIV ANTIBODY (ROUTINE TESTING W REFLEX): HIV Screen 4th Generation wRfx: NONREACTIVE

## 2021-11-08 LAB — LIPID PANEL W/O CHOL/HDL RATIO
Cholesterol, Total: 198 mg/dL (ref 100–199)
HDL: 51 mg/dL (ref >39)
LDL Chol Calc (NIH): 135 mg/dL — ABNORMAL HIGH (ref 0–99)
Triglycerides: 64 mg/dL (ref 0–149)
VLDL Cholesterol Cal: 12 mg/dL (ref 5–40)

## 2021-11-08 LAB — TSH: TSH: 1.75 u[IU]/mL (ref 0.450–4.500)

## 2021-11-20 ENCOUNTER — Ambulatory Visit: Payer: BC Managed Care – PPO | Admitting: Dietician

## 2021-11-21 ENCOUNTER — Encounter: Payer: Self-pay | Admitting: Family Medicine

## 2021-11-21 DIAGNOSIS — G932 Benign intracranial hypertension: Secondary | ICD-10-CM | POA: Diagnosis not present

## 2021-11-21 DIAGNOSIS — G43809 Other migraine, not intractable, without status migrainosus: Secondary | ICD-10-CM | POA: Diagnosis not present

## 2021-11-21 DIAGNOSIS — Z6841 Body Mass Index (BMI) 40.0 and over, adult: Secondary | ICD-10-CM | POA: Diagnosis not present

## 2021-11-22 ENCOUNTER — Encounter: Payer: Self-pay | Admitting: Family Medicine

## 2021-11-22 ENCOUNTER — Telehealth (INDEPENDENT_AMBULATORY_CARE_PROVIDER_SITE_OTHER): Payer: BC Managed Care – PPO | Admitting: Family Medicine

## 2021-11-22 DIAGNOSIS — G932 Benign intracranial hypertension: Secondary | ICD-10-CM | POA: Diagnosis not present

## 2021-11-22 NOTE — Progress Notes (Signed)
? ?There were no vitals taken for this visit.  ? ?Subjective:  ? ? Patient ID: Alexis Rangel, female    DOB: June 08, 1986, 36 y.o.   MRN: [id number] ? ?HPI: ?Alexis Rangel is a 36 y.o. female ? ?Chief Complaint  ?Patient presents with  ? Headache  ?  Patient was seen by neurology yesterday. Was prescribed tizanidine and was told she needs eye exam first before they could do lumbar puncture.   ? ?Alexis Rangel presents today for follow up after her neurology appointment yesterday. She was able to get in a bit early and was evaluated. Note from yesterday is not yet available for review. She notes that her headaches have been getting worse. She has a lot of pressure behind her eyes. This is similar to what happened before she had her therapeutic LP in the past. She was hoping to get an LP yesterday- but she needs to have an eye exam first. She is otherwise doing well. No other concerns or complaints at this time.  ? ?Relevant past medical, surgical, family and social history reviewed and updated as indicated. Interim medical history since our last visit reviewed. ?Allergies and medications reviewed and updated. ? ?Review of Systems  ?Constitutional: Negative.   ?Respiratory: Negative.    ?Cardiovascular: Negative.   ?Gastrointestinal: Negative.   ?Musculoskeletal: Negative.   ?Skin: Negative.   ?Neurological:  Positive for headaches. Negative for dizziness, tremors, seizures, syncope, facial asymmetry, speech difficulty, weakness, light-headedness and numbness.  ?Psychiatric/Behavioral: Negative.    ? ?Per HPI unless specifically indicated above ? ?   ?Objective:  ?  ?There were no vitals taken for this visit.  ?Wt Readings from Last 3 Encounters:  ?11/07/21 237 lb 12.8 oz (107.9 kg)  ?10/24/21 234 lb 6.4 oz (106.3 kg)  ?10/08/21 234 lb 12.8 oz (106.5 kg)  ?  ?Physical Exam ?Vitals and nursing note reviewed.  ?Constitutional:   ?   General: She is not in acute distress. ?   Appearance: Normal appearance. She is not  ill-appearing, toxic-appearing or diaphoretic.  ?HENT:  ?   Head: Normocephalic and atraumatic.  ?   Right Ear: External ear normal.  ?   Left Ear: External ear normal.  ?   Nose: Nose normal.  ?   Mouth/Throat:  ?   Mouth: Mucous membranes are moist.  ?   Pharynx: Oropharynx is clear.  ?Eyes:  ?   General: No scleral icterus.    ?   Right eye: No discharge.     ?   Left eye: No discharge.  ?   Conjunctiva/sclera: Conjunctivae normal.  ?   Pupils: Pupils are equal, round, and reactive to light.  ?Pulmonary:  ?   Effort: Pulmonary effort is normal. No respiratory distress.  ?   Comments: Speaking in full sentences ?Musculoskeletal:     ?   General: Normal range of motion.  ?   Cervical back: Normal range of motion.  ?Skin: ?   Coloration: Skin is not jaundiced or pale.  ?   Findings: No bruising, erythema, lesion or rash.  ?Neurological:  ?   Mental Status: She is alert and oriented to person, place, and time. Mental status is at baseline.  ?Psychiatric:     ?   Mood and Affect: Mood normal.     ?   Behavior: Behavior normal.     ?   Thought Content: Thought content normal.     ?   Judgment: Judgment normal.  ? ? ?  Results for orders placed or performed in visit on 11/07/21  ?CBC with Differential/Platelet  ?Result Value Ref Range  ? WBC 11.0 (H) 3.4 - 10.8 x10E3/uL  ? RBC 4.75 3.77 - 5.28 x10E6/uL  ? Hemoglobin 12.6 11.1 - 15.9 g/dL  ? Hematocrit 39.8 34.0 - 46.6 %  ? MCV 84 79 - 97 fL  ? MCH 26.5 (L) 26.6 - 33.0 pg  ? MCHC 31.7 31.5 - 35.7 g/dL  ? RDW 13.3 11.7 - 15.4 %  ? Platelets 527 (H) 150 - 450 x10E3/uL  ? Neutrophils 67 Not Estab. %  ? Lymphs 26 Not Estab. %  ? Monocytes 4 Not Estab. %  ? Eos 1 Not Estab. %  ? Basos 1 Not Estab. %  ? Neutrophils Absolute 7.5 (H) 1.4 - 7.0 x10E3/uL  ? Lymphocytes Absolute 2.9 0.7 - 3.1 x10E3/uL  ? Monocytes Absolute 0.4 0.1 - 0.9 x10E3/uL  ? EOS (ABSOLUTE) 0.1 0.0 - 0.4 x10E3/uL  ? Basophils Absolute 0.1 0.0 - 0.2 x10E3/uL  ? Immature Granulocytes 1 Not Estab. %  ? Immature  Grans (Abs) 0.1 0.0 - 0.1 x10E3/uL  ?Comprehensive metabolic panel  ?Result Value Ref Range  ? Glucose 106 (H) 70 - 99 mg/dL  ? BUN 9 6 - 20 mg/dL  ? Creatinine, Ser 0.62 0.57 - 1.00 mg/dL  ? eGFR 120 >59 mL/min/1.73  ? BUN/Creatinine Ratio 15 9 - 23  ? Sodium 137 134 - 144 mmol/L  ? Potassium 4.0 3.5 - 5.2 mmol/L  ? Chloride 100 96 - 106 mmol/L  ? CO2 24 20 - 29 mmol/L  ? Calcium 9.0 8.7 - 10.2 mg/dL  ? Total Protein 7.0 6.0 - 8.5 g/dL  ? Albumin 4.1 3.8 - 4.8 g/dL  ? Globulin, Total 2.9 1.5 - 4.5 g/dL  ? Albumin/Globulin Ratio 1.4 1.2 - 2.2  ? Bilirubin Total 0.3 0.0 - 1.2 mg/dL  ? Alkaline Phosphatase 102 44 - 121 IU/L  ? AST 15 0 - 40 IU/L  ? ALT 15 0 - 32 IU/L  ?Lipid Panel w/o Chol/HDL Ratio  ?Result Value Ref Range  ? Cholesterol, Total 198 100 - 199 mg/dL  ? Triglycerides 64 0 - 149 mg/dL  ? HDL 51 >39 mg/dL  ? VLDL Cholesterol Cal 12 5 - 40 mg/dL  ? LDL Chol Calc (NIH) 135 (H) 0 - 99 mg/dL  ?Urinalysis, Routine w reflex microscopic  ?Result Value Ref Range  ? Specific Gravity, UA 1.025 1.005 - 1.030  ? pH, UA 6.5 5.0 - 7.5  ? Color, UA Yellow Yellow  ? Appearance Ur Cloudy (A) Clear  ? Leukocytes,UA Negative Negative  ? Protein,UA Negative Negative/Trace  ? Glucose, UA Negative Negative  ? Ketones, UA Negative Negative  ? RBC, UA Negative Negative  ? Bilirubin, UA Negative Negative  ? Urobilinogen, Ur 0.2 0.2 - 1.0 mg/dL  ? Nitrite, UA Negative Negative  ?TSH  ?Result Value Ref Range  ? TSH 1.750 0.450 - 4.500 uIU/mL  ?HIV Antibody (routine testing w rflx)  ?Result Value Ref Range  ? HIV Screen 4th Generation wRfx Non Reactive Non Reactive  ?Hepatitis C Antibody  ?Result Value Ref Range  ? Hep C Virus Ab Non Reactive Non Reactive  ? ?   ?Assessment & Plan:  ? ?Problem List Items Addressed This Visit   ? ?  ? Nervous and Auditory  ? Pseudotumor cerebri - Primary  ?  Following with neurology- just got reestablished. Their note is not currently available  for review. They told Dessa that she needs an eye exam  before she can have an LP. We will help fasciliate that. Referral to ophthalmology made today. Continue to follow with neurology. Call with any concerns.  ? ?  ?  ? Relevant Orders  ? Ambulatory referral to Ophthalmology  ?  ? ?Follow up plan: ?Return if symptoms worsen or fail to improve. ? ? ?This visit was completed via video visit through MyChart due to the restrictions of the COVID-19 pandemic. All issues as above were discussed and addressed. Physical exam was done as above through visual confirmation on video through MyChart. If it was felt that the patient should be evaluated in the office, they were directed there. The patient verbally consented to this visit. ?Location of the patient: home ?Location of the provider: home ?Those involved with this call:  ?Provider: Park Liter, DO ?CMA: Louanna Raw, [place] ?Front Desk/Registration: [employer]  ?Time spent on call:  15 minutes with patient face to face via video conference. More than 50% of this time was spent in counseling and coordination of care. 23 minutes total spent in review of patient's record and preparation of their chart. ? ? ? ? ?

## 2021-11-22 NOTE — Assessment & Plan Note (Signed)
Following with neurology- just got reestablished. Their note is not currently available for review. They told Alexis Rangel that she needs an eye exam before she can have an LP. We will help fasciliate that. Referral to ophthalmology made today. Continue to follow with neurology. Call with any concerns.  ? ?

## 2021-11-28 DIAGNOSIS — G932 Benign intracranial hypertension: Secondary | ICD-10-CM | POA: Diagnosis not present

## 2021-12-10 ENCOUNTER — Encounter: Payer: Self-pay | Admitting: Family Medicine

## 2021-12-10 ENCOUNTER — Ambulatory Visit: Payer: BC Managed Care – PPO | Admitting: Family Medicine

## 2021-12-10 DIAGNOSIS — G43109 Migraine with aura, not intractable, without status migrainosus: Secondary | ICD-10-CM

## 2021-12-10 DIAGNOSIS — G932 Benign intracranial hypertension: Secondary | ICD-10-CM

## 2021-12-10 NOTE — Assessment & Plan Note (Signed)
Under good control on current regimen. Continue current regimen. Continue to monitor. Call with any concerns. Refills up to date. Call with any concerns.    

## 2021-12-10 NOTE — Assessment & Plan Note (Signed)
Following up with neurology tomorrow. Continue to monitor. Call with any concerns.  ?

## 2021-12-10 NOTE — Progress Notes (Signed)
? ?BP 106/69   Pulse 81   Temp 98.4 ?F (36.9 ?C)   Wt 238 lb 9.6 oz (108.2 kg)   SpO2 98%   BMI 43.64 kg/m?   ? ?Subjective:  ? ? Patient ID: Alexis Rangel, female    DOB: 27-Feb-1986, 36 y.o.   MRN: [id number] ? ?HPI: ?Alexis Rangel is a 36 y.o. female ? ?Chief Complaint  ?Patient presents with  ? Migraine  ?  Patient states she is here to follow up from 2/23 visit. Patient was put on [employer] and states it is working good for her. ?Patient states she needs her FMLA paper work re-done because her job is giving her a hard time about the restrictions that were put on there.   ? ?MIGRAINES- saw ophthalmology. They recommend serial lumbar punctures. Seeing neurology again tomorrow.  ?Duration: chronic ?Onset: sudden ?Severity: severe ?Quality: sharp and aching ?Frequency: rarely ?Location: all over ?Radiation: into her neck ?Time of day headache occurs: at random ?Headache status at time of visit: asymptomatic ?Treatments attempted: Treatments attempted: rest, ice, heat, APAP, ibuprofen, aleve", excedrine, triptans, propranolol, topamax, and amitriptyline   ?Aura: no ?Nausea:  no ?Vomiting: no ?Photophobia:  yes ?Phonophobia:  no ?Effect on social functioning:  yes ?Confusion:  no ?Gait disturbance/ataxia:  no ?Behavioral changes:  no ?Fevers:  no ? ? ?Relevant past medical, surgical, family and social history reviewed and updated as indicated. Interim medical history since our last visit reviewed. ?Allergies and medications reviewed and updated. ? ?Review of Systems  ?Constitutional: Negative.   ?Respiratory: Negative.    ?Cardiovascular: Negative.   ?Gastrointestinal: Negative.   ?Musculoskeletal: Negative.   ?Neurological: Negative.   ?Psychiatric/Behavioral: Negative.    ? ?Per HPI unless specifically indicated above ? ?   ?Objective:  ?  ?BP 106/69   Pulse 81   Temp 98.4 ?F (36.9 ?C)   Wt 238 lb 9.6 oz (108.2 kg)   SpO2 98%   BMI 43.64 kg/m?   ?Wt Readings from Last 3 Encounters:  ?12/10/21 238  lb 9.6 oz (108.2 kg)  ?11/07/21 237 lb 12.8 oz (107.9 kg)  ?10/24/21 234 lb 6.4 oz (106.3 kg)  ?  ?Physical Exam ?Vitals and nursing note reviewed.  ?Constitutional:   ?   General: She is not in acute distress. ?   Appearance: Normal appearance. She is not ill-appearing, toxic-appearing or diaphoretic.  ?HENT:  ?   Head: Normocephalic and atraumatic.  ?   Right Ear: External ear normal.  ?   Left Ear: External ear normal.  ?   Nose: Nose normal.  ?   Mouth/Throat:  ?   Mouth: Mucous membranes are moist.  ?   Pharynx: Oropharynx is clear.  ?Eyes:  ?   General: No scleral icterus.    ?   Right eye: No discharge.     ?   Left eye: No discharge.  ?   Extraocular Movements: Extraocular movements intact.  ?   Conjunctiva/sclera: Conjunctivae normal.  ?   Pupils: Pupils are equal, round, and reactive to light.  ?Cardiovascular:  ?   Rate and Rhythm: Normal rate and regular rhythm.  ?   Pulses: Normal pulses.  ?   Heart sounds: Normal heart sounds. No murmur heard. ?  No friction rub. No gallop.  ?Pulmonary:  ?   Effort: Pulmonary effort is normal. No respiratory distress.  ?   Breath sounds: Normal breath sounds. No stridor. No wheezing, rhonchi or rales.  ?Chest:  ?  Chest wall: No tenderness.  ?Musculoskeletal:     ?   General: Normal range of motion.  ?   Cervical back: Normal range of motion and neck supple.  ?Skin: ?   General: Skin is warm and dry.  ?   Capillary Refill: Capillary refill takes less than 2 seconds.  ?   Coloration: Skin is not jaundiced or pale.  ?   Findings: No bruising, erythema, lesion or rash.  ?Neurological:  ?   General: No focal deficit present.  ?   Mental Status: She is alert and oriented to person, place, and time. Mental status is at baseline.  ?Psychiatric:     ?   Mood and Affect: Mood normal.     ?   Behavior: Behavior normal.     ?   Thought Content: Thought content normal.     ?   Judgment: Judgment normal.  ? ? ?Results for orders placed or performed in visit on 11/07/21  ?CBC with  Differential/Platelet  ?Result Value Ref Range  ? WBC 11.0 (H) 3.4 - 10.8 x10E3/uL  ? RBC 4.75 3.77 - 5.28 x10E6/uL  ? Hemoglobin 12.6 11.1 - 15.9 g/dL  ? Hematocrit 39.8 34.0 - 46.6 %  ? MCV 84 79 - 97 fL  ? MCH 26.5 (L) 26.6 - 33.0 pg  ? MCHC 31.7 31.5 - 35.7 g/dL  ? RDW 13.3 11.7 - 15.4 %  ? Platelets 527 (H) 150 - 450 x10E3/uL  ? Neutrophils 67 Not Estab. %  ? Lymphs 26 Not Estab. %  ? Monocytes 4 Not Estab. %  ? Eos 1 Not Estab. %  ? Basos 1 Not Estab. %  ? Neutrophils Absolute 7.5 (H) 1.4 - 7.0 x10E3/uL  ? Lymphocytes Absolute 2.9 0.7 - 3.1 x10E3/uL  ? Monocytes Absolute 0.4 0.1 - 0.9 x10E3/uL  ? EOS (ABSOLUTE) 0.1 0.0 - 0.4 x10E3/uL  ? Basophils Absolute 0.1 0.0 - 0.2 x10E3/uL  ? Immature Granulocytes 1 Not Estab. %  ? Immature Grans (Abs) 0.1 0.0 - 0.1 x10E3/uL  ?Comprehensive metabolic panel  ?Result Value Ref Range  ? Glucose 106 (H) 70 - 99 mg/dL  ? BUN 9 6 - 20 mg/dL  ? Creatinine, Ser 0.62 0.57 - 1.00 mg/dL  ? eGFR 120 >59 mL/min/1.73  ? BUN/Creatinine Ratio 15 9 - 23  ? Sodium 137 134 - 144 mmol/L  ? Potassium 4.0 3.5 - 5.2 mmol/L  ? Chloride 100 96 - 106 mmol/L  ? CO2 24 20 - 29 mmol/L  ? Calcium 9.0 8.7 - 10.2 mg/dL  ? Total Protein 7.0 6.0 - 8.5 g/dL  ? Albumin 4.1 3.8 - 4.8 g/dL  ? Globulin, Total 2.9 1.5 - 4.5 g/dL  ? Albumin/Globulin Ratio 1.4 1.2 - 2.2  ? Bilirubin Total 0.3 0.0 - 1.2 mg/dL  ? Alkaline Phosphatase 102 44 - 121 IU/L  ? AST 15 0 - 40 IU/L  ? ALT 15 0 - 32 IU/L  ?Lipid Panel w/o Chol/HDL Ratio  ?Result Value Ref Range  ? Cholesterol, Total 198 100 - 199 mg/dL  ? Triglycerides 64 0 - 149 mg/dL  ? HDL 51 >39 mg/dL  ? VLDL Cholesterol Cal 12 5 - 40 mg/dL  ? LDL Chol Calc (NIH) 135 (H) 0 - 99 mg/dL  ?Urinalysis, Routine w reflex microscopic  ?Result Value Ref Range  ? Specific Gravity, UA 1.025 1.005 - 1.030  ? pH, UA 6.5 5.0 - 7.5  ? Color, UA Yellow Yellow  ?  Appearance Ur Cloudy (A) Clear  ? Leukocytes,UA Negative Negative  ? Protein,UA Negative Negative/Trace  ? Glucose, UA  Negative Negative  ? Ketones, UA Negative Negative  ? RBC, UA Negative Negative  ? Bilirubin, UA Negative Negative  ? Urobilinogen, Ur 0.2 0.2 - 1.0 mg/dL  ? Nitrite, UA Negative Negative  ?TSH  ?Result Value Ref Range  ? TSH 1.750 0.450 - 4.500 uIU/mL  ?HIV Antibody (routine testing w rflx)  ?Result Value Ref Range  ? HIV Screen 4th Generation wRfx Non Reactive Non Reactive  ?Hepatitis C Antibody  ?Result Value Ref Range  ? Hep C Virus Ab Non Reactive Non Reactive  ? ?   ?Assessment & Plan:  ? ?Problem List Items Addressed This Visit   ? ?  ? Cardiovascular and Mediastinum  ? Migraine  ?  Under good control on current regimen. Continue current regimen. Continue to monitor. Call with any concerns. Refills up to date. Call with any concerns.  ? ?  ?  ?  ? Nervous and Auditory  ? Pseudotumor cerebri  ?  Following up with neurology tomorrow. Continue to monitor. Call with any concerns.  ?  ?  ?  ? ?Follow up plan: ?Return June for 6 month visit. ? ? ? ? ? ?

## 2021-12-19 ENCOUNTER — Ambulatory Visit: Payer: BC Managed Care – PPO | Admitting: Family Medicine

## 2021-12-19 ENCOUNTER — Encounter: Payer: Self-pay | Admitting: Family Medicine

## 2021-12-20 ENCOUNTER — Telehealth: Payer: Self-pay | Admitting: Family Medicine

## 2021-12-20 NOTE — Telephone Encounter (Signed)
Returned patient's  call and informed her that Dr. Wynetta Emery does not do lumbar punctures and does not schedule them.  ?

## 2021-12-20 NOTE — Telephone Encounter (Signed)
Copied from CRM [id number]. Topic: General - Other ?>> Dec 20, 2021 12:01 PM Pawlus, Alexis Rangel wrote: ?Reason for CRM: Pt called in asking if Dr Laural Benes would be able to do Rangel lumbar puncture, please advise. ?

## 2021-12-23 ENCOUNTER — Ambulatory Visit: Payer: BC Managed Care – PPO | Admitting: Dietician

## 2021-12-31 DIAGNOSIS — G932 Benign intracranial hypertension: Secondary | ICD-10-CM | POA: Diagnosis not present

## 2022-01-01 NOTE — Telephone Encounter (Signed)
Alexis Rangel with Patient employer called in in reference to get some details on patients FMLA paper work question #7 need a call back at Ph# [phone] can leave a detailed message if no answer secured line.  ?

## 2022-01-02 DIAGNOSIS — R000 Tachycardia, unspecified: Secondary | ICD-10-CM | POA: Diagnosis not present

## 2022-01-02 DIAGNOSIS — R202 Paresthesia of skin: Secondary | ICD-10-CM | POA: Diagnosis not present

## 2022-01-02 DIAGNOSIS — H53149 Visual discomfort, unspecified: Secondary | ICD-10-CM | POA: Diagnosis not present

## 2022-01-02 DIAGNOSIS — G971 Other reaction to spinal and lumbar puncture: Secondary | ICD-10-CM | POA: Diagnosis not present

## 2022-01-02 DIAGNOSIS — R519 Headache, unspecified: Secondary | ICD-10-CM | POA: Diagnosis not present

## 2022-01-06 ENCOUNTER — Telehealth: Payer: Self-pay | Admitting: *Deleted

## 2022-01-06 NOTE — Telephone Encounter (Signed)
Transition Care Management Follow-up Telephone Call ?Date of discharge and from where: [facility] ?How have you been since you were released from the hospital? Still having some pain in back ?Any questions or concerns?   ?Concerns about message left for FMLA will discuss at hospital follow up ? ?Items Reviewed: ?Did the pt receive and understand the discharge instructions provided? Yes  ?Medications obtained and verified?  ?Other?  ?Any new allergies since your discharge? No  ?Dietary orders reviewed? No ?Do you have support at home? Yes  ? ?Home Care and Equipment/Supplies: ?Were home health services ordered? not applicable ?If so, what is the name of the agency?   ?Has the agency set up a time to come to the patient's home? not applicable ?Were any new equipment or medical supplies ordered?  No ?What is the name of the medical supply agency?  ?Were you able to get the supplies/equipment?  ?Do you have any questions related to the use of the equipment or supplies? No ? ?Functional Questionnaire: (I = Independent and D = Dependent) ?ADLs: I  ? ?Bathing/Dressing- I ? ?Meal Prep- I ? ?Eating- I ? ?Maintaining continence- I ? ?Transferring/Ambulation- I ? ?Managing Meds- I ? ?Follow up appointments reviewed: ? ?PCP Hospital f/u appt confirmed? Yes  Scheduled to see 01-09-2022 . ?Specialist Hospital f/u appt confirmed? No   ?Are transportation arrangements needed? NO ?If their condition worsens, is the pt aware to call PCP or go to the Emergency Dept.? Yes ?Was the patient provided with contact information for the PCP's office or ED? Yes ?Was to pt encouraged to call back with questions or concerns? Yes  ?

## 2022-01-07 DIAGNOSIS — G932 Benign intracranial hypertension: Secondary | ICD-10-CM | POA: Insufficient documentation

## 2022-01-09 ENCOUNTER — Ambulatory Visit: Payer: BC Managed Care – PPO | Admitting: Family Medicine

## 2022-01-09 ENCOUNTER — Encounter: Payer: Self-pay | Admitting: Family Medicine

## 2022-01-09 VITALS — BP 121/76 | HR 79 | Temp 98.3°F | Wt 238.0 lb

## 2022-01-09 DIAGNOSIS — G932 Benign intracranial hypertension: Secondary | ICD-10-CM

## 2022-01-09 DIAGNOSIS — G43109 Migraine with aura, not intractable, without status migrainosus: Secondary | ICD-10-CM

## 2022-01-09 MED ORDER — SEMAGLUTIDE-WEIGHT MANAGEMENT 1.7 MG/0.75ML ~~LOC~~ SOAJ: 1.7000 mg | SUBCUTANEOUS | 0 refills | Status: DC

## 2022-01-09 MED ORDER — SEMAGLUTIDE-WEIGHT MANAGEMENT 1 MG/0.5ML ~~LOC~~ SOAJ: 1.0000 mg | SUBCUTANEOUS | 0 refills | Status: DC

## 2022-01-09 MED ORDER — SEMAGLUTIDE-WEIGHT MANAGEMENT 0.5 MG/0.5ML ~~LOC~~ SOAJ: 0.5000 mg | SUBCUTANEOUS | 0 refills | Status: DC

## 2022-01-09 MED ORDER — SEMAGLUTIDE-WEIGHT MANAGEMENT 0.25 MG/0.5ML ~~LOC~~ SOAJ: 0.2500 mg | SUBCUTANEOUS | 0 refills | Status: AC

## 2022-01-09 MED ORDER — SEMAGLUTIDE-WEIGHT MANAGEMENT 2.4 MG/0.75ML ~~LOC~~ SOAJ: 2.4000 mg | SUBCUTANEOUS | 1 refills | Status: DC

## 2022-01-09 NOTE — Telephone Encounter (Signed)
PA for wegovy please. Follow up 3 months for tolerance.  ?

## 2022-01-09 NOTE — Assessment & Plan Note (Signed)
Doing well after her LP. Continue to follow with neurology. Call with any concerns.  ?

## 2022-01-09 NOTE — Assessment & Plan Note (Signed)
Will check on coverage for wegovy. If covered, will start medicine. Call with any concerns. Encouraged diet and exercise with goal of losing 1-2lbs per week.  ?

## 2022-01-09 NOTE — Assessment & Plan Note (Signed)
Doing well after her LP. Continue to follow with neurology. Call with any concerns.  ?

## 2022-01-09 NOTE — Progress Notes (Signed)
? ?BP 121/76   Pulse 79   Temp 98.3 ?F (36.8 ?C)   Wt 238 lb (108 kg)   SpO2 99%   BMI 43.53 kg/m?   ? ?Subjective:  ? ? Patient ID: Alexis Rangel, female    DOB: May 20, 1986, 36 y.o.   MRN: [id number] ? ?HPI: ?Alexis Rangel is a 36 y.o. female ? ?Chief Complaint  ?Patient presents with  ? Migraine  ?  Patient states she was seen in the ED on 01/02/22 after having a migraine for 3 days post lumbar puncture.   ? ?ER FOLLOW UP- had LP done 12/31/21 with interventional radiology, pressure was elevated  ?Time since discharge: 7 days ?Hospital/facility: [facility] ?Diagnosis: LP headache ?Procedures/tests: labs only ?Consultants: none ?New medications: compazine ?Discharge instructions:  return if not better ?Status: better ? ?WEIGHT GAIN ?Duration: chronic ?Previous attempts at weight loss: yes ?Complications of obesity: pseudotumer cererbri, hyperinsulinemia, PCOS ?Peak weight: 238 (current) ?Weight loss goal: to be healthy ?Weight loss to date: none ?Requesting obesity pharmacotherapy: yes ?Current weight loss supplements/medications: no ? ?Relevant past medical, surgical, family and social history reviewed and updated as indicated. Interim medical history since our last visit reviewed. ?Allergies and medications reviewed and updated. ? ?Review of Systems  ?Constitutional: Negative.   ?Respiratory: Negative.    ?Cardiovascular: Negative.   ?Gastrointestinal: Negative.   ?Musculoskeletal: Negative.   ?Neurological:  Positive for headaches. Negative for dizziness, tremors, seizures, syncope, facial asymmetry, speech difficulty, weakness, light-headedness and numbness.  ?Psychiatric/Behavioral: Negative.    ? ?Per HPI unless specifically indicated above ? ?   ?Objective:  ?  ?BP 121/76   Pulse 79   Temp 98.3 ?F (36.8 ?C)   Wt 238 lb (108 kg)   SpO2 99%   BMI 43.53 kg/m?   ?Wt Readings from Last 3 Encounters:  ?01/09/22 238 lb (108 kg)  ?12/10/21 238 lb 9.6 oz (108.2 kg)  ?11/07/21 237 lb 12.8 oz (107.9 kg)  ?   ?Physical Exam ?Vitals and nursing note reviewed.  ?Constitutional:   ?   General: She is not in acute distress. ?   Appearance: Normal appearance. She is obese. She is not ill-appearing, toxic-appearing or diaphoretic.  ?HENT:  ?   Head: Normocephalic and atraumatic.  ?   Right Ear: External ear normal.  ?   Left Ear: External ear normal.  ?   Nose: Nose normal.  ?   Mouth/Throat:  ?   Mouth: Mucous membranes are moist.  ?   Pharynx: Oropharynx is clear.  ?Eyes:  ?   General: No scleral icterus.    ?   Right eye: No discharge.     ?   Left eye: No discharge.  ?   Extraocular Movements: Extraocular movements intact.  ?   Conjunctiva/sclera: Conjunctivae normal.  ?   Pupils: Pupils are equal, round, and reactive to light.  ?Cardiovascular:  ?   Rate and Rhythm: Normal rate and regular rhythm.  ?   Pulses: Normal pulses.  ?   Heart sounds: Normal heart sounds. No murmur heard. ?  No friction rub. No gallop.  ?Pulmonary:  ?   Effort: Pulmonary effort is normal. No respiratory distress.  ?   Breath sounds: Normal breath sounds. No stridor. No wheezing, rhonchi or rales.  ?Chest:  ?   Chest wall: No tenderness.  ?Musculoskeletal:     ?   General: Normal range of motion.  ?   Cervical back: Normal range of motion and  neck supple.  ?Skin: ?   General: Skin is warm and dry.  ?   Capillary Refill: Capillary refill takes less than 2 seconds.  ?   Coloration: Skin is not jaundiced or pale.  ?   Findings: No bruising, erythema, lesion or rash.  ?Neurological:  ?   General: No focal deficit present.  ?   Mental Status: She is alert and oriented to person, place, and time. Mental status is at baseline.  ?Psychiatric:     ?   Mood and Affect: Mood normal.     ?   Behavior: Behavior normal.     ?   Thought Content: Thought content normal.     ?   Judgment: Judgment normal.  ? ? ?Results for orders placed or performed in visit on 11/07/21  ?CBC with Differential/Platelet  ?Result Value Ref Range  ? WBC 11.0 (H) 3.4 - 10.8 x10E3/uL   ? RBC 4.75 3.77 - 5.28 x10E6/uL  ? Hemoglobin 12.6 11.1 - 15.9 g/dL  ? Hematocrit 39.8 34.0 - 46.6 %  ? MCV 84 79 - 97 fL  ? MCH 26.5 (L) 26.6 - 33.0 pg  ? MCHC 31.7 31.5 - 35.7 g/dL  ? RDW 13.3 11.7 - 15.4 %  ? Platelets 527 (H) 150 - 450 x10E3/uL  ? Neutrophils 67 Not Estab. %  ? Lymphs 26 Not Estab. %  ? Monocytes 4 Not Estab. %  ? Eos 1 Not Estab. %  ? Basos 1 Not Estab. %  ? Neutrophils Absolute 7.5 (H) 1.4 - 7.0 x10E3/uL  ? Lymphocytes Absolute 2.9 0.7 - 3.1 x10E3/uL  ? Monocytes Absolute 0.4 0.1 - 0.9 x10E3/uL  ? EOS (ABSOLUTE) 0.1 0.0 - 0.4 x10E3/uL  ? Basophils Absolute 0.1 0.0 - 0.2 x10E3/uL  ? Immature Granulocytes 1 Not Estab. %  ? Immature Grans (Abs) 0.1 0.0 - 0.1 x10E3/uL  ?Comprehensive metabolic panel  ?Result Value Ref Range  ? Glucose 106 (H) 70 - 99 mg/dL  ? BUN 9 6 - 20 mg/dL  ? Creatinine, Ser 0.62 0.57 - 1.00 mg/dL  ? eGFR 120 >59 mL/min/1.73  ? BUN/Creatinine Ratio 15 9 - 23  ? Sodium 137 134 - 144 mmol/L  ? Potassium 4.0 3.5 - 5.2 mmol/L  ? Chloride 100 96 - 106 mmol/L  ? CO2 24 20 - 29 mmol/L  ? Calcium 9.0 8.7 - 10.2 mg/dL  ? Total Protein 7.0 6.0 - 8.5 g/dL  ? Albumin 4.1 3.8 - 4.8 g/dL  ? Globulin, Total 2.9 1.5 - 4.5 g/dL  ? Albumin/Globulin Ratio 1.4 1.2 - 2.2  ? Bilirubin Total 0.3 0.0 - 1.2 mg/dL  ? Alkaline Phosphatase 102 44 - 121 IU/L  ? AST 15 0 - 40 IU/L  ? ALT 15 0 - 32 IU/L  ?Lipid Panel w/o Chol/HDL Ratio  ?Result Value Ref Range  ? Cholesterol, Total 198 100 - 199 mg/dL  ? Triglycerides 64 0 - 149 mg/dL  ? HDL 51 >39 mg/dL  ? VLDL Cholesterol Cal 12 5 - 40 mg/dL  ? LDL Chol Calc (NIH) 135 (H) 0 - 99 mg/dL  ?Urinalysis, Routine w reflex microscopic  ?Result Value Ref Range  ? Specific Gravity, UA 1.025 1.005 - 1.030  ? pH, UA 6.5 5.0 - 7.5  ? Color, UA Yellow Yellow  ? Appearance Ur Cloudy (A) Clear  ? Leukocytes,UA Negative Negative  ? Protein,UA Negative Negative/Trace  ? Glucose, UA Negative Negative  ? Ketones, UA Negative Negative  ?  RBC, UA Negative Negative  ?  Bilirubin, UA Negative Negative  ? Urobilinogen, Ur 0.2 0.2 - 1.0 mg/dL  ? Nitrite, UA Negative Negative  ?TSH  ?Result Value Ref Range  ? TSH 1.750 0.450 - 4.500 uIU/mL  ?HIV Antibody (routine testing w rflx)  ?Result Value Ref Range  ? HIV Screen 4th Generation wRfx Non Reactive Non Reactive  ?Hepatitis C Antibody  ?Result Value Ref Range  ? Hep C Virus Ab Non Reactive Non Reactive  ? ?   ?Assessment & Plan:  ? ?Problem List Items Addressed This Visit   ? ?  ? Cardiovascular and Mediastinum  ? Migraine - Primary  ?  Doing well after her LP. Continue to follow with neurology. Call with any concerns.  ? ?  ?  ?  ? Nervous and Auditory  ? Pseudotumor cerebri  ?  Doing well after her LP. Continue to follow with neurology. Call with any concerns.  ? ?  ?  ?  ? Other  ? Morbid obesity ([place])  ?  Will check on coverage for wegovy. If covered, will start medicine. Call with any concerns. Encouraged diet and exercise with goal of losing 1-2lbs per week.  ? ?  ?  ?  ? ?Follow up plan: ?Return in about 6 months (around 07/12/2022). ? ? ? ? ? ?

## 2022-01-14 ENCOUNTER — Telehealth: Payer: Self-pay

## 2022-01-14 NOTE — Telephone Encounter (Signed)
PA initiated via CoverMyMeds for Wegovy 0.25Mg   ?Waiting on determination ?Key: [id number] ?

## 2022-01-16 NOTE — Telephone Encounter (Signed)
PA for wegovy has been approved via CoverMyMeds Will notify patient via MyChart.

## 2022-02-03 ENCOUNTER — Encounter: Payer: BC Managed Care – PPO | Attending: Certified Nurse Midwife | Admitting: Dietician

## 2022-02-03 ENCOUNTER — Encounter: Payer: Self-pay | Admitting: Dietician

## 2022-02-03 DIAGNOSIS — E282 Polycystic ovarian syndrome: Secondary | ICD-10-CM | POA: Diagnosis not present

## 2022-02-03 DIAGNOSIS — G932 Benign intracranial hypertension: Secondary | ICD-10-CM | POA: Diagnosis not present

## 2022-02-03 DIAGNOSIS — E161 Other hypoglycemia: Secondary | ICD-10-CM | POA: Diagnosis not present

## 2022-02-03 NOTE — Progress Notes (Signed)
Medical Nutrition Therapy: Visit start time: 7062  end time: 1415  Assessment:  Diagnosis: obesity Past medical history: recurring severe headaches Psychosocial issues/ stress concerns: patient reports high stress level, uses sleep and food to help, feels she is not dealing well with stress  Preferred learning method:  Auditory Hands-on   Current weight: 234.2lbs Height: 5'2" BMI: 42.84  Medications, supplements: reconciled list in medical record  Progress and evaluation:  Patient's goal is about 170lbs, was most comfortable at that weight.  She has been reducing food portions recently. Less bread She is taking  Wegovy injections to help with weight loss; has begun losing, about 5-6lbs so far. No previous weight loss diets.  Headaches have improved since beginning monthly Emgality injections.  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 Dining out frequency: 5-6 meals per week.  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 with tomato and pickles, mayo, mustard Snack: none Supper: often out -- [place], [place], [place], [place], [employer]; at home -- 6/4 sloppy joes; grilled pork chop with mac and cheese/ mashed potatoes/ corn + green beans carrots (fiance eats only a few, step son no veg) Snack: none Beverages: water, Mt Dew on breaks 8am, 11am, 2:30pm, sweet tea (states soda and sweet tea are weakness)  Intervention:   Nutrition Care Education:   Basic nutrition: basic food groups; appropriate nutrient balance; appropriate meal and snack schedule; general nutrition guidelines    Weight control: importance of low sugar and low fat choices; portion control; estimated energy needs for weight loss at 1400 kcal, provided guidance for 45% CHO, 25% pro, 30% fat;  discussed effects of sugar sweetened beverages as easy-to-consume calories even when not hungry; discussed maximizing benefit of weight loss medication by developing healthy habits that are maintained after weaning off medication. Advanced nutrition:  dining out  Additional Notes: Patient has begun making appropriate diet changes to reduce calorie intake. She is motivated to continue. Established additional goals for change with input from patient.  No follow up scheduled at this time; she will monitor progress and schedule later as needed.  Nutritional Diagnosis:  [place]-3.3 Overweight/obesity As related to history of excess calories, history of inadequate physical activity, stress.  As evidenced by patient with current BMI of 42.84, working on diet and lifestyle changes to promote weight loss.   Education Materials given:  Museum/gallery conservator with food lists, sample meal pattern Sample menus Visit summary with goals/ instructions to be viewed via patient portal   Learner/ who was taught:  Patient    Level of understanding: Verbalizes/ demonstrates competency   Demonstrated degree of understanding via:   Teach back Learning barriers: None  Willingness to learn/ readiness for change: Eager, change in progress   Monitoring and Evaluation:  Dietary intake, exercise, and body weight      follow up: prn

## 2022-02-03 NOTE — Patient Instructions (Addendum)
Reduce sugar sweetened drinks, less soda at work, and/or blend regular and diet or zero sugar. Try mixing sweet and unsweet tea to gradually reduce the sugar. Make use of healthy food choices at work, such as boiled eggs, fruit cups, yogurt parfait. Limit fried foods. Add some exercise/ activity at fitness center at work; start with a short amount of time and gradually increase as energy increases. Great job limiting extra bread and controlling food portions, keep it up!

## 2022-02-09 ENCOUNTER — Other Ambulatory Visit: Payer: Self-pay | Admitting: Family Medicine

## 2022-02-10 NOTE — Telephone Encounter (Signed)
Has new rx for each month as dose changes. Requested Prescriptions  Pending Prescriptions Disp Refills  . WEGOVY 0.25 MG/0.5ML SOAJ [Pharmacy Med Name: Wegovy 0.25 MG/0.5ML Subcutaneous Solution Auto-injector] 4 mL 0    Sig: INJECT 0.25 INTO THE SKIN ONCE A WEEK FOR 28 DAYS     Endocrinology:  Diabetes - GLP-1 Receptor Agonists - semaglutide Failed - 02/09/2022 11:11 AM      Failed - HBA1C in normal range and within 180 days    Hgb A1c MFr Bld  Date Value Ref Range Status  07/16/2021 6.0 (H) 4.8 - 5.6 % Final    Comment:             Prediabetes: 5.7 - 6.4          Diabetes: >6.4          Glycemic control for adults with diabetes: <7.0          Passed - Cr in normal range and within 360 days    Creatinine  Date Value Ref Range Status  09/04/2014 0.67 0.60 - 1.30 mg/dL Final   Creatinine, Ser  Date Value Ref Range Status  11/07/2021 0.62 0.57 - 1.00 mg/dL Final         Passed - Valid encounter within last 6 months    Recent Outpatient Visits          1 month ago Migraine with aura and without status migrainosus, not intractable   [facility] [place], Megan P, DO   2 months ago Migraine with aura and without status migrainosus, not intractable   [facility] [place], Megan P, DO   2 months ago Pseudotumor cerebri   [facility] [place], Megan P, DO   3 months ago Routine general medical examination at a health care facility   [facility], Ages P, DO   3 months ago Migraine with aura and without status migrainosus, not intractable   [place], Barb Merino, DO      Future Appointments            Tomorrow Valerie Roys, DO [place], [place]   In 5 months [place], Barb Merino, DO [employer]

## 2022-02-11 ENCOUNTER — Encounter: Payer: Self-pay | Admitting: Family Medicine

## 2022-02-11 ENCOUNTER — Ambulatory Visit: Payer: BC Managed Care – PPO | Admitting: Family Medicine

## 2022-02-11 DIAGNOSIS — Z23 Encounter for immunization: Secondary | ICD-10-CM

## 2022-02-11 DIAGNOSIS — Z8269 Family history of other diseases of the musculoskeletal system and connective tissue: Secondary | ICD-10-CM | POA: Diagnosis not present

## 2022-02-11 DIAGNOSIS — G932 Benign intracranial hypertension: Secondary | ICD-10-CM | POA: Diagnosis not present

## 2022-02-11 MED ORDER — SEMAGLUTIDE-WEIGHT MANAGEMENT 1.7 MG/0.75ML ~~LOC~~ SOAJ: 1.7000 mg | SUBCUTANEOUS | 0 refills | Status: AC

## 2022-02-11 MED ORDER — SEMAGLUTIDE-WEIGHT MANAGEMENT 0.5 MG/0.5ML ~~LOC~~ SOAJ: 0.5000 mg | SUBCUTANEOUS | 0 refills | Status: AC

## 2022-02-11 MED ORDER — SEMAGLUTIDE-WEIGHT MANAGEMENT 1 MG/0.5ML ~~LOC~~ SOAJ
1.0000 mg | SUBCUTANEOUS | 0 refills | Status: AC
Start: 2022-03-08 — End: 2022-04-05

## 2022-02-11 MED ORDER — SEMAGLUTIDE-WEIGHT MANAGEMENT 2.4 MG/0.75ML ~~LOC~~ SOAJ
2.4000 mg | SUBCUTANEOUS | 1 refills | Status: DC
Start: 2022-05-05 — End: 2022-06-05

## 2022-02-11 NOTE — Assessment & Plan Note (Signed)
Has had 1 headache since her LP. Doing well. Call with any concerns.

## 2022-02-11 NOTE — Progress Notes (Signed)
BP 109/70   Pulse 75   Temp 98.5 F (36.9 C)   Wt 231 lb 12.8 oz (105.1 kg)   SpO2 97%   BMI 42.40 kg/m    Subjective:    Patient ID: Alexis Rangel, female    DOB: Mar 29, 1986, 36 y.o.   MRN: [id number]  HPI: Alexis Rangel is a 36 y.o. female  Chief Complaint  Patient presents with   Obesity    Patient states she is currently on the .58m of Wegovy.    OBESITY Duration: chronic Previous attempts at weight loss: yes Complications of obesity: PCOS, pseudotumor cerebri, hyperinsulinemia Peak weight: 238 Weight loss goal: 175  Weight loss to date: 7lbs Requesting obesity pharmacotherapy: yes Current weight loss supplements/medications: yes Previous weight loss supplements/meds: no  Relevant past medical, surgical, family and social history reviewed and updated as indicated. Interim medical history since our last visit reviewed. Allergies and medications reviewed and updated.  Review of Systems  Constitutional: Negative.   Respiratory: Negative.    Cardiovascular: Negative.   Gastrointestinal: Negative.   Musculoskeletal: Negative.   Psychiatric/Behavioral: Negative.      Per HPI unless specifically indicated above     Objective:    BP 109/70   Pulse 75   Temp 98.5 F (36.9 C)   Wt 231 lb 12.8 oz (105.1 kg)   SpO2 97%   BMI 42.40 kg/m   Wt Readings from Last 3 Encounters:  02/11/22 231 lb 12.8 oz (105.1 kg)  02/03/22 234 lb 3.2 oz (106.2 kg)  01/09/22 238 lb (108 kg)    Physical Exam Vitals and nursing note reviewed.  Constitutional:      General: She is not in acute distress.    Appearance: Normal appearance. She is obese. She is not ill-appearing, toxic-appearing or diaphoretic.  HENT:     Head: Normocephalic and atraumatic.     Right Ear: External ear normal.     Left Ear: External ear normal.     Nose: Nose normal.     Mouth/Throat:     Mouth: Mucous membranes are moist.     Pharynx: Oropharynx is clear.  Eyes:     General: No  scleral icterus.       Right eye: No discharge.        Left eye: No discharge.     Extraocular Movements: Extraocular movements intact.     Conjunctiva/sclera: Conjunctivae normal.     Pupils: Pupils are equal, round, and reactive to light.  Cardiovascular:     Rate and Rhythm: Normal rate and regular rhythm.     Pulses: Normal pulses.     Heart sounds: Normal heart sounds. No murmur heard.    No friction rub. No gallop.  Pulmonary:     Effort: Pulmonary effort is normal. No respiratory distress.     Breath sounds: Normal breath sounds. No stridor. No wheezing, rhonchi or rales.  Chest:     Chest wall: No tenderness.  Musculoskeletal:        General: Normal range of motion.     Cervical back: Normal range of motion and neck supple.  Skin:    General: Skin is warm and dry.     Capillary Refill: Capillary refill takes less than 2 seconds.     Coloration: Skin is not jaundiced or pale.     Findings: No bruising, erythema, lesion or rash.  Neurological:     General: No focal deficit present.     Mental Status:  She is alert and oriented to person, place, and time. Mental status is at baseline.  Psychiatric:        Mood and Affect: Mood normal.        Behavior: Behavior normal.        Thought Content: Thought content normal.        Judgment: Judgment normal.     Results for orders placed or performed in visit on 11/07/21  CBC with Differential/Platelet  Result Value Ref Range   WBC 11.0 (H) 3.4 - 10.8 x10E3/uL   RBC 4.75 3.77 - 5.28 x10E6/uL   Hemoglobin 12.6 11.1 - 15.9 g/dL   Hematocrit 39.8 34.0 - 46.6 %   MCV 84 79 - 97 fL   MCH 26.5 (L) 26.6 - 33.0 pg   MCHC 31.7 31.5 - 35.7 g/dL   RDW 13.3 11.7 - 15.4 %   Platelets 527 (H) 150 - 450 x10E3/uL   Neutrophils 67 Not Estab. %   Lymphs 26 Not Estab. %   Monocytes 4 Not Estab. %   Eos 1 Not Estab. %   Basos 1 Not Estab. %   Neutrophils Absolute 7.5 (H) 1.4 - 7.0 x10E3/uL   Lymphocytes Absolute 2.9 0.7 - 3.1 x10E3/uL    Monocytes Absolute 0.4 0.1 - 0.9 x10E3/uL   EOS (ABSOLUTE) 0.1 0.0 - 0.4 x10E3/uL   Basophils Absolute 0.1 0.0 - 0.2 x10E3/uL   Immature Granulocytes 1 Not Estab. %   Immature Grans (Abs) 0.1 0.0 - 0.1 x10E3/uL  Comprehensive metabolic panel  Result Value Ref Range   Glucose 106 (H) 70 - 99 mg/dL   BUN 9 6 - 20 mg/dL   Creatinine, Ser 0.62 0.57 - 1.00 mg/dL   eGFR 120 >59 mL/min/1.73   BUN/Creatinine Ratio 15 9 - 23   Sodium 137 134 - 144 mmol/L   Potassium 4.0 3.5 - 5.2 mmol/L   Chloride 100 96 - 106 mmol/L   CO2 24 20 - 29 mmol/L   Calcium 9.0 8.7 - 10.2 mg/dL   Total Protein 7.0 6.0 - 8.5 g/dL   Albumin 4.1 3.8 - 4.8 g/dL   Globulin, Total 2.9 1.5 - 4.5 g/dL   Albumin/Globulin Ratio 1.4 1.2 - 2.2   Bilirubin Total 0.3 0.0 - 1.2 mg/dL   Alkaline Phosphatase 102 44 - 121 IU/L   AST 15 0 - 40 IU/L   ALT 15 0 - 32 IU/L  Lipid Panel w/o Chol/HDL Ratio  Result Value Ref Range   Cholesterol, Total 198 100 - 199 mg/dL   Triglycerides 64 0 - 149 mg/dL   HDL 51 >39 mg/dL   VLDL Cholesterol Cal 12 5 - 40 mg/dL   LDL Chol Calc (NIH) 135 (H) 0 - 99 mg/dL  Urinalysis, Routine w reflex microscopic  Result Value Ref Range   Specific Gravity, UA 1.025 1.005 - 1.030   pH, UA 6.5 5.0 - 7.5   Color, UA Yellow Yellow   Appearance Ur Cloudy (A) Clear   Leukocytes,UA Negative Negative   Protein,UA Negative Negative/Trace   Glucose, UA Negative Negative   Ketones, UA Negative Negative   RBC, UA Negative Negative   Bilirubin, UA Negative Negative   Urobilinogen, Ur 0.2 0.2 - 1.0 mg/dL   Nitrite, UA Negative Negative  TSH  Result Value Ref Range   TSH 1.750 0.450 - 4.500 uIU/mL  HIV Antibody (routine testing w rflx)  Result Value Ref Range   HIV Screen 4th Generation wRfx Non Reactive Non Reactive  Hepatitis C Antibody  Result Value Ref Range   Hep C Virus Ab Non Reactive Non Reactive      Assessment & Plan:   Problem List Items Addressed This Visit       Nervous and Auditory    Pseudotumor cerebri    Has had 1 headache since her LP. Doing well. Call with any concerns.        Other   Morbid obesity ([place]) - Primary    Down 7lbs since starting her wegovy. Goal of losing at least 11lbs for 5% of her body weight. Only on 0.63m so far- will recheck in 3 months. Continue titration up.      Relevant Medications   Semaglutide-Weight Management 0.5 MG/0.5ML SOAJ   Semaglutide-Weight Management 1 MG/0.5ML SOAJ (Start on 03/08/2022)   Semaglutide-Weight Management 1.7 MG/0.75ML SOAJ (Start on 04/06/2022)   Semaglutide-Weight Management 2.4 MG/0.75ML SOAJ (Start on 05/05/2022)   Other Visit Diagnoses     Family history of systemic lupus erythematosus       Would like an ANA drawn. Will check. Await results.    Relevant Orders   Antinuclear Antib (ANA)        Follow up plan: Return in about 3 months (around 05/14/2022).

## 2022-02-11 NOTE — Assessment & Plan Note (Signed)
Down 7lbs since starting her wegovy. Goal of losing at least 11lbs for 5% of her body weight. Only on 0.25mg  so far- will recheck in 3 months. Continue titration up.

## 2022-02-11 NOTE — Patient Instructions (Addendum)
[facility] Home Delivery [phone] [address]  [address] [place], [place]

## 2022-02-12 LAB — ANA: Anti Nuclear Antibody (ANA): NEGATIVE

## 2022-02-24 ENCOUNTER — Encounter: Payer: Self-pay | Admitting: Family Medicine

## 2022-03-18 ENCOUNTER — Encounter: Payer: Self-pay | Admitting: Family Medicine

## 2022-04-21 ENCOUNTER — Encounter: Payer: Self-pay | Admitting: Family Medicine

## 2022-05-15 ENCOUNTER — Ambulatory Visit: Payer: BC Managed Care – PPO | Admitting: Family Medicine

## 2022-05-19 ENCOUNTER — Ambulatory Visit: Payer: BC Managed Care – PPO | Admitting: Family Medicine

## 2022-05-20 ENCOUNTER — Ambulatory Visit: Payer: BC Managed Care – PPO | Admitting: Family Medicine

## 2022-05-21 ENCOUNTER — Telehealth: Payer: Self-pay

## 2022-05-21 NOTE — Telephone Encounter (Signed)
PA initiated via CoverMyMeds for Emgality 120MG /ML  Key: [id number]  Waiting on determination

## 2022-05-27 NOTE — Telephone Encounter (Signed)
PA approved.

## 2022-06-05 ENCOUNTER — Ambulatory Visit: Payer: BC Managed Care – PPO | Admitting: Family Medicine

## 2022-06-05 ENCOUNTER — Encounter: Payer: Self-pay | Admitting: Family Medicine

## 2022-06-05 MED ORDER — SEMAGLUTIDE-WEIGHT MANAGEMENT 1.7 MG/0.75ML ~~LOC~~ SOAJ: 1.7000 mg | SUBCUTANEOUS | 0 refills | Status: DC

## 2022-06-05 MED ORDER — SEMAGLUTIDE-WEIGHT MANAGEMENT 0.5 MG/0.5ML ~~LOC~~ SOAJ: 0.5000 mg | SUBCUTANEOUS | 0 refills | Status: AC

## 2022-06-05 MED ORDER — SEMAGLUTIDE-WEIGHT MANAGEMENT 2.4 MG/0.75ML ~~LOC~~ SOAJ: 2.4000 mg | SUBCUTANEOUS | 1 refills | Status: DC

## 2022-06-05 MED ORDER — SEMAGLUTIDE-WEIGHT MANAGEMENT 1 MG/0.5ML ~~LOC~~ SOAJ: 1.0000 mg | SUBCUTANEOUS | 0 refills | Status: AC

## 2022-06-05 MED ORDER — SEMAGLUTIDE-WEIGHT MANAGEMENT 0.25 MG/0.5ML ~~LOC~~ SOAJ: 0.2500 mg | SUBCUTANEOUS | 0 refills | Status: AC

## 2022-06-05 NOTE — Assessment & Plan Note (Signed)
Has not been able to get her wegovy in over a month. Has been maintaining her weight. Will restart and follow up in 3 months. Call with any concerns.

## 2022-06-05 NOTE — Progress Notes (Signed)
BP 117/82   Pulse 89   Temp 98.6 F (37 C)   Wt 233 lb 3.2 oz (105.8 kg)   SpO2 98%   BMI 42.65 kg/m    Subjective:    Patient ID: Alexis Rangel, female    DOB: Nov 06, 1985, 36 y.o.   MRN: [id number]  HPI: Alexis Rangel is a 36 y.o. female  Chief Complaint  Patient presents with   Obesity    Patient states she was able to start wegovy did the first two doses but since then has not been able to get medication due to shortage. Patient would like to know if there is other options.    OBESTIY Duration: chronic Previous attempts at weight loss: yes Complications of obesity: PCOS, pseudotumor cerebri, hyperinsulinemia Peak weight: 238 Weight loss goal: 175  Weight loss to date: 5lbs Requesting obesity pharmacotherapy: yes Current weight loss supplements/medications: yes Previous weight loss supplements/meds: yes  Relevant past medical, surgical, family and social history reviewed and updated as indicated. Interim medical history since our last visit reviewed. Allergies and medications reviewed and updated.  Review of Systems  Constitutional: Negative.   Respiratory: Negative.    Cardiovascular: Negative.   Gastrointestinal: Negative.   Musculoskeletal: Negative.   Psychiatric/Behavioral: Negative.      Per HPI unless specifically indicated above     Objective:    BP 117/82   Pulse 89   Temp 98.6 F (37 C)   Wt 233 lb 3.2 oz (105.8 kg)   SpO2 98%   BMI 42.65 kg/m   Wt Readings from Last 3 Encounters:  06/05/22 233 lb 3.2 oz (105.8 kg)  02/11/22 231 lb 12.8 oz (105.1 kg)  02/03/22 234 lb 3.2 oz (106.2 kg)    Physical Exam Vitals and nursing note reviewed.  Constitutional:      General: She is not in acute distress.    Appearance: Normal appearance. She is obese. She is not ill-appearing, toxic-appearing or diaphoretic.  HENT:     Head: Normocephalic and atraumatic.     Right Ear: External ear normal.     Left Ear: External ear normal.      Nose: Nose normal.     Mouth/Throat:     Mouth: Mucous membranes are moist.     Pharynx: Oropharynx is clear.  Eyes:     General: No scleral icterus.       Right eye: No discharge.        Left eye: No discharge.     Extraocular Movements: Extraocular movements intact.     Conjunctiva/sclera: Conjunctivae normal.     Pupils: Pupils are equal, round, and reactive to light.  Cardiovascular:     Rate and Rhythm: Normal rate and regular rhythm.     Pulses: Normal pulses.     Heart sounds: Normal heart sounds. No murmur heard.    No friction rub. No gallop.  Pulmonary:     Effort: Pulmonary effort is normal. No respiratory distress.     Breath sounds: Normal breath sounds. No stridor. No wheezing, rhonchi or rales.  Chest:     Chest wall: No tenderness.  Musculoskeletal:        General: Normal range of motion.     Cervical back: Normal range of motion and neck supple.  Skin:    General: Skin is warm and dry.     Capillary Refill: Capillary refill takes less than 2 seconds.     Coloration: Skin is not jaundiced or pale.  Findings: No bruising, erythema, lesion or rash.  Neurological:     General: No focal deficit present.     Mental Status: She is alert and oriented to person, place, and time. Mental status is at baseline.  Psychiatric:        Mood and Affect: Mood normal.        Behavior: Behavior normal.        Thought Content: Thought content normal.        Judgment: Judgment normal.     Results for orders placed or performed in visit on 02/11/22  Antinuclear Antib (ANA)  Result Value Ref Range   Anti Nuclear Antibody (ANA) Negative Negative      Assessment & Plan:   Problem List Items Addressed This Visit       Other   Morbid obesity ([place]) - Primary    Has not been able to get her wegovy in over a month. Has been maintaining her weight. Will restart and follow up in 3 months. Call with any concerns.       Relevant Medications   Semaglutide-Weight Management  0.25 MG/0.5ML SOAJ   Semaglutide-Weight Management 0.5 MG/0.5ML SOAJ (Start on 07/04/2022)   Semaglutide-Weight Management 1 MG/0.5ML SOAJ (Start on 08/02/2022)   Semaglutide-Weight Management 1.7 MG/0.75ML SOAJ (Start on 08/31/2022)   Semaglutide-Weight Management 2.4 MG/0.75ML SOAJ (Start on 09/29/2022)     Follow up plan: Return in about 3 months (around 09/05/2022).

## 2022-06-11 NOTE — Progress Notes (Signed)
BP 122/77   Pulse 85   Temp 99.4 F (37.4 C) (Oral)   Wt 236 lb 1.6 oz (107.1 kg)   SpO2 98%   BMI 43.18 kg/m    Subjective:    Patient ID: Alexis Rangel, female    DOB: 04-13-1986, 36 y.o.   MRN: [id number]  HPI: Alexis Rangel is a 36 y.o. female  Chief Complaint  Patient presents with   Leg Problem    LLE pt was told she has ringworm, c/o LLE pain.   RASH Duration:   day   Location: legs  Having pain that goes down into her foot.  Treated with lotrisone by the NP at her work yesterday.  Looks better on the outside but still causing her pain.    Relevant past medical, surgical, family and social history reviewed and updated as indicated. Interim medical history since our last visit reviewed. Allergies and medications reviewed and updated.  Review of Systems  Musculoskeletal:        Right leg pain  Skin:  Positive for rash.    Per HPI unless specifically indicated above     Objective:    BP 122/77   Pulse 85   Temp 99.4 F (37.4 C) (Oral)   Wt 236 lb 1.6 oz (107.1 kg)   SpO2 98%   BMI 43.18 kg/m   Wt Readings from Last 3 Encounters:  06/12/22 236 lb 1.6 oz (107.1 kg)  06/05/22 233 lb 3.2 oz (105.8 kg)  02/11/22 231 lb 12.8 oz (105.1 kg)    Physical Exam Vitals and nursing note reviewed.  Constitutional:      General: She is not in acute distress.    Appearance: Normal appearance. She is normal weight. She is not ill-appearing, toxic-appearing or diaphoretic.  HENT:     Head: Normocephalic.     Right Ear: External ear normal.     Left Ear: External ear normal.     Nose: Nose normal.     Mouth/Throat:     Mouth: Mucous membranes are moist.     Pharynx: Oropharynx is clear.  Eyes:     General:        Right eye: No discharge.        Left eye: No discharge.     Extraocular Movements: Extraocular movements intact.     Conjunctiva/sclera: Conjunctivae normal.     Pupils: Pupils are equal, round, and reactive to light.  Cardiovascular:      Rate and Rhythm: Normal rate and regular rhythm.     Heart sounds: No murmur heard. Pulmonary:     Effort: Pulmonary effort is normal. No respiratory distress.     Breath sounds: Normal breath sounds. No wheezing or rales.  Musculoskeletal:     Cervical back: Normal range of motion and neck supple.     Right lower leg: Swelling and tenderness present.  Skin:    General: Skin is warm and dry.     Capillary Refill: Capillary refill takes less than 2 seconds.       Neurological:     General: No focal deficit present.     Mental Status: She is alert and oriented to person, place, and time. Mental status is at baseline.  Psychiatric:        Mood and Affect: Mood normal.        Behavior: Behavior normal.        Thought Content: Thought content normal.  Judgment: Judgment normal.     Results for orders placed or performed in visit on 02/11/22  Antinuclear Antib (ANA)  Result Value Ref Range   Anti Nuclear Antibody (ANA) Negative Negative      Assessment & Plan:   Problem List Items Addressed This Visit   None Visit Diagnoses     Cellulitis, unspecified cellulitis site    -  Primary   Will treat with Keflex due to pain, warmth, and swelling.  Complete course of antibiotics.  Follow up if not improved.     Tinea       Complete course of lotrisone cream.    Relevant Medications   clotrimazole-betamethasone (LOTRISONE) cream   cephALEXin (KEFLEX) 500 MG capsule        Follow up plan: Return if symptoms worsen or fail to improve.

## 2022-06-12 ENCOUNTER — Encounter: Payer: Self-pay | Admitting: Nurse Practitioner

## 2022-06-12 ENCOUNTER — Ambulatory Visit: Payer: BC Managed Care – PPO | Admitting: Nurse Practitioner

## 2022-06-12 VITALS — BP 122/77 | HR 85 | Temp 99.4°F | Wt 236.1 lb

## 2022-06-12 DIAGNOSIS — B359 Dermatophytosis, unspecified: Secondary | ICD-10-CM | POA: Diagnosis not present

## 2022-06-12 DIAGNOSIS — L0390 Cellulitis, unspecified: Secondary | ICD-10-CM

## 2022-06-12 MED ORDER — CEPHALEXIN 500 MG PO CAPS: 500.0000 mg | ORAL_CAPSULE | Freq: Three times a day (TID) | ORAL | 0 refills | Status: DC

## 2022-07-15 ENCOUNTER — Other Ambulatory Visit: Payer: Self-pay | Admitting: Nurse Practitioner

## 2022-07-15 ENCOUNTER — Ambulatory Visit
Admission: RE | Admit: 2022-07-15 | Discharge: 2022-07-15 | Disposition: A | Discharge status: Home / Self Care | Payer: BC Managed Care – PPO | Source: Ambulatory Visit | Attending: Nurse Practitioner | Admitting: Nurse Practitioner

## 2022-07-15 DIAGNOSIS — M79672 Pain in left foot: Secondary | ICD-10-CM

## 2022-07-17 ENCOUNTER — Ambulatory Visit: Payer: BC Managed Care – PPO | Admitting: Family Medicine

## 2022-07-21 ENCOUNTER — Encounter: Payer: Self-pay | Admitting: Family Medicine

## 2022-09-04 ENCOUNTER — Ambulatory Visit: Payer: Self-pay | Admitting: *Deleted

## 2022-09-04 NOTE — Telephone Encounter (Signed)
Chief Complaint: vaginal spotting now, abdominal cramping. Requesting pelvic exam Symptoms: vaginal bleeding yesterday with small clots noted after wiping smaller than pea size. Right abdominal cramping , migraine headache dizziness at times. Patient has IUD and would like to have assessed due to bleeding noted and abdominal cramping Frequency: yesterday 09/03/22 Pertinent Negatives: Patient denies soaking pads or excessive bleeding. No constant abdominal pain. Does not think she is pregnant. No report feels like passing out. No fever reported Disposition: [] ED /[] Urgent Care (no appt availability in office) / [x] Appointment(In office/virtual)/ []  [place] [facility]/ [] Home Care/ [] Refused Recommended Disposition /[] [place] [facility]/ [x]  Follow-up with PCP Additional Notes:  Patient has called OBGYN for appt and no call back. Requesting appt tomorrow after 3:30 pm after work if possible to check placement of IUD. Recommended if sx worsen or persist to go to ED for evaluation. Appt already scheduled with PCP for 09/09/22. Patient requesting if PCP can do pelvic exam during appt for 09/09/22 for placement of IUD if OBGYN does not call her back. Please  advise.       Reason for Disposition  [1] Bleeding or spotting after procedure (e.g., biopsy) or pelvic examination (e.g., pap smear) AND [2] lasts > 7 days    No procedure or pelvic examination done .  Answer Assessment - Initial Assessment Questions 1. AMOUNT: "Describe the bleeding that you are having."    - SPOTTING: spotting, or pinkish / brownish mucous discharge; does not fill panty liner or pad    - MILD:  less than 1 pad / hour; less than patient's usual menstrual bleeding   - MODERATE: 1-2 pads / hour; 1 menstrual cup every 6 hours; small-medium blood clots (e.g., pea, grape, small coin)   - SEVERE: soaking 2 or more pads/hour for 2 or more hours; 1 menstrual cup every 2 hours; bleeding not contained by pads or continuous red blood  from vagina; large blood clots (e.g., golf ball, large coin)      Clots smaller than pea size.  2. ONSET: "When did the bleeding begin?" "Is it continuing now?"     Yesterday  3. MENSTRUAL PERIOD: "When was the last normal menstrual period?" "How is this different than your period?"     na 4. REGULARITY: "How regular are your periods?"     Spotting only has IUD 5. ABDOMEN PAIN: "Do you have any pain?" "How bad is the pain?"  (e.g., Scale 1-10; mild, moderate, or severe)   - MILD (1-3): doesn't interfere with normal activities, abdomen soft and not tender to touch    - MODERATE (4-7): interferes with normal activities or awakens from sleep, abdomen tender to touch    - SEVERE (8-10): excruciating pain, doubled over, unable to do any normal activities      Cramping right side area 6. PREGNANCY: "Is there any chance you are pregnant?" "When was your last menstrual period?"     na 7. BREASTFEEDING: "Are you breastfeeding?"     na 8. HORMONE MEDICINES: "Are you taking any hormone medicines, prescription or over-the-counter?" (e.g., birth control pills, estrogen)     IUD  9. BLOOD THINNER MEDICINES: "Do you take any blood thinners?" (e.g., Coumadin / warfarin, Pradaxa / dabigatran, aspirin)     No  10. CAUSE: "What do you think is causing the bleeding?" (e.g., recent gyn surgery, recent gyn procedure; known bleeding disorder, cervical cancer, polycystic ovarian disease, fibroids)   IUD placed approx 2 years .  11. HEMODYNAMIC STATUS: "Are  you weak or feeling lightheaded?" If Yes, ask: "Can you stand and walk normally?"        This am felt lightheaded 12. OTHER SYMPTOMS: "What other symptoms are you having with the bleeding?" (e.g., passed tissue, vaginal discharge, fever, menstrual-type cramps)       Vaginal spotting now, abdominal cramping , migraines dizziness  Protocols used: Vaginal Bleeding - Abnormal-A-AH

## 2022-09-05 ENCOUNTER — Encounter: Payer: Self-pay | Admitting: Emergency Medicine

## 2022-09-05 ENCOUNTER — Emergency Department
Admission: EM | Admit: 2022-09-05 | Discharge: 2022-09-05 | Disposition: A | Discharge status: Home / Self Care | Payer: BC Managed Care – PPO | Attending: Emergency Medicine | Admitting: Emergency Medicine

## 2022-09-05 ENCOUNTER — Ambulatory Visit: Payer: BC Managed Care – PPO | Admitting: Family Medicine

## 2022-09-05 ENCOUNTER — Emergency Department: Payer: BC Managed Care – PPO

## 2022-09-05 ENCOUNTER — Other Ambulatory Visit: Payer: Self-pay

## 2022-09-05 DIAGNOSIS — D72829 Elevated white blood cell count, unspecified: Secondary | ICD-10-CM | POA: Insufficient documentation

## 2022-09-05 DIAGNOSIS — R102 Pelvic and perineal pain: Secondary | ICD-10-CM | POA: Insufficient documentation

## 2022-09-05 DIAGNOSIS — Z975 Presence of (intrauterine) contraceptive device: Secondary | ICD-10-CM | POA: Insufficient documentation

## 2022-09-05 DIAGNOSIS — N939 Abnormal uterine and vaginal bleeding, unspecified: Secondary | ICD-10-CM | POA: Insufficient documentation

## 2022-09-05 LAB — COMPREHENSIVE METABOLIC PANEL
ALT: 20 U/L (ref 0–44)
AST: 16 U/L (ref 15–41)
Albumin: 3.5 g/dL (ref 3.5–5.0)
Alkaline Phosphatase: 80 U/L (ref 38–126)
Anion gap: 7 (ref 5–15)
BUN: 10 mg/dL (ref 6–20)
CO2: 24 mmol/L (ref 22–32)
Calcium: 8.2 mg/dL — ABNORMAL LOW (ref 8.9–10.3)
Chloride: 106 mmol/L (ref 98–111)
Creatinine, Ser: 0.6 mg/dL (ref 0.44–1.00)
GFR, Estimated: >60 mL/min (ref >60)
Glucose, Bld: 96 mg/dL (ref 70–99)
Potassium: 3.6 mmol/L (ref 3.5–5.1)
Sodium: 137 mmol/L (ref 135–145)
Total Bilirubin: 0.7 mg/dL (ref 0.3–1.2)
Total Protein: 7 g/dL (ref 6.5–8.1)

## 2022-09-05 LAB — CBC
HCT: 41.5 % (ref 36.0–46.0)
Hemoglobin: 12.9 g/dL (ref 12.0–15.0)
MCH: 26.4 pg (ref 26.0–34.0)
MCHC: 31.1 g/dL (ref 30.0–36.0)
MCV: 85 fL (ref 80.0–100.0)
Platelets: 461 10*3/uL — ABNORMAL HIGH (ref 150–400)
RBC: 4.88 MIL/uL (ref 3.87–5.11)
RDW: 14.3 % (ref 11.5–15.5)
WBC: 11 10*3/uL — ABNORMAL HIGH (ref 4.0–10.5)
nRBC: 0 % (ref 0.0–0.2)

## 2022-09-05 LAB — HCG, QUANTITATIVE, PREGNANCY: hCG, Beta Chain, Quant, S: <1 m[IU]/mL (ref <5)

## 2022-09-05 LAB — URINALYSIS, ROUTINE W REFLEX MICROSCOPIC
Bilirubin Urine: NEGATIVE
Glucose, UA: NEGATIVE mg/dL
Ketones, ur: NEGATIVE mg/dL
Nitrite: NEGATIVE
Protein, ur: 30 mg/dL — AB
Specific Gravity, Urine: 1.017 (ref 1.005–1.030)
pH: 6 (ref 5.0–8.0)

## 2022-09-05 LAB — LIPASE, BLOOD: Lipase: 31 U/L (ref 11–51)

## 2022-09-05 MED ORDER — ACETAMINOPHEN 500 MG PO TABS
1000.0000 mg | ORAL_TABLET | Freq: Once | ORAL | Status: AC
  Administered 2022-09-05: 1000 mg via ORAL
  Filled 2022-09-05: qty 2

## 2022-09-05 NOTE — ED Triage Notes (Signed)
Pt states vaginal bleeding and cramping, Pt states when using toilet paper she noted blood clots on it as well. Pt states this started yesterday and she has an IUD

## 2022-09-05 NOTE — Discharge Instructions (Addendum)
Call your OB/GYN to make a follow-up appointment but there is no evidence of pregnancy and your IUD does look in place.  Return to the ER if develop worsening symptoms or any other concerns You can take Tylenol 1 g every 8 hours and ibuprofen 600 every 8 hours to help with pain.   IMPRESSION: There is no evidence of ovarian torsion. IUD is seen in the endometrial cavity. Pelvic sonogram is otherwise unremarkable.

## 2022-09-05 NOTE — Telephone Encounter (Signed)
Called patient to inform her that it's best if she gets in contact with OBGYN. Patient verbalized understanding.

## 2022-09-05 NOTE — ED Provider Notes (Signed)
[facility] Provider Note    Event Date/Time   First MD Initiated Contact with Patient 09/05/22 1140     (approximate)   History   Abdominal Cramping   HPI  Alexis Rangel is a 37 y.o. female with IUD who comes in with concerns for abdominal cramping.  Patient reports little vaginal spotting over the past few days but then today started having more vaginal bleeding.  She has had an IUD for 2 years.  Denies any prior history of issues with it.  She reports being sexually active with 1 partner who they have been with for over 10 years.  Denies any concerns for STDs.  Has not taken anything to try to help with the pain. She denies any concerns for STDs.  Physical Exam   Triage Vital Signs: ED Triage Vitals  Enc Vitals Group     BP 09/05/22 0948 (!) 151/93     Pulse Rate 09/05/22 0948 98     Resp 09/05/22 0948 17     Temp 09/05/22 0948 98.5 F (36.9 C)     Temp Source 09/05/22 0948 Oral     SpO2 09/05/22 0948 97 %     Weight 09/05/22 0949 232 lb (105.2 kg)     Height 09/05/22 0949 5\' 2"  (1.575 m)     Head Circumference --      Peak Flow --      Pain Score 09/05/22 0948 8     Pain Loc --      Pain Edu? --      Excl. in [place]? --     Most recent vital signs: Vitals:   09/05/22 0948  BP: (!) 151/93  Pulse: 98  Resp: 17  Temp: 98.5 F (36.9 C)  SpO2: 97%     General: Awake, no distress.  CV:  Good peripheral perfusion.  Resp:  Normal effort.  Abd:  No distention.  Mild pelvic tenderness. Other:     ED Results / Procedures / Treatments   Labs (all labs ordered are listed, but only abnormal results are displayed) Labs Reviewed  CBC - Abnormal; Notable for the following components:      Result Value   WBC 11.0 (*)    Platelets 461 (*)    All other components within normal limits  COMPREHENSIVE METABOLIC PANEL  LIPASE, BLOOD  HCG, QUANTITATIVE, PREGNANCY  URINALYSIS, ROUTINE W REFLEX MICROSCOPIC  POC URINE PREG, ED       RADIOLOGY I have reviewed the ultrasound personally interpreted IUD is in place   PROCEDURES:  Critical Care performed: No  Procedures   MEDICATIONS ORDERED IN ED: Medications  acetaminophen (TYLENOL) tablet 1,000 mg (1,000 mg Oral Given 09/05/22 1211)     IMPRESSION / MDM / [place] / ED COURSE  I reviewed the triage vital signs and the nursing notes.   Patient's presentation is most consistent with acute presentation with potential threat to life or bodily function.   Differential includes pregnancy, IUD misplacement, torsion, ovarian cyst.  Will get ultrasound to pelvic, get pregnancy test to further evaluate.  Recommended STD testing as well as yeast, BV patient declined  Patient's urine is without evidence of UTI [place] of squamous as WBCs.  Her CBC shows slight elevation of platelets and white count similar to previous.  Her CMP shows normal kidney function lipase is normal hCG was negative  Pelvic exam is reassuring without any significant bleeding.  No bleeding greater than 1 an  hour.  Given reassuring vital signs and reassuring hemoglobin patient can be discharged home and follow-up outpatient with OB/GYN we discussed return precautions and she expressed understanding   FINAL CLINICAL IMPRESSION(S) / ED DIAGNOSES   Final diagnoses:  Vaginal bleeding  IUD (intrauterine device) in place     Rx / DC Orders   ED Discharge Orders     None        Note:  This document was prepared using Dragon voice recognition software and may include unintentional dictation errors.   Vanessa [place], MD 09/05/22 1455

## 2022-09-09 ENCOUNTER — Encounter: Payer: Self-pay | Admitting: Family Medicine

## 2022-09-09 ENCOUNTER — Ambulatory Visit: Payer: BC Managed Care – PPO | Admitting: Family Medicine

## 2022-09-09 ENCOUNTER — Encounter: Payer: Self-pay | Admitting: Obstetrics and Gynecology

## 2022-09-09 ENCOUNTER — Ambulatory Visit: Payer: BC Managed Care – PPO | Admitting: Obstetrics and Gynecology

## 2022-09-09 VITALS — BP 131/83 | HR 90 | Ht 62.0 in | Wt 241.3 lb

## 2022-09-09 VITALS — BP 117/75 | HR 98 | Temp 98.8°F | Ht 62.0 in | Wt 238.1 lb

## 2022-09-09 DIAGNOSIS — E282 Polycystic ovarian syndrome: Secondary | ICD-10-CM | POA: Diagnosis not present

## 2022-09-09 DIAGNOSIS — Z30431 Encounter for routine checking of intrauterine contraceptive device: Secondary | ICD-10-CM

## 2022-09-09 DIAGNOSIS — F3181 Bipolar II disorder: Secondary | ICD-10-CM | POA: Diagnosis not present

## 2022-09-09 DIAGNOSIS — G43109 Migraine with aura, not intractable, without status migrainosus: Secondary | ICD-10-CM

## 2022-09-09 DIAGNOSIS — R102 Pelvic and perineal pain: Secondary | ICD-10-CM

## 2022-09-09 MED ORDER — NORTRIPTYLINE HCL 25 MG PO CAPS: 25.0000 mg | ORAL_CAPSULE | Freq: Every day | ORAL | 1 refills | Status: DC

## 2022-09-09 MED ORDER — HYDROXYZINE HCL 25 MG PO TABS: 25.0000 mg | ORAL_TABLET | Freq: Three times a day (TID) | ORAL | 6 refills | Status: DC | PRN

## 2022-09-09 MED ORDER — SUMATRIPTAN SUCCINATE 100 MG PO TABS: ORAL_TABLET | ORAL | 12 refills | Status: DC

## 2022-09-09 MED ORDER — QUETIAPINE FUMARATE ER 150 MG PO TB24: 150.0000 mg | ORAL_TABLET | Freq: Every day | ORAL | 1 refills | Status: DC

## 2022-09-09 MED ORDER — IBUPROFEN 800 MG PO TABS: 800.0000 mg | ORAL_TABLET | Freq: Three times a day (TID) | ORAL | 6 refills | Status: DC | PRN

## 2022-09-09 MED ORDER — EMGALITY 120 MG/ML ~~LOC~~ SOAJ: 120.0000 mg | SUBCUTANEOUS | 12 refills | Status: DC

## 2022-09-09 NOTE — Progress Notes (Signed)
BP 117/75   Pulse 98   Temp 98.8 F (37.1 C) (Oral)   Ht 5\' 2"  (1.575 m)   Wt 238 lb 1.6 oz (108 kg)   SpO2 97%   BMI 43.55 kg/m    Subjective:    Patient ID: Alexis Rangel, female    DOB: 08-Feb-1986, 37 y.o.   MRN: [id number]  HPI: Alexis Rangel is a 37 y.o. female  Chief Complaint  Patient presents with   Obesity        OBESTIY Duration: chronic Previous attempts at weight loss: yes Complications of obesity: hyperinsulinemia, idiopathic intracranial hypertension, pseudotumor cerebri Peak weight: current (238)  Weight loss goal: to be healthy Weight loss to date: none Requesting obesity pharmacotherapy: no Current weight loss supplements/medications: no Previous weight loss supplements/meds: no  DEPRESSION Mood status: controlled Satisfied with current treatment?: yes Symptom severity: mild  Duration of current treatment : chronic Side effects: no Medication compliance: excellent compliance Psychotherapy/counseling: no  Depressed mood: no Anxious mood: no Anhedonia: no Significant weight loss or gain: no Insomnia: no  Fatigue: no Feelings of worthlessness or guilt: no Impaired concentration/indecisiveness: no Suicidal ideations: no Hopelessness: no Crying spells: no    09/09/2022    8:09 AM 06/12/2022    8:08 AM 06/05/2022   10:50 AM 02/11/2022   10:06 AM 02/03/2022    1:25 PM  Depression screen PHQ 2/9  Decreased Interest 0 0 0 1 0  Down, Depressed, Hopeless 1 0 0 1 0  PHQ - 2 Score 1 0 0 2 0  Altered sleeping 0 0 0 0   Tired, decreased energy 0 0 1 0   Change in appetite 0 0 0 1   Feeling bad or failure about yourself  0 0 0 2   Trouble concentrating 0 0 0 0   Moving slowly or fidgety/restless 0 0 0 0   Suicidal thoughts 0 0 0 0   PHQ-9 Score 1 0 1 5   Difficult doing work/chores Not difficult at all Not difficult at all  Not difficult at all      Headaches are acting up a bit more. Having headaches about 2x a month. Medicine has  been helping quite a bit. Tolerating it well. No other concerns or complaints at this time.   Relevant past medical, surgical, family and social history reviewed and updated as indicated. Interim medical history since our last visit reviewed. Allergies and medications reviewed and updated.  Review of Systems  Constitutional: Negative.   Respiratory: Negative.    Cardiovascular: Negative.   Gastrointestinal: Negative.   Musculoskeletal: Negative.   Neurological:  Positive for headaches. Negative for dizziness, tremors, seizures, syncope, facial asymmetry, speech difficulty, weakness, light-headedness and numbness.  Psychiatric/Behavioral: Negative.      Per HPI unless specifically indicated above     Objective:    BP 117/75   Pulse 98   Temp 98.8 F (37.1 C) (Oral)   Ht 5\' 2"  (1.575 m)   Wt 238 lb 1.6 oz (108 kg)   SpO2 97%   BMI 43.55 kg/m   Wt Readings from Last 3 Encounters:  09/09/22 241 lb 4.8 oz (109.5 kg)  09/09/22 238 lb 1.6 oz (108 kg)  09/05/22 232 lb (105.2 kg)    Physical Exam Vitals and nursing note reviewed.  Constitutional:      General: She is not in acute distress.    Appearance: Normal appearance. She is not ill-appearing, toxic-appearing or diaphoretic.  HENT:  Head: Normocephalic and atraumatic.     Right Ear: External ear normal.     Left Ear: External ear normal.     Nose: Nose normal.     Mouth/Throat:     Mouth: Mucous membranes are moist.     Pharynx: Oropharynx is clear.  Eyes:     General: No scleral icterus.       Right eye: No discharge.        Left eye: No discharge.     Extraocular Movements: Extraocular movements intact.     Conjunctiva/sclera: Conjunctivae normal.     Pupils: Pupils are equal, round, and reactive to light.  Cardiovascular:     Rate and Rhythm: Normal rate and regular rhythm.     Pulses: Normal pulses.     Heart sounds: Normal heart sounds. No murmur heard.    No friction rub. No gallop.  Pulmonary:      Effort: Pulmonary effort is normal. No respiratory distress.     Breath sounds: Normal breath sounds. No stridor. No wheezing, rhonchi or rales.  Chest:     Chest wall: No tenderness.  Musculoskeletal:        General: Normal range of motion.     Cervical back: Normal range of motion and neck supple.  Skin:    General: Skin is warm and dry.     Capillary Refill: Capillary refill takes less than 2 seconds.     Coloration: Skin is not jaundiced or pale.     Findings: No bruising, erythema, lesion or rash.  Neurological:     General: No focal deficit present.     Mental Status: She is alert and oriented to person, place, and time. Mental status is at baseline.  Psychiatric:        Mood and Affect: Mood normal.        Behavior: Behavior normal.        Thought Content: Thought content normal.        Judgment: Judgment normal.     Results for orders placed or performed during the hospital encounter of 09/05/22  CBC  Result Value Ref Range   WBC 11.0 (H) 4.0 - 10.5 K/uL   RBC 4.88 3.87 - 5.11 MIL/uL   Hemoglobin 12.9 12.0 - 15.0 g/dL   HCT 16.1 09.6 - 04.5 %   MCV 85.0 80.0 - 100.0 fL   MCH 26.4 26.0 - 34.0 pg   MCHC 31.1 30.0 - 36.0 g/dL   RDW 40.9 81.1 - 91.4 %   Platelets 461 (H) 150 - 400 K/uL   nRBC 0.0 0.0 - 0.2 %  Comprehensive metabolic panel  Result Value Ref Range   Sodium 137 135 - 145 mmol/L   Potassium 3.6 3.5 - 5.1 mmol/L   Chloride 106 98 - 111 mmol/L   CO2 24 22 - 32 mmol/L   Glucose, Bld 96 70 - 99 mg/dL   BUN 10 6 - 20 mg/dL   Creatinine, Ser 7.82 0.44 - 1.00 mg/dL   Calcium 8.2 (L) 8.9 - 10.3 mg/dL   Total Protein 7.0 6.5 - 8.1 g/dL   Albumin 3.5 3.5 - 5.0 g/dL   AST 16 15 - 41 U/L   ALT 20 0 - 44 U/L   Alkaline Phosphatase 80 38 - 126 U/L   Total Bilirubin 0.7 0.3 - 1.2 mg/dL   GFR, Estimated >95 >62 mL/min   Anion gap 7 5 - 15  Lipase, blood  Result Value Ref Range  Lipase 31 11 - 51 U/L  hCG, quantitative, pregnancy  Result Value Ref Range    hCG, Beta Chain, Quant, S <1 <5 mIU/mL  Urinalysis, Routine w reflex microscopic  Result Value Ref Range   Color, Urine YELLOW (A) YELLOW   APPearance HAZY (A) CLEAR   Specific Gravity, Urine 1.017 1.005 - 1.030   pH 6.0 5.0 - 8.0   Glucose, UA NEGATIVE NEGATIVE mg/dL   Hgb urine dipstick LARGE (A) NEGATIVE   Bilirubin Urine NEGATIVE NEGATIVE   Ketones, ur NEGATIVE NEGATIVE mg/dL   Protein, ur 30 (A) NEGATIVE mg/dL   Nitrite NEGATIVE NEGATIVE   Leukocytes,Ua TRACE (A) NEGATIVE   RBC / HPF 0-5 0 - 5 RBC/hpf   WBC, UA 6-10 0 - 5 WBC/hpf   Bacteria, UA RARE (A) NONE SEEN   Squamous Epithelial / HPF 6-10 0 - 5 /HPF   Mucus PRESENT       Assessment & Plan:   Problem List Items Addressed This Visit       Cardiovascular and Mediastinum   Migraine - Primary    Under good control on current regimen. Continue current regimen. Continue to monitor. Call with any concerns. Refills given. Will fill out FMLA paperwork.        Relevant Medications   ibuprofen (ADVIL) 800 MG tablet   nortriptyline (PAMELOR) 25 MG capsule   SUMAtriptan (IMITREX) 100 MG tablet   Galcanezumab-gnlm (EMGALITY) 120 MG/ML SOAJ     Other   Morbid obesity ([facility])    Considering going to weight management clinic. Does not want to start medication at this time. Continue to monitor. Call with any concerns.       Bipolar 2 disorder ([facility])    Under good control on current regimen. Continue current regimen. Continue to monitor. Call with any concerns. Refills given.          Follow up plan: Return in about 6 months (around 03/10/2023) for physcial.

## 2022-09-09 NOTE — Progress Notes (Signed)
HPI:      [name] is a 37 y.o. G0P0000 who LMP was No LMP recorded. (Menstrual status: IUD).  Subjective:   She presents today for 2 issues the first is that her typical pattern of bleeding with her IUD is to go 2 or 3 months and then have some spotting.  She has noticed that the spotting occurs a little bit more frequently after intercourse.  However in the last few weeks she had more of a heavy period with small clots and significant cramping that caused her to go home from work.  She went to the emergency department.  The ultrasound found the IUD in the correct location.  She had a negative pregnancy test.  Since that time her bleeding and cramping have resolved.  She reports that she has a [place] in place. Second issue is that she believes that she might have PCO.  She has increased androgenic symptoms including centripetal weight gain, increased dark hair growth and a history of unprotected intercourse without resultant pregnancy. (In the past -as she currently does not want to become pregnant)    Hx: The following portions of the patient's history were reviewed and updated as appropriate:             She  has a past medical history of Bipolar 1 disorder ([place]), Depression, Headache, and PCO (polycystic ovaries). She does not have any pertinent problems on file. She  has a past surgical history that includes Tonsillectomy and adenoidectomy; ccy (06/09/2014); Wisdom tooth extraction; and Cholecystectomy. Her family history includes Alzheimer's disease in her paternal grandfather; Bipolar disorder in her sister; COPD in her maternal grandmother and mother; Cancer in her mother; Heart attack in her father; Heart disease in her maternal grandfather, maternal grandmother, paternal grandfather, and paternal grandmother; Hyperlipidemia in her father; Hypertension in her father, paternal grandfather, and paternal grandmother; Migraines in her father. She  reports that she has never smoked.  She has never used smokeless tobacco. She reports current alcohol use. She reports that she does not use drugs. She has a current medication list which includes the following prescription(s): acetaminophen, fluticasone, emgality, hydroxyzine, ibuprofen, levonorgestrel, meloxicam, nortriptyline, ondansetron, phentermine, quetiapine fumarate, sumatriptan, and tizanidine. She is allergic to topamax [topiramate].       Review of Systems:  Review of Systems  Constitutional: Denied constitutional symptoms, night sweats, recent illness, fatigue, fever, insomnia and weight loss.  Eyes: Denied eye symptoms, eye pain, photophobia, vision change and visual disturbance.  Ears/Nose/Throat/Neck: Denied ear, nose, throat or neck symptoms, hearing loss, nasal discharge, sinus congestion and sore throat.  Cardiovascular: Denied cardiovascular symptoms, arrhythmia, chest pain/pressure, edema, exercise intolerance, orthopnea and palpitations.  Respiratory: Denied pulmonary symptoms, asthma, pleuritic pain, productive sputum, cough, dyspnea and wheezing.  Gastrointestinal: Denied, gastro-esophageal reflux, melena, nausea and vomiting.  Genitourinary: See HPI for additional information.  Musculoskeletal: Denied musculoskeletal symptoms, stiffness, swelling, muscle weakness and myalgia.  Dermatologic: Denied dermatology symptoms, rash and scar.  Neurologic: Denied neurology symptoms, dizziness, headache, neck pain and syncope.  Psychiatric: Denied psychiatric symptoms, anxiety and depression.  Endocrine: See HPI for additional information.   Meds:   Current Outpatient Medications on File Prior to Visit  Medication Sig Dispense Refill   acetaminophen (TYLENOL) 500 MG tablet Take 500 mg by mouth every 6 (six) hours as needed.     fluticasone (FLONASE) 50 MCG/ACT nasal spray fluticasone propionate 50 mcg/actuation nasal spray,suspension     Galcanezumab-gnlm (EMGALITY) 120 MG/ML SOAJ Inject 120 mg into  the skin  every 30 (thirty) days. 1.12 mL 12   hydrOXYzine (ATARAX) 25 MG tablet Take 1-2 tablets (25-50 mg total) by mouth 3 (three) times daily as needed. 180 tablet 6   ibuprofen (ADVIL) 800 MG tablet Take 1 tablet (800 mg total) by mouth every 8 (eight) hours as needed. 90 tablet 6   Levonorgestrel (KYLEENA) 19.5 MG IUD 1 Device by Intrauterine route once. 1 Intra Uterine Device 0   meloxicam (MOBIC) 15 MG tablet Mobic 15 mg tablet  Take 1 tablet every day by oral route as needed.  Don't use with other anti-inflammatories     nortriptyline (PAMELOR) 25 MG capsule Take 1-2 capsules (25-50 mg total) by mouth at bedtime. 90 capsule 1   ondansetron (ZOFRAN-ODT) 4 MG disintegrating tablet ondansetron 4 mg disintegrating tablet  DISSOLVE 1 TABLET IN MOUTH EVERY 8 HOURS AS NEEDED FOR NAUSEA FOR VOMITING     phentermine (ADIPEX-P) 37.5 MG tablet      QUEtiapine Fumarate (SEROQUEL XR) 150 MG 24 hr tablet Take 1 tablet (150 mg total) by mouth at bedtime. 90 tablet 1   SUMAtriptan (IMITREX) 100 MG tablet Take 1/2 tablet at onset of migraine. May repeat in 2 hours if headache persists or recurs. 9 tablet 12   tiZANidine (ZANAFLEX) 4 MG tablet Take 4 mg by mouth every 6 (six) hours as needed for muscle spasms. Take half a pill by mouth nightly.     No current facility-administered medications on file prior to visit.      Objective:     Vitals:   09/09/22 1049  BP: 131/83  Pulse: 90   Filed Weights   09/09/22 1049  Weight: 241 lb 4.8 oz (109.5 kg)                        Assessment:    G0P0000 Patient Active Problem List   Diagnosis Date Noted   Pseudotumor cerebri 03/18/2020   B12 deficiency 01/17/2019   Bipolar 2 disorder (HCC) 06/11/2017   Idiopathic intracranial hypertension 05/25/2017   Papilledema 05/01/2017   Pelvic pain in female 05/20/2016   Hypocalcemia 03/11/2016   Hyperinsulinemia 03/11/2016   PCO (polycystic ovaries)    Morbid obesity (HCC) 10/17/2015   Migraine 10/17/2015    Cholelithiasis 10/16/2015     1. Encounter for routine checking of intrauterine contraceptive device (IUD)   2. Pelvic cramping   3. PCO (polycystic ovaries)     Likely bleeding pattern with IUD normal.  Unusual menses last cycle.  Patient has signs and symptoms of PCO.   Plan:            1.  Expectant management of IUD.  If heavy menstrual bleeding with cramping continues consider removal and replacement.  Would recommend Mirena.  If spotting every 2 to 3 months-consider this a normal pattern for IUD.  2.  PCO labs -we will contact her when results return. Orders Orders Placed This Encounter  Procedures   DHEA-sulfate   FSH/LH   Glucose, fasting   TSH   Testosterone, Free, Total, SHBG   Prolactin   Insulin, random    No orders of the defined types were placed in this encounter.     F/U  Return for We will contact her with any abnormal test results. I spent 23 minutes involved in the care of this patient preparing to see the patient by obtaining and reviewing her medical history (including labs, imaging tests and prior procedures), documenting  clinical information in the electronic health record (EHR), counseling and coordinating care plans, writing and sending prescriptions, ordering tests or procedures and in direct communicating with the patient and medical staff discussing pertinent items from her history and physical exam.  Elonda Husky, M.D. 09/09/2022 11:30 AM

## 2022-09-09 NOTE — Progress Notes (Signed)
Patient presents today to discuss pelvic cramping and blood clots. She states on 09/04/22 starting to see small clots along with light pinkish blood when wiping along with pelvic cramping. Patient went to the ED over the weekend, ultrasound showed IUD in the endometrial cavity. She also states concerns regarding possible PCO, states she has trouble with weight gain and excess hair growth. No additional concerns.

## 2022-09-14 ENCOUNTER — Encounter: Payer: Self-pay | Admitting: Family Medicine

## 2022-09-14 NOTE — Assessment & Plan Note (Signed)
Under good control on current regimen. Continue current regimen. Continue to monitor. Call with any concerns. Refills given. Will fill out FMLA paperwork.

## 2022-09-14 NOTE — Assessment & Plan Note (Signed)
Considering going to weight management clinic. Does not want to start medication at this time. Continue to monitor. Call with any concerns.

## 2022-09-14 NOTE — Assessment & Plan Note (Signed)
Under good control on current regimen. Continue current regimen. Continue to monitor. Call with any concerns. Refills given.   

## 2022-09-18 ENCOUNTER — Other Ambulatory Visit: Payer: BC Managed Care – PPO

## 2022-09-18 DIAGNOSIS — E282 Polycystic ovarian syndrome: Secondary | ICD-10-CM | POA: Diagnosis not present

## 2022-09-28 LAB — DHEA-SULFATE: DHEA-SO4: 256 ug/dL (ref 57.3–279.2)

## 2022-09-28 LAB — TESTOSTERONE, FREE, TOTAL, SHBG
Sex Hormone Binding: 24 nmol/L — ABNORMAL LOW (ref 24.6–122.0)
Testosterone, Free: 2.6 pg/mL (ref 0.0–4.2)
Testosterone: 29 ng/dL (ref 8–60)

## 2022-09-28 LAB — FSH/LH
FSH: 4.5 m[IU]/mL
LH: 7.3 m[IU]/mL

## 2022-09-28 LAB — PROLACTIN: Prolactin: 11.4 ng/mL (ref 4.8–33.4)

## 2022-09-28 LAB — TSH: TSH: 1.58 u[IU]/mL (ref 0.450–4.500)

## 2022-09-28 LAB — GLUCOSE, FASTING: Glucose, Plasma: 96 mg/dL (ref 70–99)

## 2022-09-28 LAB — INSULIN, RANDOM: INSULIN: 27.5 u[IU]/mL — ABNORMAL HIGH (ref 2.6–24.9)

## 2022-10-10 ENCOUNTER — Ambulatory Visit: Payer: BC Managed Care – PPO | Admitting: Obstetrics and Gynecology

## 2022-10-10 ENCOUNTER — Encounter: Payer: Self-pay | Admitting: Obstetrics and Gynecology

## 2022-10-10 VITALS — BP 124/83 | HR 85 | Ht 62.0 in | Wt 236.0 lb

## 2022-10-10 DIAGNOSIS — E88819 Insulin resistance, unspecified: Secondary | ICD-10-CM

## 2022-10-10 DIAGNOSIS — E282 Polycystic ovarian syndrome: Secondary | ICD-10-CM | POA: Diagnosis not present

## 2022-10-10 MED ORDER — METFORMIN HCL 500 MG PO TABS: ORAL_TABLET | ORAL | 3 refills | Status: DC

## 2022-10-10 NOTE — Progress Notes (Signed)
HPI:      [name] is a 37 y.o. G0P0000 who LMP was No LMP recorded. (Menstrual status: IUD).  Subjective:   She presents today for follow-up of PCO labs.  Patient has noted that she has increased weight gain in the middle.  She also thinks that she has PCO.  She has an IUD in place and occasionally has some spotting but otherwise likes her IUD. She underwent PCO lab work. Of significant her patient states she has a lot of diabetes in her family.    Hx: The following portions of the patient's history were reviewed and updated as appropriate:             She  has a past medical history of Bipolar 1 disorder ([place]), Depression, Headache, and PCO (polycystic ovaries). She does not have any pertinent problems on file. She  has a past surgical history that includes Tonsillectomy and adenoidectomy; ccy (06/09/2014); Wisdom tooth extraction; and Cholecystectomy. Her family history includes Alzheimer's disease in her paternal grandfather; Bipolar disorder in her sister; COPD in her maternal grandmother and mother; Cancer in her mother; Heart attack in her father, maternal uncle, and paternal uncle; Heart disease in her maternal grandfather, maternal grandmother, paternal grandfather, and paternal grandmother; Hyperlipidemia in her father; Hypertension in her father, paternal grandfather, and paternal grandmother; Migraines in her father; Stroke in her paternal uncle. She  reports that she has never smoked. She has never used smokeless tobacco. She reports that she does not currently use alcohol. She reports that she does not use drugs. She has a current medication list which includes the following prescription(s): acetaminophen, fluticasone, emgality, hydroxyzine, ibuprofen, levonorgestrel, meloxicam, metformin, nortriptyline, ondansetron, phentermine, quetiapine fumarate, sumatriptan, and tizanidine. She is allergic to topamax [topiramate].       Review of Systems:  Review of  Systems  Constitutional: Denied constitutional symptoms, night sweats, recent illness, fatigue, fever, insomnia and weight loss.  Eyes: Denied eye symptoms, eye pain, photophobia, vision change and visual disturbance.  Ears/Nose/Throat/Neck: Denied ear, nose, throat or neck symptoms, hearing loss, nasal discharge, sinus congestion and sore throat.  Cardiovascular: Denied cardiovascular symptoms, arrhythmia, chest pain/pressure, edema, exercise intolerance, orthopnea and palpitations.  Respiratory: Denied pulmonary symptoms, asthma, pleuritic pain, productive sputum, cough, dyspnea and wheezing.  Gastrointestinal: Denied, gastro-esophageal reflux, melena, nausea and vomiting.  Genitourinary: Denied genitourinary symptoms including symptomatic vaginal discharge, pelvic relaxation issues, and urinary complaints.  Musculoskeletal: Denied musculoskeletal symptoms, stiffness, swelling, muscle weakness and myalgia.  Dermatologic: Denied dermatology symptoms, rash and scar.  Neurologic: Denied neurology symptoms, dizziness, headache, neck pain and syncope.  Psychiatric: Denied psychiatric symptoms, anxiety and depression.  Endocrine: Denied endocrine symptoms including hot flashes and night sweats.   Meds:   Current Outpatient Medications on File Prior to Visit  Medication Sig Dispense Refill   acetaminophen (TYLENOL) 500 MG tablet Take 500 mg by mouth every 6 (six) hours as needed.     fluticasone (FLONASE) 50 MCG/ACT nasal spray fluticasone propionate 50 mcg/actuation nasal spray,suspension     Galcanezumab-gnlm (EMGALITY) 120 MG/ML SOAJ Inject 120 mg into the skin every 30 (thirty) days. 1.12 mL 12   hydrOXYzine (ATARAX) 25 MG tablet Take 1-2 tablets (25-50 mg total) by mouth 3 (three) times daily as needed. 180 tablet 6   ibuprofen (ADVIL) 800 MG tablet Take 1 tablet (800 mg total) by mouth every 8 (eight) hours as needed. 90 tablet 6   Levonorgestrel (KYLEENA) 19.5 MG IUD 1 Device by  Intrauterine route once.  1 Intra Uterine Device 0   meloxicam (MOBIC) 15 MG tablet Mobic 15 mg tablet  Take 1 tablet every day by oral route as needed.  Don't use with other anti-inflammatories     nortriptyline (PAMELOR) 25 MG capsule Take 1-2 capsules (25-50 mg total) by mouth at bedtime. 90 capsule 1   ondansetron (ZOFRAN-ODT) 4 MG disintegrating tablet ondansetron 4 mg disintegrating tablet  DISSOLVE 1 TABLET IN MOUTH EVERY 8 HOURS AS NEEDED FOR NAUSEA FOR VOMITING     phentermine (ADIPEX-P) 37.5 MG tablet      QUEtiapine Fumarate (SEROQUEL XR) 150 MG 24 hr tablet Take 1 tablet (150 mg total) by mouth at bedtime. 90 tablet 1   SUMAtriptan (IMITREX) 100 MG tablet Take 1/2 tablet at onset of migraine. May repeat in 2 hours if headache persists or recurs. 9 tablet 12   tiZANidine (ZANAFLEX) 4 MG tablet Take 4 mg by mouth every 6 (six) hours as needed for muscle spasms. Take half a pill by mouth nightly.     No current facility-administered medications on file prior to visit.      Objective:     Vitals:   10/10/22 1055  BP: 124/83  Pulse: 85   Filed Weights   10/10/22 1055  Weight: 236 lb (107 kg)              Lab results reviewed directly with the patient          Assessment:    G0P0000 Patient Active Problem List   Diagnosis Date Noted   Pseudotumor cerebri 03/18/2020   B12 deficiency 01/17/2019   Bipolar 2 disorder ([place]) 06/11/2017   Idiopathic intracranial hypertension 05/25/2017   Papilledema 05/01/2017   Pelvic pain in female 05/20/2016   Hypocalcemia 03/11/2016   Hyperinsulinemia 03/11/2016   PCO (polycystic ovaries)    Morbid obesity ([place]) 10/17/2015   Migraine 10/17/2015   Cholelithiasis 10/16/2015     1. PCO (polycystic ovaries)   2. Insulin resistance     Patient has insulin resistance and likely a form of PCO with decreased sex hormone binding globulin as well as centripetal obesity and history of anovulation.   Plan:            1.  Keep  IUD  2.  Begin metformin ramp up to 1000 twice daily.   Discussed in detail with patient.  Will see how she is feeling in 3 months and if it has made any difference for her. Orders No orders of the defined types were placed in this encounter.    Meds ordered this encounter  Medications   metFORMIN (GLUCOPHAGE) 500 MG tablet    Sig: Take one tablet by mouth daily for one week. Then increase to one tablet twice a day for one week.  Then two tablets twice a day.    Dispense:  120 tablet    Refill:  3      F/U  Return in about 3 months (around 01/08/2023). I spent 21 minutes involved in the care of this patient preparing to see the patient by obtaining and reviewing her medical history (including labs, imaging tests and prior procedures), documenting clinical information in the electronic health record (EHR), counseling and coordinating care plans, writing and sending prescriptions, ordering tests or procedures and in direct communicating with the patient and medical staff discussing pertinent items from her history and physical exam.  Finis Bud, M.D. 10/10/2022 11:24 AM

## 2022-10-10 NOTE — Progress Notes (Signed)
Patient presents today to follow-up on recent PCO labwork. No additional concerns today.

## 2022-10-16 ENCOUNTER — Encounter: Payer: Self-pay | Admitting: Family Medicine

## 2022-10-16 ENCOUNTER — Ambulatory Visit: Payer: BC Managed Care – PPO | Admitting: Family Medicine

## 2022-10-16 VITALS — BP 134/82 | HR 97 | Temp 99.4°F | Wt 235.4 lb

## 2022-10-16 DIAGNOSIS — E282 Polycystic ovarian syndrome: Secondary | ICD-10-CM | POA: Diagnosis not present

## 2022-10-16 DIAGNOSIS — G4440 Drug-induced headache, not elsewhere classified, not intractable: Secondary | ICD-10-CM | POA: Diagnosis not present

## 2022-10-16 DIAGNOSIS — F3181 Bipolar II disorder: Secondary | ICD-10-CM

## 2022-10-16 NOTE — Assessment & Plan Note (Signed)
Having GI upset from metformin. Will stay on her metformin 1/day until she feels normal and then will increase from there. Will check A1c. Await results.

## 2022-10-16 NOTE — Progress Notes (Signed)
BP 134/82   Pulse 97   Temp 99.4 F (37.4 C) (Oral)   Wt 235 lb 6.4 oz (106.8 kg)   SpO2 99%   BMI 43.06 kg/m    Subjective:    Patient ID: Alexis Rangel, female    DOB: [birth year], 37 y.o.   MRN: [id number]  HPI: Alexis Rangel is a 37 y.o. female  Chief Complaint  Patient presents with   PCOS    Patient says she would like to discuss a prescription Glucose Meter supplies due to her insulin levels are elevated and she was informed by her OB-GYN provider. Patient says she was notified of this by provider last Thursday and was prescribed a week supply of Metformin. Patient says she is scheduled last dose should be Sunday and the medication has made her sick.    Migraine   Started on metformin for PCOS about 4 days. She has been having some loose stools and and GI upset from her metformin. She also notes that she has been a little shakey.  She has been having headaches. She has stopped sodas all together. She usually drinks 3 large caffienated drinks a day. She has been having pain in her temples. Worse with changes in position. Nothing makes her better. No other concerns or complaints at this time.   Relevant past medical, surgical, family and social history reviewed and updated as indicated. Interim medical history since our last visit reviewed. Allergies and medications reviewed and updated.  Review of Systems  Constitutional: Negative.   Respiratory: Negative.    Cardiovascular: Negative.   Gastrointestinal:  Positive for diarrhea and nausea. Negative for abdominal distention, abdominal pain, anal bleeding, blood in stool, constipation, rectal pain and vomiting.  Genitourinary: Negative.   Musculoskeletal: Negative.   Neurological:  Positive for headaches. Negative for dizziness, tremors, seizures, syncope, facial asymmetry, speech difficulty, weakness, light-headedness and numbness.  Psychiatric/Behavioral: Negative.      Per HPI unless specifically indicated  above     Objective:    BP 134/82   Pulse 97   Temp 99.4 F (37.4 C) (Oral)   Wt 235 lb 6.4 oz (106.8 kg)   SpO2 99%   BMI 43.06 kg/m   Wt Readings from Last 3 Encounters:  10/16/22 235 lb 6.4 oz (106.8 kg)  10/10/22 236 lb (107 kg)  09/09/22 241 lb 4.8 oz (109.5 kg)    Physical Exam Vitals and nursing note reviewed.  Constitutional:      General: She is not in acute distress.    Appearance: Normal appearance. She is not ill-appearing, toxic-appearing or diaphoretic.  HENT:     Head: Normocephalic and atraumatic.     Right Ear: External ear normal.     Left Ear: External ear normal.     Nose: Nose normal.     Mouth/Throat:     Mouth: Mucous membranes are moist.     Pharynx: Oropharynx is clear.  Eyes:     General: No scleral icterus.       Right eye: No discharge.        Left eye: No discharge.     Extraocular Movements: Extraocular movements intact.     Conjunctiva/sclera: Conjunctivae normal.     Pupils: Pupils are equal, round, and reactive to light.  Cardiovascular:     Rate and Rhythm: Normal rate and regular rhythm.     Pulses: Normal pulses.     Heart sounds: Normal heart sounds. No murmur heard.  No friction rub. No gallop.  Pulmonary:     Effort: Pulmonary effort is normal. No respiratory distress.     Breath sounds: Normal breath sounds. No stridor. No wheezing, rhonchi or rales.  Chest:     Chest wall: No tenderness.  Musculoskeletal:        General: Normal range of motion.     Cervical back: Normal range of motion and neck supple.  Skin:    General: Skin is warm and dry.     Capillary Refill: Capillary refill takes less than 2 seconds.     Coloration: Skin is not jaundiced or pale.     Findings: No bruising, erythema, lesion or rash.  Neurological:     General: No focal deficit present.     Mental Status: She is alert and oriented to person, place, and time. Mental status is at baseline.  Psychiatric:        Mood and Affect: Mood normal.         Behavior: Behavior normal.        Thought Content: Thought content normal.        Judgment: Judgment normal.     Results for orders placed or performed in visit on 09/09/22  DHEA-sulfate  Result Value Ref Range   DHEA-SO4 256.0 57.3 - 279.2 ug/dL  FSH/LH  Result Value Ref Range   LH 7.3 mIU/mL   FSH 4.5 mIU/mL  Glucose, fasting  Result Value Ref Range   Glucose, Plasma 96 70 - 99 mg/dL  TSH  Result Value Ref Range   TSH 1.580 0.450 - 4.500 uIU/mL  Testosterone, Free, Total, SHBG  Result Value Ref Range   Testosterone 29 8 - 60 ng/dL   Testosterone, Free 2.6 0.0 - 4.2 pg/mL   Sex Hormone Binding 24.0 (L) 24.6 - 122.0 nmol/L  Prolactin  Result Value Ref Range   Prolactin 11.4 4.8 - 33.4 ng/mL  Insulin, random  Result Value Ref Range   INSULIN 27.5 (H) 2.6 - 24.9 uIU/mL      Assessment & Plan:   Problem List Items Addressed This Visit       Endocrine   PCO (polycystic ovaries) - Primary    Having GI upset from metformin. Will stay on her metformin 1/day until she feels normal and then will increase from there. Will check A1c. Await results.       Relevant Orders   Hgb A1c w/o eAG     Other   Bipolar 2 disorder ([place])    Would like to see psychiatry. Referral generated today.       Relevant Orders   Ambulatory referral to Psychiatry   Other Visit Diagnoses     Drug-induced headache, not elsewhere classified, not intractable       Has stopped caffiene very quickly. Will start caffiene taper a little longer. Recommended that she go from  no caffiene to 2/day x1 month, 1/day x1 month & stop        Follow up plan: Return if symptoms worsen or fail to improve.

## 2022-10-16 NOTE — Assessment & Plan Note (Signed)
Would like to see psychiatry. Referral generated today.

## 2022-10-17 DIAGNOSIS — E282 Polycystic ovarian syndrome: Secondary | ICD-10-CM | POA: Insufficient documentation

## 2022-10-17 LAB — HGB A1C W/O EAG: Hgb A1c MFr Bld: 5.9 % — ABNORMAL HIGH (ref 4.8–5.6)

## 2022-10-17 NOTE — Progress Notes (Signed)
HI De your A1c is well controlled at 5.9.

## 2022-11-11 ENCOUNTER — Encounter: Payer: Self-pay | Admitting: Family Medicine

## 2022-11-11 ENCOUNTER — Ambulatory Visit: Payer: BC Managed Care – PPO | Admitting: Family Medicine

## 2022-11-11 VITALS — BP 129/77 | HR 92 | Temp 98.7°F | Ht 62.0 in | Wt 234.6 lb

## 2022-11-11 DIAGNOSIS — Z8249 Family history of ischemic heart disease and other diseases of the circulatory system: Secondary | ICD-10-CM | POA: Diagnosis not present

## 2022-11-11 DIAGNOSIS — Z0000 Encounter for general adult medical examination without abnormal findings: Secondary | ICD-10-CM | POA: Diagnosis not present

## 2022-11-11 DIAGNOSIS — E538 Deficiency of other specified B group vitamins: Secondary | ICD-10-CM

## 2022-11-11 LAB — MICROSCOPIC EXAMINATION: Bacteria, UA: NONE SEEN

## 2022-11-11 LAB — URINALYSIS, ROUTINE W REFLEX MICROSCOPIC
Bilirubin, UA: NEGATIVE
Glucose, UA: NEGATIVE
Ketones, UA: NEGATIVE
Nitrite, UA: NEGATIVE
Protein,UA: NEGATIVE
RBC, UA: NEGATIVE
Specific Gravity, UA: 1.01 (ref 1.005–1.030)
Urobilinogen, Ur: 0.2 mg/dL (ref 0.2–1.0)
pH, UA: 7 (ref 5.0–7.5)

## 2022-11-11 NOTE — Assessment & Plan Note (Signed)
Has several family members with history of MIs in their 64s- will get her into cardiology for evaluation. Referral generated today.

## 2022-11-11 NOTE — Progress Notes (Signed)
BP 129/77   Pulse 92   Temp 98.7 F (37.1 C) (Oral)   Ht '5\' 2"'$  (1.575 m)   Wt 234 lb 9.6 oz (106.4 kg)   SpO2 100%   BMI 42.91 kg/m    Subjective:    Patient ID: Alexis Rangel, female    DOB: 1986/02/26, 37 y.o.   MRN: [id number]  HPI: Alexis Rangel is a 37 y.o. female presenting on 11/11/2022 for comprehensive medical examination. Current medical complaints include:  Has 2 uncles who had heart attacks in their 75s or 108s. Dad has had 2 heart attacks in his early 52s. No family history of anyone under 40 having issues with their hearts. She has had some chest pains occasionally but none recently and usually with stress rather than with activity. She is interested in getting checked out by cardiology and is asking for a referral today.   Menopausal Symptoms: no  Depression Screen done today and results listed below:     11/11/2022    9:57 AM 10/16/2022    3:49 PM 09/09/2022    8:09 AM 06/12/2022    8:08 AM 06/05/2022   10:50 AM  Depression screen PHQ 2/9  Decreased Interest 1 2 0 0 0  Down, Depressed, Hopeless '2 1 1 '$ 0 0  PHQ - 2 Score '3 3 1 '$ 0 0  Altered sleeping 1 0 0 0 0  Tired, decreased energy 1 1 0 0 1  Change in appetite 2 1 0 0 0  Feeling bad or failure about yourself  1 2 0 0 0  Trouble concentrating 0 0 0 0 0  Moving slowly or fidgety/restless 0 0 0 0 0  Suicidal thoughts 0 0 0 0 0  PHQ-9 Score '8 7 1 '$ 0 1  Difficult doing work/chores Not difficult at all Not difficult at all Not difficult at all Not difficult at all     Past Medical History:  Past Medical History:  Diagnosis Date   Bipolar 1 disorder ([place])    Depression    Headache    PCO (polycystic ovaries)     Surgical History:  Past Surgical History:  Procedure Laterality Date   ccy  06/09/2014   pt states she does not remember this surgery   CHOLECYSTECTOMY     TONSILLECTOMY AND ADENOIDECTOMY     WISDOM TOOTH EXTRACTION      Medications:  Current Outpatient Medications on File Prior to  Visit  Medication Sig   acetaminophen (TYLENOL) 500 MG tablet Take 500 mg by mouth every 6 (six) hours as needed.   fluticasone (FLONASE) 50 MCG/ACT nasal spray fluticasone propionate 50 mcg/actuation nasal spray,suspension   Galcanezumab-gnlm (EMGALITY) 120 MG/ML SOAJ Inject 120 mg into the skin every 30 (thirty) days.   hydrOXYzine (ATARAX) 25 MG tablet Take 1-2 tablets (25-50 mg total) by mouth 3 (three) times daily as needed.   ibuprofen (ADVIL) 800 MG tablet Take 1 tablet (800 mg total) by mouth every 8 (eight) hours as needed.   Levonorgestrel (KYLEENA) 19.5 MG IUD 1 Device by Intrauterine route once.   meloxicam (MOBIC) 15 MG tablet Mobic 15 mg tablet  Take 1 tablet every day by oral route as needed.  Don't use with other anti-inflammatories   metFORMIN (GLUCOPHAGE) 500 MG tablet Take one tablet by mouth daily for one week. Then increase to one tablet twice a day for one week.  Then two tablets twice a day.   nortriptyline (PAMELOR) 25 MG capsule Take 1-2  capsules (25-50 mg total) by mouth at bedtime.   ondansetron (ZOFRAN-ODT) 4 MG disintegrating tablet ondansetron 4 mg disintegrating tablet  DISSOLVE 1 TABLET IN MOUTH EVERY 8 HOURS AS NEEDED FOR NAUSEA FOR VOMITING   phentermine (ADIPEX-P) 37.5 MG tablet    QUEtiapine Fumarate (SEROQUEL XR) 150 MG 24 hr tablet Take 1 tablet (150 mg total) by mouth at bedtime.   SUMAtriptan (IMITREX) 100 MG tablet Take 1/2 tablet at onset of migraine. May repeat in 2 hours if headache persists or recurs.   tiZANidine (ZANAFLEX) 4 MG tablet Take 4 mg by mouth every 6 (six) hours as needed for muscle spasms. Take half a pill by mouth nightly.   No current facility-administered medications on file prior to visit.    Allergies:  Allergies  Allergen Reactions   Topamax [Topiramate] Other (See Comments)    Paresthesias     Social History:  Social History   Socioeconomic History   Marital status: Single    Spouse name: Not on file   Number of  children: Not on file   Years of education: Not on file   Highest education level: Not on file  Occupational History   Not on file  Tobacco Use   Smoking status: Never   Smokeless tobacco: Never  Vaping Use   Vaping Use: Never used  Substance and Sexual Activity   Alcohol use: Not Currently    Comment: occasionally   Drug use: No   Sexual activity: Yes    Birth control/protection: I.U.D.    Comment: kyleena- inserted 2022  Other Topics Concern   Not on file  Social History Narrative   Not on file   Social Determinants of Health   Financial Resource Strain: Not on file  Food Insecurity: Not on file  Transportation Needs: Not on file  Physical Activity: Not on file  Stress: Not on file  Social Connections: Not on file  Intimate Partner Violence: Not on file   Social History   Tobacco Use  Smoking Status Never  Smokeless Tobacco Never   Social History   Substance and Sexual Activity  Alcohol Use Not Currently   Comment: occasionally    Family History:  Family History  Problem Relation Age of Onset   Cancer Mother        cervical   COPD Mother    Hyperlipidemia Father    Hypertension Father    Migraines Father    Heart attack Father    Bipolar disorder Sister    Heart attack Maternal Uncle    Heart attack Paternal Uncle    Stroke Paternal Uncle    Heart disease Maternal Grandmother    COPD Maternal Grandmother    Heart disease Maternal Grandfather    Hypertension Paternal Grandmother    Heart disease Paternal Grandmother    Hypertension Paternal Grandfather    Heart disease Paternal Grandfather    Alzheimer's disease Paternal Grandfather    Diabetes Neg Hx     Past medical history, surgical history, medications, allergies, family history and social history reviewed with patient today and changes made to appropriate areas of the chart.   Review of Systems  Constitutional: Negative.   HENT: Negative.    Eyes: Negative.   Respiratory: Negative.     Cardiovascular:  Positive for leg swelling. Negative for chest pain, palpitations, orthopnea, claudication and PND.  Gastrointestinal: Negative.   Genitourinary: Negative.   Musculoskeletal: Negative.   Skin: Negative.   Neurological: Negative.   Endo/Heme/Allergies:  Negative  for environmental allergies and polydipsia. Bruises/bleeds easily.  Psychiatric/Behavioral:  Positive for depression. Negative for hallucinations, memory loss, substance abuse and suicidal ideas. The patient is nervous/anxious. The patient does not have insomnia.    All other ROS negative except what is listed above and in the HPI.      Objective:    BP 129/77   Pulse 92   Temp 98.7 F (37.1 C) (Oral)   Ht '5\' 2"'$  (1.575 m)   Wt 234 lb 9.6 oz (106.4 kg)   SpO2 100%   BMI 42.91 kg/m   Wt Readings from Last 3 Encounters:  11/11/22 234 lb 9.6 oz (106.4 kg)  10/16/22 235 lb 6.4 oz (106.8 kg)  10/10/22 236 lb (107 kg)    Physical Exam Vitals and nursing note reviewed.  Constitutional:      General: She is not in acute distress.    Appearance: Normal appearance. She is obese. She is not ill-appearing, toxic-appearing or diaphoretic.  HENT:     Head: Normocephalic and atraumatic.     Right Ear: Tympanic membrane, ear canal and external ear normal. There is no impacted cerumen.     Left Ear: Tympanic membrane, ear canal and external ear normal. There is no impacted cerumen.     Nose: Nose normal. No congestion or rhinorrhea.     Mouth/Throat:     Mouth: Mucous membranes are moist.     Pharynx: Oropharynx is clear. No oropharyngeal exudate or posterior oropharyngeal erythema.  Eyes:     General: No scleral icterus.       Right eye: No discharge.        Left eye: No discharge.     Extraocular Movements: Extraocular movements intact.     Conjunctiva/sclera: Conjunctivae normal.     Pupils: Pupils are equal, round, and reactive to light.  Neck:     Vascular: No carotid bruit.  Cardiovascular:     Rate and  Rhythm: Normal rate and regular rhythm.     Pulses: Normal pulses.     Heart sounds: No murmur heard.    No friction rub. No gallop.  Pulmonary:     Effort: Pulmonary effort is normal. No respiratory distress.     Breath sounds: Normal breath sounds. No stridor. No wheezing, rhonchi or rales.  Chest:     Chest wall: No tenderness.  Abdominal:     General: Abdomen is flat. Bowel sounds are normal. There is no distension.     Palpations: Abdomen is soft. There is no mass.     Tenderness: There is no abdominal tenderness. There is no right CVA tenderness, left CVA tenderness, guarding or rebound.     Hernia: No hernia is present.  Genitourinary:    Comments: Breast and pelvic exams deferred with shared decision making Musculoskeletal:        General: No swelling, tenderness, deformity or signs of injury.     Cervical back: Normal range of motion and neck supple. No rigidity. No muscular tenderness.     Right lower leg: No edema.     Left lower leg: No edema.  Lymphadenopathy:     Cervical: No cervical adenopathy.  Skin:    General: Skin is warm and dry.     Capillary Refill: Capillary refill takes less than 2 seconds.     Coloration: Skin is not jaundiced or pale.     Findings: No bruising, erythema, lesion or rash.  Neurological:     General: No focal deficit present.  Mental Status: She is alert and oriented to person, place, and time. Mental status is at baseline.     Cranial Nerves: No cranial nerve deficit.     Sensory: No sensory deficit.     Motor: No weakness.     Coordination: Coordination normal.     Gait: Gait normal.     Deep Tendon Reflexes: Reflexes normal.  Psychiatric:        Mood and Affect: Mood normal.        Behavior: Behavior normal.        Thought Content: Thought content normal.        Judgment: Judgment normal.     Results for orders placed or performed in visit on 10/16/22  Hgb A1c w/o eAG  Result Value Ref Range   Hgb A1c MFr Bld 5.9 (H) 4.8 -  5.6 %      Assessment & Plan:   Problem List Items Addressed This Visit       Other   B12 deficiency    Rechecking labs today. Await results. Treat as needed.       Relevant Orders   B12   Family history of heart disease    Has several family members with history of MIs in their 34s- will get her into cardiology for evaluation. Referral generated today.      Relevant Orders   Ambulatory referral to Cardiology   Other Visit Diagnoses     Routine general medical examination at a health care facility    -  Primary   Vaccines up to date. Screening labs checked today. Pap up to date. Continue diet and exercise. Call with any concerns. Continue to monitor.   Relevant Orders   CBC with Differential/Platelet   Comprehensive metabolic panel   Lipid Panel w/o Chol/HDL Ratio   Urinalysis, Routine w reflex microscopic   TSH        Follow up plan: Return as scheduled.   LABORATORY TESTING:  - Pap smear: not applicable  IMMUNIZATIONS:   - Tdap: Tetanus vaccination status reviewed: last tetanus booster within 10 years. - Influenza: Refused - Pneumovax: Not applicable - COVID: Refused - HPV: Up to date  PATIENT COUNSELING:   Advised to take 1 mg of folate supplement per day if capable of pregnancy.   Sexuality: Discussed sexually transmitted diseases, partner selection, use of condoms, avoidance of unintended pregnancy  and contraceptive alternatives.   Advised to avoid cigarette smoking.  I discussed with the patient that most people either abstain from alcohol or drink within safe limits (<=14/week and <=4 drinks/occasion for males, <=7/weeks and <= 3 drinks/occasion for females) and that the risk for alcohol disorders and other health effects rises proportionally with the number of drinks per week and how often a drinker exceeds daily limits.  Discussed cessation/primary prevention of drug use and availability of treatment for abuse.   Diet: Encouraged to adjust  caloric intake to maintain  or achieve ideal body weight, to reduce intake of dietary saturated fat and total fat, to limit sodium intake by avoiding high sodium foods and not adding table salt, and to maintain adequate dietary potassium and calcium preferably from fresh fruits, vegetables, and low-fat dairy products.    stressed the importance of regular exercise  Injury prevention: Discussed safety belts, safety helmets, smoke detector, smoking near bedding or upholstery.   Dental health: Discussed importance of regular tooth brushing, flossing, and dental visits.    NEXT PREVENTATIVE PHYSICAL DUE IN 1 YEAR. Return  as scheduled.

## 2022-11-11 NOTE — Assessment & Plan Note (Signed)
Rechecking labs today. Await results. Treat as needed.  °

## 2022-11-12 LAB — CBC WITH DIFFERENTIAL/PLATELET
Basophils Absolute: 0.1 10*3/uL (ref 0.0–0.2)
Basos: 1 %
EOS (ABSOLUTE): 0.1 10*3/uL (ref 0.0–0.4)
Eos: 1 %
Hematocrit: 39.4 % (ref 34.0–46.6)
Hemoglobin: 12.4 g/dL (ref 11.1–15.9)
Immature Grans (Abs): 0 10*3/uL (ref 0.0–0.1)
Immature Granulocytes: 0 %
Lymphocytes Absolute: 3.4 10*3/uL — ABNORMAL HIGH (ref 0.7–3.1)
Lymphs: 29 %
MCH: 26.8 pg (ref 26.6–33.0)
MCHC: 31.5 g/dL (ref 31.5–35.7)
MCV: 85 fL (ref 79–97)
Monocytes Absolute: 0.5 10*3/uL (ref 0.1–0.9)
Monocytes: 5 %
Neutrophils Absolute: 7.4 10*3/uL — ABNORMAL HIGH (ref 1.4–7.0)
Neutrophils: 64 %
Platelets: 454 10*3/uL — ABNORMAL HIGH (ref 150–450)
RBC: 4.62 x10E6/uL (ref 3.77–5.28)
RDW: 13.7 % (ref 11.7–15.4)
WBC: 11.6 10*3/uL — ABNORMAL HIGH (ref 3.4–10.8)

## 2022-11-12 LAB — COMPREHENSIVE METABOLIC PANEL
ALT: 19 IU/L (ref 0–32)
AST: 14 IU/L (ref 0–40)
Albumin/Globulin Ratio: 1.8 (ref 1.2–2.2)
Albumin: 4.2 g/dL (ref 3.9–4.9)
Alkaline Phosphatase: 104 IU/L (ref 44–121)
BUN/Creatinine Ratio: 14 (ref 9–23)
BUN: 9 mg/dL (ref 6–20)
Bilirubin Total: <0.2 mg/dL (ref 0.0–1.2)
CO2: 22 mmol/L (ref 20–29)
Calcium: 9.1 mg/dL (ref 8.7–10.2)
Chloride: 103 mmol/L (ref 96–106)
Creatinine, Ser: 0.63 mg/dL (ref 0.57–1.00)
Globulin, Total: 2.4 g/dL (ref 1.5–4.5)
Glucose: 79 mg/dL (ref 70–99)
Potassium: 4 mmol/L (ref 3.5–5.2)
Sodium: 140 mmol/L (ref 134–144)
Total Protein: 6.6 g/dL (ref 6.0–8.5)
eGFR: 119 mL/min/{1.73_m2} (ref >59)

## 2022-11-12 LAB — LIPID PANEL W/O CHOL/HDL RATIO
Cholesterol, Total: 182 mg/dL (ref 100–199)
HDL: 45 mg/dL (ref >39)
LDL Chol Calc (NIH): 118 mg/dL — ABNORMAL HIGH (ref 0–99)
Triglycerides: 104 mg/dL (ref 0–149)
VLDL Cholesterol Cal: 19 mg/dL (ref 5–40)

## 2022-11-12 LAB — VITAMIN B12: Vitamin B-12: 151 pg/mL — ABNORMAL LOW (ref 232–1245)

## 2022-11-12 LAB — TSH: TSH: 2.39 u[IU]/mL (ref 0.450–4.500)

## 2022-11-13 ENCOUNTER — Other Ambulatory Visit: Payer: Self-pay | Admitting: Family Medicine

## 2022-11-13 DIAGNOSIS — E538 Deficiency of other specified B group vitamins: Secondary | ICD-10-CM

## 2022-11-13 MED ORDER — CYANOCOBALAMIN 1000 MCG/ML IJ SOLN
1000.0000 ug | INTRAMUSCULAR | Status: AC
  Administered 2023-01-13 – 2023-02-13 (×2): 1000 ug via INTRAMUSCULAR

## 2022-11-13 MED ORDER — CYANOCOBALAMIN 1000 MCG/ML IJ SOLN
1000.0000 ug | INTRAMUSCULAR | Status: AC
  Administered 2022-12-11: 1000 ug via INTRAMUSCULAR

## 2022-11-17 ENCOUNTER — Ambulatory Visit
Admission: EM | Admit: 2022-11-17 | Discharge: 2022-11-17 | Disposition: A | Discharge status: Home / Self Care | Payer: BC Managed Care – PPO | Attending: Family Medicine | Admitting: Family Medicine

## 2022-11-17 ENCOUNTER — Ambulatory Visit: Payer: Self-pay

## 2022-11-17 DIAGNOSIS — R519 Headache, unspecified: Secondary | ICD-10-CM

## 2022-11-17 MED ORDER — KETOROLAC TROMETHAMINE 30 MG/ML IJ SOLN
30.0000 mg | Freq: Once | INTRAMUSCULAR | Status: AC
  Administered 2022-11-17: 30 mg via INTRAMUSCULAR

## 2022-11-17 MED ORDER — METOCLOPRAMIDE HCL 5 MG/ML IJ SOLN
10.0000 mg | Freq: Once | INTRAMUSCULAR | Status: AC
  Administered 2022-11-17: 10 mg via INTRAMUSCULAR

## 2022-11-17 MED ORDER — NAPROXEN 500 MG PO TABS: 500.0000 mg | ORAL_TABLET | Freq: Two times a day (BID) | ORAL | 0 refills | Status: DC

## 2022-11-17 MED ORDER — METHOCARBAMOL 500 MG PO TABS: 500.0000 mg | ORAL_TABLET | Freq: Two times a day (BID) | ORAL | 0 refills | Status: DC

## 2022-11-17 MED ORDER — PROCHLORPERAZINE MALEATE 5 MG PO TABS: 5.0000 mg | ORAL_TABLET | Freq: Four times a day (QID) | ORAL | 0 refills | Status: DC | PRN

## 2022-11-17 NOTE — ED Provider Notes (Signed)
[facility]    CSN: [id number] Arrival date & time: 11/17/22  1055      History   Chief Complaint Chief Complaint  Patient presents with   Headache    HPI Alexis Rangel is a 37 y.o. female.   HPI   HEADACHE   Headache started on Friday while at work in the heat.  The head pain has gotten worse over the weekend. Feels like pressure in front of her head that radiates to neck and right shoulder. Has some ringing in her ears.  She had to get a lumbar puncture in [facility]. She had to get 5 so far. Last one was 12/31/21 with 27 H20 height.   Her current headache feels like her typical migraines. Heat is a known trigger.  She missed work yesterday and left work today due to pain. She took her fiances muscle relaxers which helped some but woke with headache this morning. She tried to make an appointment with her doctor but was not able to be seen until tomorrow    Associated Symptoms Nausea/vomiting: no  Photophobia/phonophobia: yes, photophobia  Tearing of eyes: no  Sinus pain/pressure: no  Personal stressors: no  Relation to menstrual cycle: no  Fever: no  Neck pain/stiffness: right sided tightness  Vision/speech/swallow/hearing difficulty: no  Focal weakness/numbness: no  Altered mental status: no  Trauma: no  New type of headache: no  Anticoagulant use: no  H/o cancer/HIV/Pregnancy: no       Past Medical History:  Diagnosis Date   Bipolar 1 disorder ([place])    Depression    Headache    PCO (polycystic ovaries)     Patient Active Problem List   Diagnosis Date Noted   Family history of heart disease 11/11/2022   Pseudotumor cerebri 03/18/2020   B12 deficiency 01/17/2019   Bipolar 2 disorder ([place]) 06/11/2017   Idiopathic intracranial hypertension 05/25/2017   Papilledema 05/01/2017   Pelvic pain in female 05/20/2016   Hypocalcemia 03/11/2016   Hyperinsulinemia 03/11/2016   PCO (polycystic ovaries)    Morbid obesity ([place]) 10/17/2015    Migraine 10/17/2015   Cholelithiasis 10/16/2015    Past Surgical History:  Procedure Laterality Date   ccy  06/09/2014   pt states she does not remember this surgery   CHOLECYSTECTOMY     TONSILLECTOMY AND ADENOIDECTOMY     WISDOM TOOTH EXTRACTION      OB History     Gravida  0   Para  0   Term  0   Preterm  0   AB  0   Living  0      SAB  0   IAB  0   Ectopic  0   Multiple  0   Live Births               Home Medications    Prior to Admission medications   Medication Sig Start Date End Date Taking? Authorizing Provider  acetaminophen (TYLENOL) 500 MG tablet Take 500 mg by mouth every 6 (six) hours as needed.   Yes [provider]  fluticasone (FLONASE) 50 MCG/ACT nasal spray fluticasone propionate 50 mcg/actuation nasal spray,suspension   Yes [provider]  Galcanezumab-gnlm (EMGALITY) 120 MG/ML SOAJ Inject 120 mg into the skin every 30 (thirty) days. 09/09/22  Yes Johnson, Megan P, DO  hydrOXYzine (ATARAX) 25 MG tablet Take 1-2 tablets (25-50 mg total) by mouth 3 (three) times daily as needed. 09/09/22  Yes Johnson, [place], DO  ibuprofen (ADVIL) 800 MG tablet Take 1 tablet (800 mg total) by mouth every 8 (eight) hours as needed. 09/09/22  Yes Johnson, Megan P, DO  Levonorgestrel (KYLEENA) 19.5 MG IUD 1 Device by Intrauterine route once. 01/18/16  Yes Shambley, Melody N, CNM  metFORMIN (GLUCOPHAGE) 500 MG tablet Take one tablet by mouth daily for one week. Then increase to one tablet twice a day for one week.  Then two tablets twice a day. 10/10/22  Yes Harlin Heys, MD  methocarbamol (ROBAXIN) 500 MG tablet Take 1 tablet (500 mg total) by mouth 2 (two) times daily. 11/17/22  Yes Felipe Paluch, DO  naproxen (NAPROSYN) 500 MG tablet Take 1 tablet (500 mg total) by mouth 2 (two) times daily with a meal. 11/17/22  Yes Tierany Appleby, DO  nortriptyline (PAMELOR) 25 MG capsule Take 1-2 capsules (25-50 mg total) by mouth at bedtime. 09/09/22  Yes  Johnson, Megan P, DO  ondansetron (ZOFRAN-ODT) 4 MG disintegrating tablet ondansetron 4 mg disintegrating tablet  DISSOLVE 1 TABLET IN MOUTH EVERY 8 HOURS AS NEEDED FOR NAUSEA FOR VOMITING   Yes [provider]  phentermine (ADIPEX-P) 37.5 MG tablet  11/28/21  Yes [provider]  prochlorperazine (COMPAZINE) 5 MG tablet Take 1 tablet (5 mg total) by mouth every 6 (six) hours as needed for nausea or vomiting. 11/17/22  Yes Ladajah Soltys, DO  QUEtiapine Fumarate (SEROQUEL XR) 150 MG 24 hr tablet Take 1 tablet (150 mg total) by mouth at bedtime. 09/09/22  Yes Johnson, Megan P, DO  SUMAtriptan (IMITREX) 100 MG tablet Take 1/2 tablet at onset of migraine. May repeat in 2 hours if headache persists or recurs. 09/09/22  Yes Johnson, Megan P, DO  tiZANidine (ZANAFLEX) 4 MG tablet Take 4 mg by mouth every 6 (six) hours as needed for muscle spasms. Take half a pill by mouth nightly.   Yes [provider]    Family History Family History  Problem Relation Age of Onset   Cancer Mother        cervical   COPD Mother    Hyperlipidemia Father    Hypertension Father    Migraines Father    Heart attack Father    Bipolar disorder Sister    Heart attack Maternal Uncle    Heart attack Paternal Uncle    Stroke Paternal Uncle    Heart disease Maternal Grandmother    COPD Maternal Grandmother    Heart disease Maternal Grandfather    Hypertension Paternal Grandmother    Heart disease Paternal Grandmother    Hypertension Paternal Grandfather    Heart disease Paternal Grandfather    Alzheimer's disease Paternal Grandfather    Diabetes Neg Hx     Social History Social History   Tobacco Use   Smoking status: Never   Smokeless tobacco: Never  Vaping Use   Vaping Use: Never used  Substance Use Topics   Alcohol use: Not Currently    Comment: occasionally   Drug use: No     Allergies   Topamax [topiramate]   Review of Systems Review of Systems: negative unless otherwise  stated in HPI.      Physical Exam Triage Vital Signs ED Triage Vitals  Enc Vitals Group     BP      Pulse      Resp      Temp      Temp src      SpO2      Weight  Height      Head Circumference      Peak Flow      Pain Score      Pain Loc      Pain Edu?      Excl. in [place]?    No data found.  Updated Vital Signs BP 117/62 (BP Location: Left Arm)   Pulse 86   Temp 98.7 F (37.1 C) (Oral)   Resp 16   Ht 5\' 2"  (1.575 m)   Wt 105.2 kg   SpO2 100%   BMI 42.43 kg/m   Visual Acuity Right Eye Distance:   Left Eye Distance:   Bilateral Distance:    Right Eye Near:   Left Eye Near:    Bilateral Near:     Physical Exam GEN:  uncomfortable appearing female, alert CV: regular rate and rhythm RESP: no increased work of breathing, clear to ascultation bilaterally MSK: normal gait, no edema  SKIN: warm, dry, no rash on visible skin NEURO:  alert, oriented, speech normal, CN 2-12 grossly intact, no facial droop,  sensation grossly intact, strength 5/5 bilateral UE and LE, normal coordination, normal finger to nose, Negative Romberg  PSYCH: Normal affect, appropriate speech and behavior     UC Treatments / Results  Labs (all labs ordered are listed, but only abnormal results are displayed) Labs Reviewed - No data to display  EKG   Radiology No results found.  Procedures Procedures (including critical care time)  Medications Ordered in UC Medications  metoCLOPramide (REGLAN) injection 10 mg (10 mg Intramuscular Given 11/17/22 1158)  ketorolac (TORADOL) 30 MG/ML injection 30 mg (30 mg Intramuscular Given 11/17/22 1158)    Initial Impression / Assessment and Plan / UC Course  I have reviewed the triage vital signs and the nursing notes.  Pertinent labs & imaging results that were available during my care of the patient were reviewed by me and considered in my medical decision making (see chart for details).       Patient is a 37 year old female with  history of migraines and idiopathic intracranial hypertension who presents for headache for the past 4 days.  Differential diagnosis includes but is not limited to migraine headache, tension headache, cluster headache, CVA, depression, intracranial hemorrhage, meningitis/encephalitis, giant cell arteritis, idiopathic intracranial hypertension, dehydration and analgesia abuse.  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  Vital signs are stable.  Overall, patient is uncomfortable-appearing, well-hydrated and in no acute distress.  Cardiopulmonary and neuro exams are normal.  Patient given Toradol 30 mg IM and Reglan 10 mg mg IM. Pt to follow up with her neurologist and ophthalmologist to discuss possible shunt as previously recommended. Strict ED precautions given.   Treat with headache cocktail  (muscle relaxer, Benadryl, antiemetic and NSAID) for mixed headache type at home. ED and return precautions reviewed. Advised to follow up with PCP.   Discussed MDM, treatment plan and plan for follow-up with patient who agrees with plan.     Final Clinical Impressions(s) / UC Diagnoses   Final diagnoses:  Mixed headache     Discharge Instructions      For you headache:  Follow up with your headache provider to speak about headaches as you may another therapeutic lumbar puncture.    Take 1 tablet benadryl, 1 tablet of compazine, 2 tablets of Tylenol with 500 mg Naprosyn and a muscle relaxer (Robaxin) for  your headache. Stop by the pharmacy to pick up your prescriptions.  After this trial if your headache does not improve, go to the emergency department.       ED Prescriptions     Medication Sig Dispense Auth. Provider   methocarbamol (ROBAXIN) 500 MG tablet Take 1 tablet (500 mg total) by mouth 2 (two) times daily. 20 tablet Lynzy Rawles, DO   naproxen (NAPROSYN) 500 MG tablet  Take 1 tablet (500 mg total) by mouth 2 (two) times daily with a meal. 30 tablet Mateo Overbeck, DO   prochlorperazine (COMPAZINE) 5 MG tablet Take 1 tablet (5 mg total) by mouth every 6 (six) hours as needed for nausea or vomiting. 30 tablet Lyndee Hensen, DO      PDMP not reviewed this encounter.   Lyndee Hensen, DO 11/19/22 1113

## 2022-11-17 NOTE — Discharge Instructions (Signed)
For you headache:  Follow up with your headache provider to speak about headaches as you may another therapeutic lumbar puncture.    Take 1 tablet benadryl, 1 tablet of compazine, 2 tablets of Tylenol with 500 mg Naprosyn and a muscle relaxer (Robaxin) for your headache. Stop by the pharmacy to pick up your prescriptions.  After this trial if your headache does not improve, go to the emergency department.

## 2022-11-17 NOTE — Telephone Encounter (Signed)
Patient states she has already went to urgent care.

## 2022-11-17 NOTE — ED Triage Notes (Signed)
Pt c/o HA,blurry vision & ringing in ears x4 days, hx of migraines. Had lumbar puncture back in Oct which usually helps her sx's.

## 2022-11-17 NOTE — Telephone Encounter (Signed)
    Chief Complaint: Migraine x 4 days. No availability today. Refuses UC. Symptoms: Pain front and top of head Frequency: 4 days ago Pertinent Negatives: Patient denies  Disposition: [] ED /[] Urgent Care (no appt availability in office) / [] Appointment(In office/virtual)/ []  [facility]/ [] Home Care/ [x] Refused Recommended Disposition /[] [facility]/ []  Follow-up with PCP Additional Notes: Please advise pt.   Reason for Disposition  [1] SEVERE headache (e.g., excruciating) AND [2] not improved after 2 hours of pain medicine  Answer Assessment - Initial Assessment Questions 1. LOCATION: "Where does it hurt?"      Front at nose 2. ONSET: "When did the headache start?" (Minutes, hours or days)      Friday 3. PATTERN: "Does the pain come and go, or has it been constant since it started?"     Constant 4. SEVERITY: "How bad is the pain?" and "What does it keep you from doing?"  (e.g., Scale 1-10; mild, moderate, or severe)   - MILD (1-3): doesn't interfere with normal activities    - MODERATE (4-7): interferes with normal activities or awakens from sleep    - SEVERE (8-10): excruciating pain, unable to do any normal activities        8 5. RECURRENT SYMPTOM: "Have you ever had headaches before?" If Yes, ask: "When was the last time?" and "What happened that time?"      Yes 6. CAUSE: "What do you think is causing the headache?"     Migraine 7. MIGRAINE: "Have you been diagnosed with migraine headaches?" If Yes, ask: "Is this headache similar?"      Yes 8. HEAD INJURY: "Has there been any recent injury to the head?"      No 9. OTHER SYMPTOMS: "Do you have any other symptoms?" (fever, stiff neck, eye pain, sore throat, cold symptoms)     No 10. PREGNANCY: "Is there any chance you are pregnant?" "When was your last menstrual period?"       NO  Protocols used: Headache-A-AH

## 2022-11-17 NOTE — Telephone Encounter (Signed)
I can see her at 1 if she can get here. OK to double book

## 2022-11-20 DIAGNOSIS — F3181 Bipolar II disorder: Secondary | ICD-10-CM | POA: Diagnosis not present

## 2022-12-03 DIAGNOSIS — F3181 Bipolar II disorder: Secondary | ICD-10-CM | POA: Diagnosis not present

## 2022-12-11 ENCOUNTER — Ambulatory Visit: Payer: BC Managed Care – PPO | Admitting: Family Medicine

## 2022-12-11 ENCOUNTER — Encounter: Payer: Self-pay | Admitting: Family Medicine

## 2022-12-11 VITALS — BP 127/81 | HR 99 | Temp 99.0°F | Wt 233.2 lb

## 2022-12-11 DIAGNOSIS — F3181 Bipolar II disorder: Secondary | ICD-10-CM | POA: Diagnosis not present

## 2022-12-11 DIAGNOSIS — E538 Deficiency of other specified B group vitamins: Secondary | ICD-10-CM

## 2022-12-11 NOTE — Progress Notes (Signed)
BP 127/81   Pulse 99   Temp 99 F (37.2 C) (Oral)   Wt 233 lb 3.2 oz (105.8 kg)   SpO2 98%   BMI 42.65 kg/m    Subjective:    Patient ID: Alexis Rangel, female    DOB: [birth year], 37 y.o.   MRN: [id number]  HPI: Alexis Rangel is a 37 y.o. female  Chief Complaint  Patient presents with   Psychiatrist    Patient would like to discuss having another referral placed for a different Psychiatrist as she was not happy with the current provider.    DEPRESSION- was not happy with her psychiatrist, would like to see someone else. Has an appointment with psychology tomorrow.  Mood status: exacerbated Satisfied with current treatment?: no Symptom severity: moderate  Duration of current treatment :  couple of weeks Side effects: no Medication compliance: good compliance Psychotherapy/counseling: yes current Previous psychiatric medications: seroquel, latuda Depressed mood: yes Anxious mood: yes Anhedonia: no Significant weight loss or gain: no Insomnia: no  Fatigue: yes Feelings of worthlessness or guilt: no Impaired concentration/indecisiveness: no Suicidal ideations: no Hopelessness: no Crying spells: no    12/11/2022    9:09 AM 11/11/2022    9:57 AM 10/16/2022    3:49 PM 09/09/2022    8:09 AM 06/12/2022    8:08 AM  Depression screen PHQ 2/9  Decreased Interest 2 1 2  0 0  Down, Depressed, Hopeless 3 2 1 1  0  PHQ - 2 Score 5 3 3 1  0  Altered sleeping 2 1 0 0 0  Tired, decreased energy 1 1 1  0 0  Change in appetite 3 2 1  0 0  Feeling bad or failure about yourself  2 1 2  0 0  Trouble concentrating 1 0 0 0 0  Moving slowly or fidgety/restless 0 0 0 0 0  Suicidal thoughts 0 0 0 0 0  PHQ-9 Score 14 8 7 1  0  Difficult doing work/chores Not difficult at all Not difficult at all Not difficult at all Not difficult at all Not difficult at all    Relevant past medical, surgical, family and social history reviewed and updated as indicated. Interim medical history since  our last visit reviewed. Allergies and medications reviewed and updated.  Review of Systems  Constitutional: Negative.   Respiratory: Negative.    Cardiovascular: Negative.   Gastrointestinal: Negative.   Musculoskeletal: Negative.   Skin: Negative.   Neurological: Negative.   Psychiatric/Behavioral:  Positive for dysphoric mood. Negative for agitation, behavioral problems, confusion, decreased concentration, hallucinations, self-injury, sleep disturbance and suicidal ideas. The patient is nervous/anxious. The patient is not hyperactive.     Per HPI unless specifically indicated above     Objective:    BP 127/81   Pulse 99   Temp 99 F (37.2 C) (Oral)   Wt 233 lb 3.2 oz (105.8 kg)   SpO2 98%   BMI 42.65 kg/m   Wt Readings from Last 3 Encounters:  12/11/22 233 lb 3.2 oz (105.8 kg)  11/17/22 232 lb (105.2 kg)  11/11/22 234 lb 9.6 oz (106.4 kg)    Physical Exam Vitals and nursing note reviewed.  Constitutional:      General: She is not in acute distress.    Appearance: Normal appearance. She is obese. She is not ill-appearing, toxic-appearing or diaphoretic.  HENT:     Head: Normocephalic and atraumatic.     Right Ear: External ear normal.     Left Ear: External  ear normal.     Nose: Nose normal.     Mouth/Throat:     Mouth: Mucous membranes are moist.     Pharynx: Oropharynx is clear.  Eyes:     General: No scleral icterus.       Right eye: No discharge.        Left eye: No discharge.     Extraocular Movements: Extraocular movements intact.     Conjunctiva/sclera: Conjunctivae normal.     Pupils: Pupils are equal, round, and reactive to light.  Cardiovascular:     Rate and Rhythm: Normal rate and regular rhythm.     Pulses: Normal pulses.     Heart sounds: Normal heart sounds. No murmur heard.    No friction rub. No gallop.  Pulmonary:     Effort: Pulmonary effort is normal. No respiratory distress.     Breath sounds: Normal breath sounds. No stridor. No  wheezing, rhonchi or rales.  Chest:     Chest wall: No tenderness.  Musculoskeletal:        General: Normal range of motion.     Cervical back: Normal range of motion and neck supple.  Skin:    General: Skin is warm and dry.     Capillary Refill: Capillary refill takes less than 2 seconds.     Coloration: Skin is not jaundiced or pale.     Findings: No bruising, erythema, lesion or rash.  Neurological:     General: No focal deficit present.     Mental Status: She is alert and oriented to person, place, and time. Mental status is at baseline.  Psychiatric:        Mood and Affect: Mood normal.        Behavior: Behavior normal.        Thought Content: Thought content normal.        Judgment: Judgment normal.     Results for orders placed or performed in visit on 11/11/22  Microscopic Examination   Urine  Result Value Ref Range   WBC, UA 0-5 0 - 5 /hpf   RBC, Urine 0-2 0 - 2 /hpf   Epithelial Cells (non renal) 0-10 0 - 10 /hpf   Bacteria, UA None seen None seen/Few  CBC with Differential/Platelet  Result Value Ref Range   WBC 11.6 (H) 3.4 - 10.8 x10E3/uL   RBC 4.62 3.77 - 5.28 x10E6/uL   Hemoglobin 12.4 11.1 - 15.9 g/dL   Hematocrit 93.7 90.2 - 46.6 %   MCV 85 79 - 97 fL   MCH 26.8 26.6 - 33.0 pg   MCHC 31.5 31.5 - 35.7 g/dL   RDW 40.9 73.5 - 32.9 %   Platelets 454 (H) 150 - 450 x10E3/uL   Neutrophils 64 Not Estab. %   Lymphs 29 Not Estab. %   Monocytes 5 Not Estab. %   Eos 1 Not Estab. %   Basos 1 Not Estab. %   Neutrophils Absolute 7.4 (H) 1.4 - 7.0 x10E3/uL   Lymphocytes Absolute 3.4 (H) 0.7 - 3.1 x10E3/uL   Monocytes Absolute 0.5 0.1 - 0.9 x10E3/uL   EOS (ABSOLUTE) 0.1 0.0 - 0.4 x10E3/uL   Basophils Absolute 0.1 0.0 - 0.2 x10E3/uL   Immature Granulocytes 0 Not Estab. %   Immature Grans (Abs) 0.0 0.0 - 0.1 x10E3/uL  Comprehensive metabolic panel  Result Value Ref Range   Glucose 79 70 - 99 mg/dL   BUN 9 6 - 20 mg/dL   Creatinine, Ser 9.24 0.57 -  1.00 mg/dL    eGFR 161119 >09>59 UE/AVW/0.98mL/min/1.73   BUN/Creatinine Ratio 14 9 - 23   Sodium 140 134 - 144 mmol/L   Potassium 4.0 3.5 - 5.2 mmol/L   Chloride 103 96 - 106 mmol/L   CO2 22 20 - 29 mmol/L   Calcium 9.1 8.7 - 10.2 mg/dL   Total Protein 6.6 6.0 - 8.5 g/dL   Albumin 4.2 3.9 - 4.9 g/dL   Globulin, Total 2.4 1.5 - 4.5 g/dL   Albumin/Globulin Ratio 1.8 1.2 - 2.2   Bilirubin Total <0.2 0.0 - 1.2 mg/dL   Alkaline Phosphatase 104 44 - 121 IU/L   AST 14 0 - 40 IU/L   ALT 19 0 - 32 IU/L  Lipid Panel w/o Chol/HDL Ratio  Result Value Ref Range   Cholesterol, Total 182 100 - 199 mg/dL   Triglycerides 119104 0 - 149 mg/dL   HDL 45 >14>39 mg/dL   VLDL Cholesterol Cal 19 5 - 40 mg/dL   LDL Chol Calc (NIH) 782118 (H) 0 - 99 mg/dL  Urinalysis, Routine w reflex microscopic  Result Value Ref Range   Specific Gravity, UA 1.010 1.005 - 1.030   pH, UA 7.0 5.0 - 7.5   Color, UA Yellow Yellow   Appearance Ur Clear Clear   Leukocytes,UA Trace (A) Negative   Protein,UA Negative Negative/Trace   Glucose, UA Negative Negative   Ketones, UA Negative Negative   RBC, UA Negative Negative   Bilirubin, UA Negative Negative   Urobilinogen, Ur 0.2 0.2 - 1.0 mg/dL   Nitrite, UA Negative Negative   Microscopic Examination See below:   TSH  Result Value Ref Range   TSH 2.390 0.450 - 4.500 uIU/mL  B12  Result Value Ref Range   Vitamin B-12 151 (L) 232 - 1,245 pg/mL      Assessment & Plan:   Problem List Items Addressed This Visit       Other   Bipolar 2 disorder - Primary    Exacerbated due to issues with her relationship. Will continue current regimen and get in with new psychiatrist. To see counselor tomorrow. Continue to monitor. Call with any concerns.      Relevant Orders   Ambulatory referral to Psychiatry     Follow up plan: Return if symptoms worsen or fail to improve.

## 2022-12-11 NOTE — Assessment & Plan Note (Signed)
Exacerbated due to issues with her relationship. Will continue current regimen and get in with new psychiatrist. To see counselor tomorrow. Continue to monitor. Call with any concerns.

## 2022-12-12 DIAGNOSIS — F4322 Adjustment disorder with anxiety: Secondary | ICD-10-CM | POA: Diagnosis not present

## 2022-12-14 DIAGNOSIS — S0093XA Contusion of unspecified part of head, initial encounter: Secondary | ICD-10-CM | POA: Diagnosis not present

## 2022-12-14 DIAGNOSIS — W109XXA Fall (on) (from) unspecified stairs and steps, initial encounter: Secondary | ICD-10-CM | POA: Diagnosis not present

## 2022-12-14 DIAGNOSIS — M79674 Pain in right toe(s): Secondary | ICD-10-CM | POA: Diagnosis not present

## 2022-12-14 DIAGNOSIS — S0990XA Unspecified injury of head, initial encounter: Secondary | ICD-10-CM | POA: Diagnosis not present

## 2022-12-14 DIAGNOSIS — S060X0A Concussion without loss of consciousness, initial encounter: Secondary | ICD-10-CM | POA: Diagnosis not present

## 2022-12-14 DIAGNOSIS — S99922A Unspecified injury of left foot, initial encounter: Secondary | ICD-10-CM | POA: Diagnosis not present

## 2022-12-18 ENCOUNTER — Encounter: Payer: Self-pay | Admitting: Family Medicine

## 2022-12-18 ENCOUNTER — Telehealth: Payer: Self-pay | Admitting: Family Medicine

## 2022-12-18 NOTE — Telephone Encounter (Signed)
Pt is caling to request a call back from the nurse regarding forms for medical leave.Pt will drop the forms off today. Please advise [phone]

## 2022-12-19 DIAGNOSIS — F4322 Adjustment disorder with anxiety: Secondary | ICD-10-CM | POA: Diagnosis not present

## 2022-12-22 NOTE — Telephone Encounter (Signed)
Patient stated that she will not need the short term disability forms completed at this time. Patient expressed understanding that she will need an appt to have form completed.

## 2022-12-22 NOTE — Telephone Encounter (Signed)
Patient will need an appointment for forms.

## 2022-12-24 DIAGNOSIS — F3181 Bipolar II disorder: Secondary | ICD-10-CM | POA: Diagnosis not present

## 2022-12-26 DIAGNOSIS — F4322 Adjustment disorder with anxiety: Secondary | ICD-10-CM | POA: Diagnosis not present

## 2022-12-31 DIAGNOSIS — F4322 Adjustment disorder with anxiety: Secondary | ICD-10-CM | POA: Diagnosis not present

## 2023-01-02 ENCOUNTER — Ambulatory Visit: Payer: BC Managed Care – PPO | Attending: Cardiology | Admitting: Cardiology

## 2023-01-02 ENCOUNTER — Encounter: Payer: Self-pay | Admitting: Cardiology

## 2023-01-13 ENCOUNTER — Ambulatory Visit: Payer: BC Managed Care – PPO

## 2023-01-13 ENCOUNTER — Ambulatory Visit (INDEPENDENT_AMBULATORY_CARE_PROVIDER_SITE_OTHER): Payer: BC Managed Care – PPO

## 2023-01-13 DIAGNOSIS — E538 Deficiency of other specified B group vitamins: Secondary | ICD-10-CM

## 2023-02-13 ENCOUNTER — Ambulatory Visit (INDEPENDENT_AMBULATORY_CARE_PROVIDER_SITE_OTHER): Payer: Self-pay

## 2023-02-13 DIAGNOSIS — E538 Deficiency of other specified B group vitamins: Secondary | ICD-10-CM

## 2023-03-16 ENCOUNTER — Ambulatory Visit: Payer: Self-pay

## 2023-05-12 ENCOUNTER — Ambulatory Visit
Admission: RE | Admit: 2023-05-12 | Discharge: 2023-05-12 | Disposition: A | Discharge status: Home / Self Care | Payer: Self-pay | Attending: Physician Assistant | Admitting: Physician Assistant

## 2023-05-12 ENCOUNTER — Encounter: Payer: Self-pay | Admitting: Emergency Medicine

## 2023-05-12 ENCOUNTER — Ambulatory Visit
Admission: EM | Admit: 2023-05-12 | Discharge: 2023-05-12 | Disposition: A | Discharge status: Home / Self Care | Payer: BC Managed Care – PPO | Attending: Physician Assistant | Admitting: Physician Assistant

## 2023-05-12 DIAGNOSIS — S40021A Contusion of right upper arm, initial encounter: Secondary | ICD-10-CM

## 2023-05-12 DIAGNOSIS — M7989 Other specified soft tissue disorders: Secondary | ICD-10-CM | POA: Insufficient documentation

## 2023-05-12 DIAGNOSIS — M79601 Pain in right arm: Secondary | ICD-10-CM

## 2023-05-12 NOTE — Discharge Instructions (Signed)
-  I put an order in for you to have an ultrasound of your right arm performed today to assess for blood clot.  When I get the results I will contact you with them. - It is possible this is just a hematoma related to the blood draw/plasma donation.  I would advise you ice the area every couple of hours for the next 2 days and then switch to using heat.  Tylenol and/or Motrin as needed for pain relief.

## 2023-05-12 NOTE — ED Provider Notes (Signed)
[facility]    CSN: [id number] Arrival date & time: 05/12/23  1118      History   Chief Complaint Chief Complaint  Patient presents with   Bleeding/Bruising    HPI Alexis Rangel is a 37 y.o. female presenting for concerns about potential blood clot in the right arm.  She states 2 weeks ago she donated plasma.  She notes having bruising and swelling of her arm at that time which has started to improve.  However, she started to develop another area of swelling, bruising and pain near the original area and did not have an injury.  She states pain radiates up her arm and she also feels tingling sensation in her fingers.  She denies any erythema or increased warmth.  No wounds.  Taking OTC meds.  No history of blood clotting disorder, DVT, PE.  History significant for PCOS, headaches, idiopathic intracranial hypertension.  HPI  Past Medical History:  Diagnosis Date   Bipolar 1 disorder (HCC)    Depression    Headache    PCO (polycystic ovaries)     Patient Active Problem List   Diagnosis Date Noted   Family history of heart disease 11/11/2022   Pseudotumor cerebri 03/18/2020   B12 deficiency 01/17/2019   Bipolar 2 disorder (HCC) 06/11/2017   Idiopathic intracranial hypertension 05/25/2017   Papilledema 05/01/2017   Pelvic pain in female 05/20/2016   Hypocalcemia 03/11/2016   Hyperinsulinemia 03/11/2016   PCO (polycystic ovaries)    Morbid obesity (HCC) 10/17/2015   Migraine 10/17/2015   Cholelithiasis 10/16/2015    Past Surgical History:  Procedure Laterality Date   ccy  06/09/2014   pt states she does not remember this surgery   CHOLECYSTECTOMY     TONSILLECTOMY AND ADENOIDECTOMY     WISDOM TOOTH EXTRACTION      OB History     Gravida  0   Para  0   Term  0   Preterm  0   AB  0   Living  0      SAB  0   IAB  0   Ectopic  0   Multiple  0   Live Births               Home Medications    Prior to Admission medications    Medication Sig Start Date End Date Taking? Authorizing Provider  acetaminophen (TYLENOL) 500 MG tablet Take 500 mg by mouth every 6 (six) hours as needed. Patient not taking: Reported on 12/11/2022    [provider]  cephALEXin (KEFLEX) 500 MG capsule Take 1 capsule by mouth 3 (three) times daily.    [provider]  clotrimazole-betamethasone (LOTRISONE) cream APPLY CREAM TOPICALLY TO AFFECTED AND SURROUNDING AREAS OF SKIN TWICE DAILY IN THE MORING AND EVENING FOR 2 WEEKS    [provider]  fluticasone (FLONASE) 50 MCG/ACT nasal spray fluticasone propionate 50 mcg/actuation nasal spray,suspension    [provider]  Galcanezumab-gnlm (EMGALITY) 120 MG/ML SOAJ Inject 120 mg into the skin every 30 (thirty) days. 09/09/22   Johnson, Megan P, DO  hydrOXYzine (ATARAX) 25 MG tablet Take 1-2 tablets (25-50 mg total) by mouth 3 (three) times daily as needed. Patient not taking: Reported on 12/11/2022 09/09/22   Olevia Perches P, DO  ibuprofen (ADVIL) 800 MG tablet Take 1 tablet (800 mg total) by mouth every 8 (eight) hours as needed. 09/09/22   Johnson, Megan P, DO  Levonorgestrel (KYLEENA) 19.5 MG IUD  1 Device by Intrauterine route once. 01/18/16   Shambley, Melody N, CNM  Lurasidone HCl 60 MG TABS SMARTSIG:1 Tablet(s) By Mouth Every Evening 12/03/22   [provider]  metFORMIN (GLUCOPHAGE) 500 MG tablet Take one tablet by mouth daily for one week. Then increase to one tablet twice a day for one week.  Then two tablets twice a day. 10/10/22   Linzie Collin, MD  methocarbamol (ROBAXIN) 500 MG tablet Take 1 tablet (500 mg total) by mouth 2 (two) times daily. 11/17/22   Katha Cabal, DO  naproxen (NAPROSYN) 500 MG tablet Take 1 tablet (500 mg total) by mouth 2 (two) times daily with a meal. 11/17/22   Brimage, Seward Meth, DO  prochlorperazine (COMPAZINE) 5 MG tablet Take 1 tablet (5 mg total) by mouth every 6 (six) hours as needed for nausea or vomiting. 11/17/22    Brimage, Seward Meth, DO  SUMAtriptan (IMITREX) 100 MG tablet Take 1/2 tablet at onset of migraine. May repeat in 2 hours if headache persists or recurs. Patient not taking: Reported on 12/11/2022 09/09/22   Olevia Perches P, DO  traZODone (DESYREL) 100 MG tablet Take 100 mg by mouth at bedtime as needed. 11/20/22   [provider]    Family History Family History  Problem Relation Age of Onset   Cancer Mother        cervical   COPD Mother    Hyperlipidemia Father    Hypertension Father    Migraines Father    Heart attack Father    Bipolar disorder Sister    Heart attack Maternal Uncle    Heart attack Paternal Uncle    Stroke Paternal Uncle    Heart disease Maternal Grandmother    COPD Maternal Grandmother    Heart disease Maternal Grandfather    Hypertension Paternal Grandmother    Heart disease Paternal Grandmother    Hypertension Paternal Grandfather    Heart disease Paternal Grandfather    Alzheimer's disease Paternal Grandfather    Diabetes Neg Hx     Social History Social History   Tobacco Use   Smoking status: Never   Smokeless tobacco: Never  Vaping Use   Vaping status: Never Used  Substance Use Topics   Alcohol use: Not Currently    Comment: occasionally   Drug use: No     Allergies   Topamax [topiramate]   Review of Systems Review of Systems  Constitutional:  Negative for fatigue and fever.  Musculoskeletal:  Positive for joint swelling. Negative for arthralgias.  Skin:  Positive for color change. Negative for wound.  Neurological:  Negative for weakness and numbness.     Physical Exam Triage Vital Signs ED Triage Vitals  Encounter Vitals Group     BP 05/12/23 1134 124/80     Systolic BP Percentile --      Diastolic BP Percentile --      Pulse Rate 05/12/23 1134 81     Resp 05/12/23 1134 18     Temp 05/12/23 1134 98.8 F (37.1 C)     Temp Source 05/12/23 1134 Oral     SpO2 05/12/23 1134 97 %     Weight --      Height --      Head  Circumference --      Peak Flow --      Pain Score 05/12/23 1133 7     Pain Loc --      Pain Education --      Exclude from [employer]  Chart --    No data found.  Updated Vital Signs BP 124/80 (BP Location: Left Arm)   Pulse 81   Temp 98.8 F (37.1 C) (Oral)   Resp 18   SpO2 97%      Physical Exam Vitals and nursing note reviewed.  Constitutional:      General: She is not in acute distress.    Appearance: Normal appearance. She is not ill-appearing or toxic-appearing.  HENT:     Head: Normocephalic and atraumatic.  Eyes:     General: No scleral icterus.       Right eye: No discharge.        Left eye: No discharge.     Conjunctiva/sclera: Conjunctivae normal.  Cardiovascular:     Rate and Rhythm: Normal rate and regular rhythm.     Heart sounds: Normal heart sounds.  Pulmonary:     Effort: Pulmonary effort is normal. No respiratory distress.     Breath sounds: Normal breath sounds.  Musculoskeletal:     Cervical back: Neck supple.  Skin:    General: Skin is dry.     Findings: Bruising present.     Comments: Right arm: There is a large area of contusion and hematoma of the antecubital region.  Adjacent to that there is another area of firmness and contusion.  Tenderness throughout the arm.  Full range of motion of elbow.  Full strength of extremity and good pulses.  Neurological:     General: No focal deficit present.     Mental Status: She is alert. Mental status is at baseline.     Motor: No weakness.     Gait: Gait normal.  Psychiatric:        Mood and Affect: Mood normal.        Behavior: Behavior normal.        Thought Content: Thought content normal.      UC Treatments / Results  Labs (all labs ordered are listed, but only abnormal results are displayed) Labs Reviewed - No data to display  EKG   Radiology US Venous Img Upper Uni Right(DVT)  Result Date: 05/12/2023 CLINICAL DATA:  bruising and swelling of right arm 2 weeks after plasma donation EXAM:  RIGHT UPPER EXTREMITY VENOUS DOPPLER ULTRASOUND TECHNIQUE: Gray-scale sonography with graded compression, as well as color Doppler and duplex ultrasound were performed to evaluate the upper extremity deep venous system from the level of the subclavian vein and including the jugular, axillary, basilic, radial, ulnar and upper cephalic vein. Spectral Doppler was utilized to evaluate flow at rest and with distal augmentation maneuvers. COMPARISON:  Chest XR, 02/06/2021 FINDINGS: VENOUS Normal compressibility of the RIGHT internal jugular, subclavian, axillary, cephalic, basilic, brachial, radial and ulnar veins. No filling defects to suggest DVT on grayscale or color Doppler imaging. Doppler waveforms show normal direction of venous flow, normal respiratory plasticity and response to augmentation. Limited views of the contralateral subclavian vein are unremarkable. OTHER No evidence of superficial thrombophlebitis or abnormal fluid collection. Limitations: none IMPRESSION: No evidence of DVT or superficial thrombophlebitis within the RIGHT upper extremity. Roanna Banning, MD [facility] Electronically Signed   By: Roanna Banning M.D.   On: 05/12/2023 16:17    Procedures Procedures (including critical care time)  Medications Ordered in UC Medications - No data to display  Initial Impression / Assessment and Plan / UC Course  I have reviewed the triage vital signs and the nursing notes.  Pertinent labs &  imaging results that were available during my care of the patient were reviewed by me and considered in my medical decision making (see chart for details).   37 year old female presents for bruising and swelling of the right arm for the past 2 weeks after donating plasma.  She states a new area of bruising, swelling and pain has developed and now she has pain radiating up her arm and tingling in her hand.  Concern for blood clot.  Ordered ultrasound to  be performed of the right upper extremity to assess for potential DVT.  Will notify patient with results.  Likely hematoma.  We discussed care of hematoma.  Reviewed ultrasound results with patient which did not find a DVT or any other acute abnormality.  Again discussed that this is due to hematoma and contusion.  Supportive care was discussed and handout given at discharge.   Final Clinical Impressions(s) / UC Diagnoses   Final diagnoses:  Contusion of right upper extremity, initial encounter  Right arm pain     Discharge Instructions      -I put an order in for you to have an ultrasound of your right arm performed today to assess for blood clot.  When I get the results I will contact you with them. - It is possible this is just a hematoma related to the blood draw/plasma donation.  I would advise you ice the area every couple of hours for the next 2 days and then switch to using heat.  Tylenol and/or Motrin as needed for pain relief.     ED Prescriptions   None    PDMP not reviewed this encounter.   Shirlee Latch, PA-C 05/12/23 1655

## 2023-05-12 NOTE — ED Triage Notes (Signed)
Pt donated plasma 2 weeks ago and has bruising near the site. A knot developed with some bruising on her right forearm yesterday.

## 2023-05-22 ENCOUNTER — Ambulatory Visit
Admission: RE | Admit: 2023-05-22 | Discharge: 2023-05-22 | Disposition: A | Discharge status: Home / Self Care | Payer: Self-pay | Source: Ambulatory Visit | Attending: Emergency Medicine | Admitting: Emergency Medicine

## 2023-05-22 VITALS — BP 122/83 | HR 85 | Temp 98.5°F | Resp 14 | Ht 62.0 in | Wt 233.2 lb

## 2023-05-22 DIAGNOSIS — N342 Other urethritis: Secondary | ICD-10-CM

## 2023-05-22 MED ORDER — SULFAMETHOXAZOLE-TRIMETHOPRIM 800-160 MG PO TABS: 1.0000 | ORAL_TABLET | Freq: Two times a day (BID) | ORAL | 0 refills | Status: AC

## 2023-05-22 NOTE — Discharge Instructions (Addendum)
Take the Bactrim DS twice daily with a full glass of water for 7 days for treatment of your inflamed/infected Skene's gland.  You may apply warm compresses to the area to see if you can get the gland to drain on its own.  If the swelling continues, or increases, I recommend that you follow-up with your OB/GYN for definitive management.

## 2023-05-22 NOTE — ED Triage Notes (Signed)
Patient states that she has had an abscess on the inside of her left labia since Tuesday.  Patient reports some drainage from the area.  Patient denies fevers.
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
## 2023-05-28 ENCOUNTER — Other Ambulatory Visit: Payer: Self-pay

## 2023-05-28 ENCOUNTER — Emergency Department
Admission: EM | Admit: 2023-05-28 | Discharge: 2023-05-28 | Disposition: A | Discharge status: Home / Self Care | Payer: Self-pay | Attending: Emergency Medicine | Admitting: Emergency Medicine

## 2023-05-28 ENCOUNTER — Encounter: Payer: Self-pay | Admitting: *Deleted

## 2023-05-28 DIAGNOSIS — S0003XA Contusion of scalp, initial encounter: Secondary | ICD-10-CM | POA: Insufficient documentation
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
## 2023-07-01 ENCOUNTER — Telehealth: Payer: Self-pay

## 2023-07-01 NOTE — Telephone Encounter (Signed)
Transition Care Management Unsuccessful Follow-up Telephone Call  Date of discharge and from where:  05/28/2023 [facility]  Attempts:  1st Attempt  Reason for unsuccessful TCM follow-up call:  Left voice message  Alexis Rangel Patient [name]  [facility], [facility] Resource Care Guide Direct Dial: [phone]  Website: [email or web address]

## 2023-07-02 ENCOUNTER — Telehealth: Payer: Self-pay

## 2023-07-02 NOTE — Telephone Encounter (Signed)
Transition Care Management Unsuccessful Follow-up Telephone Call  Date of discharge and from where:  05/28/2023 [facility]  Attempts:  2nd Attempt  Reason for unsuccessful TCM follow-up call:  Voice mail full  [facility]angel [facility]  [facility], [facility] Guide Direct Dial: [phone]  Website: Alexis Rangel

## 2023-08-07 ENCOUNTER — Encounter: Payer: Self-pay | Admitting: Family Medicine

## 2023-08-07 ENCOUNTER — Emergency Department
Admission: EM | Admit: 2023-08-07 | Discharge: 2023-08-07 | Discharge status: Against Medical Advice | Payer: Self-pay | Attending: Emergency Medicine | Admitting: Emergency Medicine

## 2023-08-07 ENCOUNTER — Other Ambulatory Visit: Payer: Self-pay

## 2023-08-07 ENCOUNTER — Encounter: Payer: Self-pay | Admitting: Emergency Medicine

## 2023-08-07 DIAGNOSIS — K5900 Constipation, unspecified: Secondary | ICD-10-CM | POA: Insufficient documentation

## 2023-08-07 DIAGNOSIS — Z5321 Procedure and treatment not carried out due to patient leaving prior to being seen by health care provider: Secondary | ICD-10-CM | POA: Insufficient documentation

## 2023-08-07 LAB — CBC WITH DIFFERENTIAL/PLATELET
Abs Immature Granulocytes: 0.04 10*3/uL (ref 0.00–0.07)
Basophils Absolute: 0.1 10*3/uL (ref 0.0–0.1)
Basophils Relative: 0 %
Eosinophils Absolute: 0.1 10*3/uL (ref 0.0–0.5)
Eosinophils Relative: 1 %
HCT: 39.1 % (ref 36.0–46.0)
Hemoglobin: 12.4 g/dL (ref 12.0–15.0)
Immature Granulocytes: 0 %
Lymphocytes Relative: 17 %
Lymphs Abs: 2.4 10*3/uL (ref 0.7–4.0)
MCH: 26.4 pg (ref 26.0–34.0)
MCHC: 31.7 g/dL (ref 30.0–36.0)
MCV: 83.4 fL (ref 80.0–100.0)
Monocytes Absolute: 0.8 10*3/uL (ref 0.1–1.0)
Monocytes Relative: 5 %
Neutro Abs: 10.6 10*3/uL — ABNORMAL HIGH (ref 1.7–7.7)
Neutrophils Relative %: 77 %
Platelets: 520 10*3/uL — ABNORMAL HIGH (ref 150–400)
RBC: 4.69 MIL/uL (ref 3.87–5.11)
RDW: 14.8 % (ref 11.5–15.5)
WBC: 14 10*3/uL — ABNORMAL HIGH (ref 4.0–10.5)
nRBC: 0 % (ref 0.0–0.2)

## 2023-08-07 LAB — COMPREHENSIVE METABOLIC PANEL
ALT: 16 U/L (ref 0–44)
AST: 11 U/L — ABNORMAL LOW (ref 15–41)
Albumin: 3.5 g/dL (ref 3.5–5.0)
Alkaline Phosphatase: 78 U/L (ref 38–126)
Anion gap: 9 (ref 5–15)
BUN: 11 mg/dL (ref 6–20)
CO2: 24 mmol/L (ref 22–32)
Calcium: 8.3 mg/dL — ABNORMAL LOW (ref 8.9–10.3)
Chloride: 104 mmol/L (ref 98–111)
Creatinine, Ser: 0.7 mg/dL (ref 0.44–1.00)
GFR, Estimated: >60 mL/min (ref >60)
Glucose, Bld: 115 mg/dL — ABNORMAL HIGH (ref 70–99)
Potassium: 3.6 mmol/L (ref 3.5–5.1)
Sodium: 137 mmol/L (ref 135–145)
Total Bilirubin: 0.9 mg/dL (ref <1.2)
Total Protein: 6.8 g/dL (ref 6.5–8.1)

## 2023-08-07 LAB — LIPASE, BLOOD: Lipase: 27 U/L (ref 11–51)

## 2023-08-07 NOTE — ED Provider Triage Note (Signed)
Emergency Medicine Provider Triage Evaluation Note  Alexis Rangel , a 37 y.o. female h/o PCOS was evaluated in triage.  Pt complains of left lower abdominal pain x3 days. Thought she was constipated, but has since moved her bowels without pain relief. No N/V/D. No fevers/chills. No dysuria. No vaginal discharge. Reports it awoke her from sleep today. Worried about her IUD.    Review of Systems  Positive: Llq pain Negative: N/v/d  Physical Exam  There were no vitals taken for this visit. Gen:   Awake, no distress   Resp:  Normal effort  MSK:   Moves extremities without difficulty  Other:    Medical Decision Making  Medically screening exam initiated at 7:25 AM.  Appropriate orders placed.  Alexis Rangel was informed that the remainder of the evaluation will be completed by another provider, this initial triage assessment does not replace that evaluation, and the importance of remaining in the ED until their evaluation is complete.     Jackelyn Hoehn, PA-C 08/07/23 [phone]

## 2023-08-07 NOTE — ED Triage Notes (Signed)
Patient to ED via POV for LLQ abd pain. Pain ongoing x3 days. Denies N/V/D. Last BM this AM.

## 2023-08-14 ENCOUNTER — Ambulatory Visit: Payer: Self-pay | Admitting: Obstetrics and Gynecology

## 2023-09-11 ENCOUNTER — Encounter: Payer: Self-pay | Admitting: Family Medicine

## 2023-09-11 ENCOUNTER — Ambulatory Visit (INDEPENDENT_AMBULATORY_CARE_PROVIDER_SITE_OTHER): Payer: Managed Care, Other (non HMO) | Admitting: Family Medicine

## 2023-09-11 VITALS — BP 115/79 | HR 90 | Wt 228.8 lb

## 2023-09-11 DIAGNOSIS — F3181 Bipolar II disorder: Secondary | ICD-10-CM

## 2023-09-11 DIAGNOSIS — G43109 Migraine with aura, not intractable, without status migrainosus: Secondary | ICD-10-CM | POA: Diagnosis not present

## 2023-09-11 DIAGNOSIS — G932 Benign intracranial hypertension: Secondary | ICD-10-CM | POA: Diagnosis not present

## 2023-09-11 MED ORDER — EMGALITY 120 MG/ML ~~LOC~~ SOAJ: 120.0000 mg | SUBCUTANEOUS | 12 refills | Status: DC

## 2023-09-11 MED ORDER — SUMATRIPTAN SUCCINATE 50 MG PO TABS: 50.0000 mg | ORAL_TABLET | ORAL | 6 refills | Status: DC | PRN

## 2023-09-11 MED ORDER — EMGALITY 120 MG/ML ~~LOC~~ SOAJ: 240.0000 mg | Freq: Once | SUBCUTANEOUS | 0 refills | Status: AC

## 2023-09-11 NOTE — Assessment & Plan Note (Signed)
 Will get her restarted on her emgality and her imitrex. Recheck in about 2 months. Call with any concerns.

## 2023-09-11 NOTE — Assessment & Plan Note (Signed)
 It's been almost 2 years since her last lumbar puncture. Will get her back into neurology. Await their input.

## 2023-09-11 NOTE — Assessment & Plan Note (Signed)
 Would like to get back into psych, but didn't really like her last psychiatrist.Will put in new referral. Call with any concerns.

## 2023-09-11 NOTE — Progress Notes (Signed)
 BP 115/79   Pulse 90   Wt 228 lb 12.8 oz (103.8 kg)   SpO2 98%   BMI 41.85 kg/m    Subjective:    Patient ID: Alexis Rangel, female    DOB: 07/09/1986, 38 y.o.   MRN: [id number]  HPI: Alexis Rangel is a 38 y.o. female  Chief Complaint  Patient presents with   Migraine    Patient says she went without insurance for a while and did not have the money to come in for an appointment. Patient says she would like to discuss restart Emgality . Patient says her last spot was around May. Patient has been using Ibuprofen  to help.    MIGRAINES Duration: chronic Onset: sudden Severity: severe Quality: sharp and stabbing Frequency: 1-2x a week Location: behind her eye Headache duration: hours- days Radiation: no Headache status at time of visit: current headache Treatments attempted: Treatments attempted: rest, ice, heat, APAP, ibuprofen , aleve , excedrine, triptans, propranolol, topamax , and amitriptyline , emgality    Aura: no Nausea:  yes Vomiting: no Photophobia:  yes Phonophobia:  yes Effect on social functioning:  yes Numbers of missed days of school/work each month:  Confusion:  no Gait disturbance/ataxia:  no Behavioral changes:  no Fevers:  no  Relevant past medical, surgical, family and social history reviewed and updated as indicated. Interim medical history since our last visit reviewed. Allergies and medications reviewed and updated.  Review of Systems  Constitutional: Negative.   Musculoskeletal: Negative.   Neurological:  Positive for headaches. Negative for dizziness, tremors, seizures, syncope, facial asymmetry, speech difficulty, weakness, light-headedness and numbness.  Psychiatric/Behavioral: Negative.      Per HPI unless specifically indicated above     Objective:    BP 115/79   Pulse 90   Wt 228 lb 12.8 oz (103.8 kg)   SpO2 98%   BMI 41.85 kg/m   Wt Readings from Last 3 Encounters:  09/11/23 228 lb 12.8 oz (103.8 kg)  05/22/23 233 lb  4 oz (105.8 kg)  12/11/22 233 lb 3.2 oz (105.8 kg)    Physical Exam Vitals and nursing note reviewed.  Constitutional:      General: She is not in acute distress.    Appearance: Normal appearance. She is obese. She is not ill-appearing, toxic-appearing or diaphoretic.  HENT:     Head: Normocephalic and atraumatic.     Right Ear: External ear normal.     Left Ear: External ear normal.     Nose: Nose normal.     Mouth/Throat:     Mouth: Mucous membranes are moist.     Pharynx: Oropharynx is clear.  Eyes:     General: No scleral icterus.       Right eye: No discharge.        Left eye: No discharge.     Extraocular Movements: Extraocular movements intact.     Conjunctiva/sclera: Conjunctivae normal.     Pupils: Pupils are equal, round, and reactive to light.  Cardiovascular:     Rate and Rhythm: Normal rate and regular rhythm.     Pulses: Normal pulses.     Heart sounds: Normal heart sounds. No murmur heard.    No friction rub. No gallop.  Pulmonary:     Effort: Pulmonary effort is normal. No respiratory distress.     Breath sounds: Normal breath sounds. No stridor. No wheezing, rhonchi or rales.  Chest:     Chest wall: No tenderness.  Musculoskeletal:  General: Normal range of motion.     Cervical back: Normal range of motion and neck supple.  Skin:    General: Skin is warm and dry.     Capillary Refill: Capillary refill takes less than 2 seconds.     Coloration: Skin is not jaundiced or pale.     Findings: No bruising, erythema, lesion or rash.  Neurological:     General: No focal deficit present.     Mental Status: She is alert and oriented to person, place, and time. Mental status is at baseline.  Psychiatric:        Mood and Affect: Mood normal.        Behavior: Behavior normal.        Thought Content: Thought content normal.        Judgment: Judgment normal.     Results for orders placed or performed in visit on 11/11/22  Microscopic Examination    Collection Time: 11/11/22 10:12 AM   Urine  Result Value Ref Range   WBC, UA 0-5 0 - 5 /hpf   RBC, Urine 0-2 0 - 2 /hpf   Epithelial Cells (non renal) 0-10 0 - 10 /hpf   Bacteria, UA None seen None seen/Few  Urinalysis, Routine w reflex microscopic   Collection Time: 11/11/22 10:12 AM  Result Value Ref Range   Specific Gravity, UA 1.010 1.005 - 1.030   pH, UA 7.0 5.0 - 7.5   Color, UA Yellow Yellow   Appearance Ur Clear Clear   Leukocytes,UA Trace (A) Negative   Protein,UA Negative Negative/Trace   Glucose, UA Negative Negative   Ketones, UA Negative Negative   RBC, UA Negative Negative   Bilirubin, UA Negative Negative   Urobilinogen, Ur 0.2 0.2 - 1.0 mg/dL   Nitrite, UA Negative Negative   Microscopic Examination See below:   CBC with Differential/Platelet   Collection Time: 11/11/22 10:13 AM  Result Value Ref Range   WBC 11.6 (H) 3.4 - 10.8 x10E3/uL   RBC 4.62 3.77 - 5.28 x10E6/uL   Hemoglobin 12.4 11.1 - 15.9 g/dL   Hematocrit 60.5 65.9 - 46.6 %   MCV 85 79 - 97 fL   MCH 26.8 26.6 - 33.0 pg   MCHC 31.5 31.5 - 35.7 g/dL   RDW 86.2 88.2 - 84.5 %   Platelets 454 (H) 150 - 450 x10E3/uL   Neutrophils 64 Not Estab. %   Lymphs 29 Not Estab. %   Monocytes 5 Not Estab. %   Eos 1 Not Estab. %   Basos 1 Not Estab. %   Neutrophils Absolute 7.4 (H) 1.4 - 7.0 x10E3/uL   Lymphocytes Absolute 3.4 (H) 0.7 - 3.1 x10E3/uL   Monocytes Absolute 0.5 0.1 - 0.9 x10E3/uL   EOS (ABSOLUTE) 0.1 0.0 - 0.4 x10E3/uL   Basophils Absolute 0.1 0.0 - 0.2 x10E3/uL   Immature Granulocytes 0 Not Estab. %   Immature Grans (Abs) 0.0 0.0 - 0.1 x10E3/uL  Comprehensive metabolic panel   Collection Time: 11/11/22 10:13 AM  Result Value Ref Range   Glucose 79 70 - 99 mg/dL   BUN 9 6 - 20 mg/dL   Creatinine, Ser 9.36 0.57 - 1.00 mg/dL   eGFR 880 >40 fO/fpw/8.26   BUN/Creatinine Ratio 14 9 - 23   Sodium 140 134 - 144 mmol/L   Potassium 4.0 3.5 - 5.2 mmol/L   Chloride 103 96 - 106 mmol/L   CO2 22 20 -  29 mmol/L   Calcium 9.1 8.7 -  10.2 mg/dL   Total Protein 6.6 6.0 - 8.5 g/dL   Albumin 4.2 3.9 - 4.9 g/dL   Globulin, Total 2.4 1.5 - 4.5 g/dL   Albumin/Globulin Ratio 1.8 1.2 - 2.2   Bilirubin Total <0.2 0.0 - 1.2 mg/dL   Alkaline Phosphatase 104 44 - 121 IU/L   AST 14 0 - 40 IU/L   ALT 19 0 - 32 IU/L  Lipid Panel w/o Chol/HDL Ratio   Collection Time: 11/11/22 10:13 AM  Result Value Ref Range   Cholesterol, Total 182 100 - 199 mg/dL   Triglycerides 895 0 - 149 mg/dL   HDL 45 >60 mg/dL   VLDL Cholesterol Cal 19 5 - 40 mg/dL   LDL Chol Calc (NIH) 881 (H) 0 - 99 mg/dL  TSH   Collection Time: 11/11/22 10:13 AM  Result Value Ref Range   TSH 2.390 0.450 - 4.500 uIU/mL  B12   Collection Time: 11/11/22 10:13 AM  Result Value Ref Range   Vitamin B-12 151 (L) 232 - 1,245 pg/mL      Assessment & Plan:   Problem List Items Addressed This Visit       Cardiovascular and Mediastinum   Migraine   Will get her restarted on her emgality  and her imitrex . Recheck in about 2 months. Call with any concerns.       Relevant Medications   Galcanezumab -gnlm (EMGALITY ) 120 MG/ML SOAJ (Start on 10/12/2023)   SUMAtriptan  (IMITREX ) 50 MG tablet   Galcanezumab -gnlm (EMGALITY ) 120 MG/ML SOAJ     Nervous and Auditory   Pseudotumor cerebri   It's been almost 2 years since her last lumbar puncture. Will get her back into neurology. Await their input.       Relevant Orders   Ambulatory referral to Neurology     Other   Bipolar 2 disorder (HCC) - Primary   Would like to get back into psych, but didn't really like her last psychiatrist.Will put in new referral. Call with any concerns.       Relevant Orders   Ambulatory referral to Psychiatry     Follow up plan: Return in about 2 months (around 11/09/2023) for physical.

## 2023-09-15 ENCOUNTER — Telehealth: Payer: Self-pay | Admitting: Family Medicine

## 2023-09-15 NOTE — Telephone Encounter (Signed)
 Pt called and stated that the pharmacy needs prior authorization on the medication, SUMAtriptan (IMITREX) 50 MG tablet .   [facility] [address] [place] [address] Phone [phone]   Please advise.

## 2023-09-16 ENCOUNTER — Encounter: Payer: Self-pay | Admitting: Family Medicine

## 2023-09-16 ENCOUNTER — Ambulatory Visit (INDEPENDENT_AMBULATORY_CARE_PROVIDER_SITE_OTHER): Payer: Managed Care, Other (non HMO) | Admitting: Family Medicine

## 2023-09-16 VITALS — BP 137/75 | HR 84 | Temp 98.3°F | Wt 229.6 lb

## 2023-09-16 DIAGNOSIS — G43109 Migraine with aura, not intractable, without status migrainosus: Secondary | ICD-10-CM | POA: Diagnosis not present

## 2023-09-16 MED ORDER — KETOROLAC TROMETHAMINE 60 MG/2ML IM SOLN
60.0000 mg | Freq: Once | INTRAMUSCULAR | Status: AC
Start: 2023-09-16 — End: 2023-09-16
  Administered 2023-09-16: 60 mg via INTRAMUSCULAR

## 2023-09-16 NOTE — Progress Notes (Signed)
 BP 137/75   Pulse 84   Temp 98.3 F (36.8 C) (Oral)   Wt 229 lb 9.6 oz (104.1 kg)   SpO2 99%   BMI 41.99 kg/m    Subjective:    Patient ID: Alexis Rangel, female    DOB: May 27, 1986, 38 y.o.   MRN: [id number]  HPI: Alexis Rangel is a 38 y.o. female  Chief Complaint  Patient presents with   Migraine    Patient is here for Headaches. Patient has been having current headache for 2 days. Patient says her Ibuprofen  800 mg is no longer and says has not received her Emgality  due to insurance prior approval. Patient says the head pressure is what is bothering her the worse.    MIGRAINES- has been off her emgality  due to insurance issues.Has had migraine for 3 days. Has had to miss work. Sharp. Severe. She feels terrible. Duration: chronic Onset: sudden Severity: severe Quality: sharp and stabbing Frequency: 1-2x a week Location: behind her eye Headache duration: hours- days Radiation: no Headache status at time of visit: current headache Treatments attempted: Treatments attempted: rest, ice, heat, APAP, ibuprofen , aleve ", excedrine, triptans, propranolol, topamax , and amitriptyline , emgality    Aura: no Nausea:  yes Vomiting: no Photophobia:  yes Phonophobia:  yes Effect on social functioning:  yes Numbers of missed days of school/work each month:  Confusion:  no Gait disturbance/ataxia:  no Behavioral changes:  no Fevers:  no    Relevant past medical, surgical, family and social history reviewed and updated as indicated. Interim medical history since our last visit reviewed. Allergies and medications reviewed and updated.  Review of Systems  Constitutional: Negative.   Respiratory: Negative.    Cardiovascular: Negative.   Musculoskeletal: Negative.   Neurological:  Positive for headaches. Negative for dizziness, tremors, seizures, syncope, facial asymmetry, speech difficulty, weakness, light-headedness and numbness.  Psychiatric/Behavioral: Negative.       Per HPI unless specifically indicated above     Objective:    BP 137/75   Pulse 84   Temp 98.3 F (36.8 C) (Oral)   Wt 229 lb 9.6 oz (104.1 kg)   SpO2 99%   BMI 41.99 kg/m   Wt Readings from Last 3 Encounters:  09/16/23 229 lb 9.6 oz (104.1 kg)  09/11/23 228 lb 12.8 oz (103.8 kg)  05/22/23 233 lb 4 oz (105.8 kg)    Physical Exam Vitals and nursing note reviewed.  Constitutional:      General: She is not in acute distress.    Appearance: Normal appearance. She is well-developed.  HENT:     Head: Normocephalic and atraumatic.     Right Ear: Hearing and external ear normal.     Left Ear: Hearing and external ear normal.     Nose: Nose normal.     Mouth/Throat:     Mouth: Mucous membranes are moist.     Pharynx: Oropharynx is clear.  Eyes:     General: Lids are normal. No scleral icterus.       Right eye: No discharge.        Left eye: No discharge.     Conjunctiva/sclera: Conjunctivae normal.  Pulmonary:     Effort: Pulmonary effort is normal. No respiratory distress.  Musculoskeletal:        General: Normal range of motion.  Skin:    Coloration: Skin is not jaundiced or pale.     Findings: No bruising, erythema, lesion or rash.  Neurological:     Mental Status:  She is alert. Mental status is at baseline. She is disoriented.  Psychiatric:        Mood and Affect: Mood normal.        Speech: Speech normal.        Behavior: Behavior normal.        Thought Content: Thought content normal.        Judgment: Judgment normal.     Results for orders placed or performed in visit on 11/11/22  Microscopic Examination   Collection Time: 11/11/22 10:12 AM   Urine  Result Value Ref Range   WBC, UA 0-5 0 - 5 /hpf   RBC, Urine 0-2 0 - 2 /hpf   Epithelial Cells (non renal) 0-10 0 - 10 /hpf   Bacteria, UA None seen None seen/Few  Urinalysis, Routine w reflex microscopic   Collection Time: 11/11/22 10:12 AM  Result Value Ref Range   Specific Gravity, UA 1.010 1.005 -  1.030   pH, UA 7.0 5.0 - 7.5   Color, UA Yellow Yellow   Appearance Ur Clear Clear   Leukocytes,UA Trace (A) Negative   Protein,UA Negative Negative/Trace   Glucose, UA Negative Negative   Ketones, UA Negative Negative   RBC, UA Negative Negative   Bilirubin, UA Negative Negative   Urobilinogen, Ur 0.2 0.2 - 1.0 mg/dL   Nitrite, UA Negative Negative   Microscopic Examination See below:   CBC with Differential/Platelet   Collection Time: 11/11/22 10:13 AM  Result Value Ref Range   WBC 11.6 (H) 3.4 - 10.8 x10E3/uL   RBC 4.62 3.77 - 5.28 x10E6/uL   Hemoglobin 12.4 11.1 - 15.9 g/dL   Hematocrit 96.2 95.2 - 46.6 %   MCV 85 79 - 97 fL   MCH 26.8 26.6 - 33.0 pg   MCHC 31.5 31.5 - 35.7 g/dL   RDW 84.1 32.4 - 40.1 %   Platelets 454 (H) 150 - 450 x10E3/uL   Neutrophils 64 Not Estab. %   Lymphs 29 Not Estab. %   Monocytes 5 Not Estab. %   Eos 1 Not Estab. %   Basos 1 Not Estab. %   Neutrophils Absolute 7.4 (H) 1.4 - 7.0 x10E3/uL   Lymphocytes Absolute 3.4 (H) 0.7 - 3.1 x10E3/uL   Monocytes Absolute 0.5 0.1 - 0.9 x10E3/uL   EOS (ABSOLUTE) 0.1 0.0 - 0.4 x10E3/uL   Basophils Absolute 0.1 0.0 - 0.2 x10E3/uL   Immature Granulocytes 0 Not Estab. %   Immature Grans (Abs) 0.0 0.0 - 0.1 x10E3/uL  Comprehensive metabolic panel   Collection Time: 11/11/22 10:13 AM  Result Value Ref Range   Glucose 79 70 - 99 mg/dL   BUN 9 6 - 20 mg/dL   Creatinine, Ser 0.27 0.57 - 1.00 mg/dL   eGFR 253 >66 YQ/IHK/7.42   BUN/Creatinine Ratio 14 9 - 23   Sodium 140 134 - 144 mmol/L   Potassium 4.0 3.5 - 5.2 mmol/L   Chloride 103 96 - 106 mmol/L   CO2 22 20 - 29 mmol/L   Calcium 9.1 8.7 - 10.2 mg/dL   Total Protein 6.6 6.0 - 8.5 g/dL   Albumin 4.2 3.9 - 4.9 g/dL   Globulin, Total 2.4 1.5 - 4.5 g/dL   Albumin/Globulin Ratio 1.8 1.2 - 2.2   Bilirubin Total <0.2 0.0 - 1.2 mg/dL   Alkaline Phosphatase 104 44 - 121 IU/L   AST 14 0 - 40 IU/L   ALT 19 0 - 32 IU/L  Lipid Panel w/o Chol/HDL Ratio  Collection Time: 11/11/22 10:13 AM  Result Value Ref Range   Cholesterol, Total 182 100 - 199 mg/dL   Triglycerides 696 0 - 149 mg/dL   HDL 45 >29 mg/dL   VLDL Cholesterol Cal 19 5 - 40 mg/dL   LDL Chol Calc (NIH) 528 (H) 0 - 99 mg/dL  TSH   Collection Time: 11/11/22 10:13 AM  Result Value Ref Range   TSH 2.390 0.450 - 4.500 uIU/mL  B12   Collection Time: 11/11/22 10:13 AM  Result Value Ref Range   Vitamin B-12 151 (L) 232 - 1,245 pg/mL      Assessment & Plan:   Problem List Items Addressed This Visit       Cardiovascular and Mediastinum   Migraine - Primary   Acute x3 days. Will treat with toradol . Sample of nurtec given until emgality  approved- will not be taking both. Call with any concerns.      Relevant Medications   ketorolac  (TORADOL ) injection 60 mg     Follow up plan: Return for As scheduled.

## 2023-09-16 NOTE — Assessment & Plan Note (Signed)
 Acute x3 days. Will treat with toradol . Sample of nurtec given until emgality  approved- will not be taking both. Call with any concerns.

## 2023-09-16 NOTE — Telephone Encounter (Signed)
 Prior authorization has been initiated via CoverMyMeds for patient for prescriptions of Emgality  and Sumatriptan . Awaiting determination from patient's insurance.   KEY:  Emgality  - [id number] Sumatriptan  - [id number]

## 2023-09-17 ENCOUNTER — Encounter: Payer: Self-pay | Admitting: Family Medicine

## 2023-09-18 ENCOUNTER — Ambulatory Visit: Payer: Self-pay

## 2023-09-18 NOTE — Telephone Encounter (Signed)
Poke with patient. She is aware now that this is the 1st loading dose of medication and she is to take the 2 120 mg doses. Starting next month, no earlier than 10/12/2023 she will dose with only 1 120 mg monthly.

## 2023-09-18 NOTE — Telephone Encounter (Signed)
Reason for Disposition  [1] Caller has URGENT medicine question about med that PCP or specialist prescribed AND [2] triager unable to answer question    Questions about the Emgality  Answer Assessment - Initial Assessment Questions 1. NAME of MEDICINE: "What medicine(s) are you calling about?"     Emgality 120 mg 2. QUESTION: "What is your question?" (e.g., double dose of medicine, side effect)     She got 2 shots of 120 mg each from the pharmacy.     Does she give herself 2 shots today to start with then 120 mg after that?  Also there is a note not to start this medication until Feb. 10, 2025.   Why is that?   I thought I was to start it today?    Please clarify 3. PRESCRIBER: "Who prescribed the medicine?" Reason: if prescribed by specialist, call should be referred to that group.     Dr. Olevia Perches 4. SYMPTOMS: "Do you have any symptoms?" If Yes, ask: "What symptoms are you having?"  "How bad are the symptoms (e.g., mild, moderate, severe)     N/A 5. PREGNANCY:  "Is there any chance that you are pregnant?" "When was your last menstrual period?"     N/A  Protocols used: Medication Question Call-A-AH  Chief Complaint: Has 2 questions regarding the Emgality 120 mg. Does she take 2 shots of 120 mg each for a loading dose to start with?    She got 2 shots from the pharmacy. Also there is a note that she is not to start this unti Feb. 10, 2025.    Why is that? Symptoms: N/A Frequency: N/A Pertinent Negatives: Patient denies N/A Disposition: [] ED /[] Urgent Care (no appt availability in office) / [] Appointment(In office/virtual)/ []  [place] [facility]/ [] Home Care/ [] Refused Recommended Disposition /[] [place] [facility]/ [x]  Follow-up with PCP Additional Notes: Message sent to Dr. Laural Benes.   Please call her at this number [phone].

## 2023-09-18 NOTE — Telephone Encounter (Signed)
Message from [place] C sent at 09/18/2023  2:48 PM EST  Summary: rx concern   The patient has not taken any medication at the time of call  ----- Message from [facility] sent at 09/18/2023  2:47 PM EST ----- The patient is concerned that they may have been given the wrong dose of Galcanezumab-gnlm (EMGALITY) 120 MG/ML SOAJ [[id number]] by their pharmacy  The patient shares that they received (2) 240 MG refills labeled as loading doses  Please contact the patient further when possible        Called pt, LM on VM to CB.Called pharmacist and pt received the right dose. Pt is to take 240 mg in 2 injections one right after the other and that is her loading dose

## 2023-09-21 NOTE — Telephone Encounter (Signed)
Sumatriptan is a generic, shouldn't have needed a PA to begin with- can we please call and find out what they do approve?

## 2023-09-21 NOTE — Telephone Encounter (Signed)
Patient was denied for the Sumatriptan via her insurance via CoverMyMeds. Patient was approved for [employer] Please advise?

## 2023-09-21 NOTE — Telephone Encounter (Signed)
Spoke with [employer] and was informed patient's insurance is only allowing 9 tablets per 30 days. Patient was notified via MyChart. Advised to reach out if she has any questions or concerns.

## 2023-09-22 ENCOUNTER — Telehealth: Payer: Self-pay

## 2023-09-22 NOTE — Telephone Encounter (Signed)
-----   Message from [facility] Adell L sent at 09/22/2023 12:50 PM EST ----- Regarding: TOC Call Hi there,  Please complete a TOC call for patient.   Reason: Headache, unspecified Discharged: 09/17/2023 Location: [facility] - Emergency  Please let me know when completed.  Thank you, Florentina Addison

## 2023-09-22 NOTE — Transitions of Care (Post Inpatient/ED Visit) (Unsigned)
   09/22/2023  Name: Alexis Rangel MRN: [id number] DOB: 11-Oct-1985  Today's TOC FU Call Status: Today's TOC FU Call Status:: Unsuccessful Call (1st Attempt) TOC FU Call Complete Date: 09/22/23  Attempted to reach the patient regarding the most recent Inpatient/ED visit.  Follow Up Plan: Additional outreach attempts will be made to reach the patient to complete the Transitions of Care (Post Inpatient/ED visit) call.   Signature Malen Gauze, [place]

## 2023-09-23 ENCOUNTER — Telehealth: Payer: Self-pay

## 2023-09-23 NOTE — Transitions of Care (Post Inpatient/ED Visit) (Signed)
   09/23/2023  Name: Alexis Rangel MRN: [id number] DOB: 1986/06/23  Today's TOC FU Call Status: Today's TOC FU Call Status:: Successful TOC FU Call Completed TOC FU Call Complete Date: 09/22/23 Patient's Name and Date of Birth confirmed.  Transition Care Management Follow-up Telephone Call Date of Discharge: 09/17/23 Discharge [facility] Type of Discharge: Emergency Department Reason for ED Visit: Other: How have you been since you were released from the hospital?: Worse Any questions or concerns?: No  Items Reviewed: Did you receive and understand the discharge instructions provided?: No Medications obtained,verified, and reconciled?: Yes (Medications Reviewed) Any new allergies since your discharge?: No Dietary orders reviewed?: No Do you have support at home?: No  Medications Reviewed Today: Medications Reviewed Today   Medications were not reviewed in this encounter     Home Care and Equipment/Supplies: Were Home Health Services Ordered?: NA Any new equipment or medical supplies ordered?: NA  Functional Questionnaire: Do you need assistance with bathing/showering or dressing?: No Do you need assistance with meal preparation?: No Do you need assistance with eating?: No Do you have difficulty maintaining continence: No Do you need assistance with getting out of bed/getting out of a chair/moving?: No Do you have difficulty managing or taking your medications?: No  Follow up appointments reviewed: PCP Follow-up appointment confirmed?: No MD Provider Line Number:[phone] Given: Yes Specialist Hospital Follow-up appointment confirmed?: NA Do you need transportation to your follow-up appointment?: No Do you understand care options if your condition(s) worsen?: Yes-patient verbalized understanding    SIGNATURE Malen Gauze, CMA

## 2023-09-23 NOTE — Telephone Encounter (Signed)
-----   Message from [facility] Adell L sent at 09/22/2023 12:50 PM EST ----- Regarding: TOC Call Hi there,  Please complete a TOC call for patient.   Reason: Headache, unspecified Discharged: 09/17/2023 Location: [facility] - Emergency  Please let me know when completed.  Thank you, Florentina Addison
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
## 2023-09-24 ENCOUNTER — Encounter: Payer: Self-pay | Admitting: Family Medicine

## 2023-09-24 ENCOUNTER — Telehealth (INDEPENDENT_AMBULATORY_CARE_PROVIDER_SITE_OTHER): Payer: Managed Care, Other (non HMO) | Admitting: Family Medicine

## 2023-09-24 ENCOUNTER — Telehealth: Payer: Self-pay | Admitting: Family Medicine

## 2023-09-24 DIAGNOSIS — G43109 Migraine with aura, not intractable, without status migrainosus: Secondary | ICD-10-CM

## 2023-09-24 DIAGNOSIS — F3181 Bipolar II disorder: Secondary | ICD-10-CM | POA: Diagnosis not present

## 2023-09-24 DIAGNOSIS — G43909 Migraine, unspecified, not intractable, without status migrainosus: Secondary | ICD-10-CM

## 2023-09-24 MED ORDER — LURASIDONE HCL 20 MG PO TABS: 20.0000 mg | ORAL_TABLET | Freq: Every day | ORAL | 3 refills | Status: DC

## 2023-09-24 NOTE — Telephone Encounter (Signed)
Good Rx available for about $9

## 2023-09-24 NOTE — Telephone Encounter (Signed)
Initiated a prior authorization for the patient Alexis Rangel prescription. It cancelled due to the medication being covered on the insurance's formulary. Reached out to patient's local [facility] pharmacy and had them to run the prescription and was informed that it went through but the patient is to pay $272 out of pocket for the medication. Please advise?

## 2023-09-24 NOTE — Telephone Encounter (Signed)
This was likely too fast for a PA? Did we do one?

## 2023-09-24 NOTE — Telephone Encounter (Signed)
Called patient to make an appointment  she stated that the  RX for LATUDA is very expensive and would like  to know if there was another option fot he this medication. Please advise

## 2023-09-24 NOTE — Progress Notes (Signed)
There were no vitals taken for this visit.   Subjective:    Patient ID: Alexis Rangel, female    DOB: Oct 18, 1985, 38 y.o.   MRN: [id number]  HPI: Alexis Rangel is a 38 y.o. female  Chief Complaint  Patient presents with   Hospitalization Follow-up   Migraine   ER FOLLOW UP Time since discharge: 1 week Hospital/facility: [facility] Diagnosis: migraine Procedures/tests: Labs  Consultants: none New medications: migraine cocktail Discharge instructions:  follow up here and with neurology Status: better  DEPRESSION Mood status: worse Satisfied with current treatment?: no Symptom severity: severe  Duration of current treatment :  not currently on anything Side effects: no Medication compliance: good compliance Psychotherapy/counseling: no  Previous psychiatric medications: latuda, seroquel Depressed mood: yes Anxious mood: yes Anhedonia: no Significant weight loss or gain: no Insomnia: yes hard to fall asleep Fatigue: yes Feelings of worthlessness or guilt: yes Impaired concentration/indecisiveness: yes Suicidal ideations: no Hopelessness: yes Crying spells: no    09/16/2023    9:43 AM 09/11/2023    1:02 PM 12/11/2022    9:09 AM 11/11/2022    9:57 AM 10/16/2022    3:49 PM  Depression screen PHQ 2/9  Decreased Interest 1 0 2 1 2   Down, Depressed, Hopeless 0 1 3 2 1   PHQ - 2 Score 1 1 5 3 3   Altered sleeping 0 0 2 1 0  Tired, decreased energy 0 1 1 1 1   Change in appetite 1 2 3 2 1   Feeling bad or failure about yourself  1 0 2 1 2   Trouble concentrating 0 0 1 0 0  Moving slowly or fidgety/restless 0 0 0 0 0  Suicidal thoughts 0 0 0 0 0  PHQ-9 Score 3 4 14 8 7   Difficult doing work/chores Not difficult at all Not difficult at all Not difficult at all Not difficult at all Not difficult at all      09/16/2023    9:44 AM 09/11/2023    1:02 PM 12/11/2022    9:09 AM 11/11/2022    9:57 AM  GAD 7 : Generalized Anxiety Score  Nervous, Anxious, on Edge  1 1 1 1   Control/stop worrying 1 1 2 1   Worry too much - different things 1 1 2 2   Trouble relaxing 0 0 1 1  Restless 0 0 1 0  Easily annoyed or irritable 1 1 3 2   Afraid - awful might happen 0 0 0 0  Total GAD 7 Score 4 4 10 7   Anxiety Difficulty Not difficult at all Not difficult at all Not difficult at all Not difficult at all     Relevant past medical, surgical, family and social history reviewed and updated as indicated. Interim medical history since our last visit reviewed. Allergies and medications reviewed and updated.  Review of Systems  Constitutional: Negative.   Respiratory: Negative.    Cardiovascular: Negative.   Musculoskeletal: Negative.   Neurological:  Positive for headaches. Negative for dizziness, tremors, seizures, syncope, facial asymmetry, speech difficulty, weakness, light-headedness and numbness.  Psychiatric/Behavioral:  Positive for dysphoric mood and sleep disturbance. Negative for agitation, behavioral problems, confusion, decreased concentration, hallucinations, self-injury and suicidal ideas. The patient is nervous/anxious. The patient is not hyperactive.     Per HPI unless specifically indicated above     Objective:    There were no vitals taken for this visit.  Wt Readings from Last 3 Encounters:  09/16/23 229 lb 9.6 oz (104.1 kg)  09/11/23 228 lb 12.8 oz (103.8 kg)  05/22/23 233 lb 4 oz (105.8 kg)    Physical Exam Vitals and nursing note reviewed.  Constitutional:      General: She is not in acute distress.    Appearance: Normal appearance. She is not ill-appearing, toxic-appearing or diaphoretic.  HENT:     Head: Normocephalic and atraumatic.     Right Ear: External ear normal.     Left Ear: External ear normal.     Nose: Nose normal.     Mouth/Throat:     Mouth: Mucous membranes are moist.     Pharynx: Oropharynx is clear.  Eyes:     General: No scleral icterus.       Right eye: No discharge.        Left eye: No discharge.      Conjunctiva/sclera: Conjunctivae normal.     Pupils: Pupils are equal, round, and reactive to light.  Pulmonary:     Effort: Pulmonary effort is normal. No respiratory distress.     Comments: Speaking in full sentences Musculoskeletal:        General: Normal range of motion.     Cervical back: Normal range of motion.  Skin:    Coloration: Skin is not jaundiced or pale.     Findings: No bruising, erythema, lesion or rash.  Neurological:     Mental Status: She is alert and oriented to person, place, and time. Mental status is at baseline.  Psychiatric:        Mood and Affect: Mood normal.        Behavior: Behavior normal.        Thought Content: Thought content normal.        Judgment: Judgment normal.     Results for orders placed or performed in visit on 11/11/22  Microscopic Examination   Collection Time: 11/11/22 10:12 AM   Urine  Result Value Ref Range   WBC, UA 0-5 0 - 5 /hpf   RBC, Urine 0-2 0 - 2 /hpf   Epithelial Cells (non renal) 0-10 0 - 10 /hpf   Bacteria, UA None seen None seen/Few  Urinalysis, Routine w reflex microscopic   Collection Time: 11/11/22 10:12 AM  Result Value Ref Range   Specific Gravity, UA 1.010 1.005 - 1.030   pH, UA 7.0 5.0 - 7.5   Color, UA Yellow Yellow   Appearance Ur Clear Clear   Leukocytes,UA Trace (A) Negative   Protein,UA Negative Negative/Trace   Glucose, UA Negative Negative   Ketones, UA Negative Negative   RBC, UA Negative Negative   Bilirubin, UA Negative Negative   Urobilinogen, Ur 0.2 0.2 - 1.0 mg/dL   Nitrite, UA Negative Negative   Microscopic Examination See below:   CBC with Differential/Platelet   Collection Time: 11/11/22 10:13 AM  Result Value Ref Range   WBC 11.6 (H) 3.4 - 10.8 x10E3/uL   RBC 4.62 3.77 - 5.28 x10E6/uL   Hemoglobin 12.4 11.1 - 15.9 g/dL   Hematocrit 09.8 11.9 - 46.6 %   MCV 85 79 - 97 fL   MCH 26.8 26.6 - 33.0 pg   MCHC 31.5 31.5 - 35.7 g/dL   RDW 14.7 82.9 - 56.2 %   Platelets 454 (H) 150 -  450 x10E3/uL   Neutrophils 64 Not Estab. %   Lymphs 29 Not Estab. %   Monocytes 5 Not Estab. %   Eos 1 Not Estab. %   Basos 1 Not Estab. %  Neutrophils Absolute 7.4 (H) 1.4 - 7.0 x10E3/uL   Lymphocytes Absolute 3.4 (H) 0.7 - 3.1 x10E3/uL   Monocytes Absolute 0.5 0.1 - 0.9 x10E3/uL   EOS (ABSOLUTE) 0.1 0.0 - 0.4 x10E3/uL   Basophils Absolute 0.1 0.0 - 0.2 x10E3/uL   Immature Granulocytes 0 Not Estab. %   Immature Grans (Abs) 0.0 0.0 - 0.1 x10E3/uL  Comprehensive metabolic panel   Collection Time: 11/11/22 10:13 AM  Result Value Ref Range   Glucose 79 70 - 99 mg/dL   BUN 9 6 - 20 mg/dL   Creatinine, Ser 9.60 0.57 - 1.00 mg/dL   eGFR 454 >09 WJ/XBJ/4.78   BUN/Creatinine Ratio 14 9 - 23   Sodium 140 134 - 144 mmol/L   Potassium 4.0 3.5 - 5.2 mmol/L   Chloride 103 96 - 106 mmol/L   CO2 22 20 - 29 mmol/L   Calcium 9.1 8.7 - 10.2 mg/dL   Total Protein 6.6 6.0 - 8.5 g/dL   Albumin 4.2 3.9 - 4.9 g/dL   Globulin, Total 2.4 1.5 - 4.5 g/dL   Albumin/Globulin Ratio 1.8 1.2 - 2.2   Bilirubin Total <0.2 0.0 - 1.2 mg/dL   Alkaline Phosphatase 104 44 - 121 IU/L   AST 14 0 - 40 IU/L   ALT 19 0 - 32 IU/L  Lipid Panel w/o Chol/HDL Ratio   Collection Time: 11/11/22 10:13 AM  Result Value Ref Range   Cholesterol, Total 182 100 - 199 mg/dL   Triglycerides 295 0 - 149 mg/dL   HDL 45 >62 mg/dL   VLDL Cholesterol Cal 19 5 - 40 mg/dL   LDL Chol Calc (NIH) 130 (H) 0 - 99 mg/dL  TSH   Collection Time: 11/11/22 10:13 AM  Result Value Ref Range   TSH 2.390 0.450 - 4.500 uIU/mL  B12   Collection Time: 11/11/22 10:13 AM  Result Value Ref Range   Vitamin B-12 151 (L) 232 - 1,245 pg/mL      Assessment & Plan:   Problem List Items Addressed This Visit       Cardiovascular and Mediastinum   Migraine   Encouraged her to take her emgality shot and follow up with neurology. Continue to monitor. Call with any concerns.         Other   Bipolar 2 disorder (HCC) - Primary   Not under good  control. Will get her back on her latuda and recheck in 2-3 weeks. Call with any concerns.         Follow up plan: Return 2-3 weeks, for virtual OK.   This visit was completed via video visit through MyChart due to the restrictions of the COVID-19 pandemic. All issues as above were discussed and addressed. Physical exam was done as above through visual confirmation on video through MyChart. If it was felt that the patient should be evaluated in the office, they were directed there. The patient verbally consented to this visit. Location of the patient: home Location of the provider: work Those involved with this call:  Provider: Olevia Perches, DO CMA: Malen Gauze, CMA Front Desk/Registration:  Servando Snare   Time spent on call:  25 minutes with patient face to face via video conference. More than 50% of this time was spent in counseling and coordination of care. 40 minutes total spent in review of patient's record and preparation of their chart.

## 2023-09-27 NOTE — Assessment & Plan Note (Signed)
Encouraged her to take her emgality shot and follow up with neurology. Continue to monitor. Call with any concerns.

## 2023-09-27 NOTE — Assessment & Plan Note (Signed)
Not under good control. Will get her back on her latuda and recheck in 2-3 weeks. Call with any concerns.

## 2023-10-12 ENCOUNTER — Other Ambulatory Visit: Payer: Self-pay | Admitting: Family Medicine

## 2023-10-12 NOTE — Telephone Encounter (Signed)
 Requested medication (s) are due for refill today: Yes  Requested medication (s) are on the active medication list: Yes  Last refill:  09/11/23  Future visit scheduled: Yes  Notes to clinic:  Unable to refill due to no refill protocol for this medication.      Requested Prescriptions  Pending Prescriptions Disp Refills   EMGALITY  120 MG/ML SOAJ [Pharmacy Med Name: Emgality  120 MG/ML Subcutaneous Solution Auto-injector] 2 mL 0    Sig: INJECT 240MG  INTO THE SKIN ONCE FOR LOADING DOSE     Off-Protocol Failed - 10/12/2023  1:27 PM      Failed - Medication not assigned to a protocol, review manually.      Passed - Valid encounter within last 12 months    Recent Outpatient Visits           2 weeks ago Bipolar 2 disorder (HCC)   [place] [facility] [place], Megan P, DO   3 weeks ago Migraine with aura and without status migrainosus, not intractable   [place] [facility] [place], Megan P, DO   1 month ago Bipolar 2 disorder [facility])   [place] [facility] [place], Megan P, DO   10 months ago Bipolar 2 disorder [facility])   [place] [facility] [place], Megan P, DO   11 months ago Routine general medical examination at a health care facility   [facility] Solomon Dupre, DO       Future Appointments             In 4 days Solomon Dupre, DO [facility]   In 1 month [place] Renshaw, Jerilee Montane, DO [facility], [facility]

## 2023-10-16 ENCOUNTER — Telehealth: Payer: Self-pay | Admitting: Family Medicine

## 2023-10-19 ENCOUNTER — Other Ambulatory Visit: Payer: Self-pay | Admitting: Family Medicine

## 2023-10-20 NOTE — Telephone Encounter (Signed)
 Requested medication (s) are due for refill today: Yes  Requested medication (s) are on the active medication list: Yes  Last refill:  09/09/22  Future visit scheduled: Yes  Notes to clinic:  Manual review.    Requested Prescriptions  Pending Prescriptions Disp Refills   ibuprofen (ADVIL) 800 MG tablet [Pharmacy Med Name: Ibuprofen 800 MG Oral Tablet] 90 tablet 0    Sig: TAKE 1 TABLET BY MOUTH EVERY 8 HOURS AS NEEDED     Analgesics:  NSAIDS Failed - 10/20/2023  2:49 PM      Failed - Manual Review: Labs are only required if the patient has taken medication for more than 8 weeks.      Failed - PLT in normal range and within 360 days    Platelets  Date Value Ref Range Status  08/07/2023 520 (H) 150 - 400 K/uL Final  11/11/2022 454 (H) 150 - 450 x10E3/uL Final         Passed - Cr in normal range and within 360 days    Creatinine  Date Value Ref Range Status  09/04/2014 0.67 0.60 - 1.30 mg/dL Final   Creatinine, Ser  Date Value Ref Range Status  08/07/2023 0.70 0.44 - 1.00 mg/dL Final         Passed - HGB in normal range and within 360 days    Hemoglobin  Date Value Ref Range Status  08/07/2023 12.4 12.0 - 15.0 g/dL Final  60/45/4098 11.9 11.1 - 15.9 g/dL Final         Passed - HCT in normal range and within 360 days    HCT  Date Value Ref Range Status  08/07/2023 39.1 36.0 - 46.0 % Final   Hematocrit  Date Value Ref Range Status  11/11/2022 39.4 34.0 - 46.6 % Final         Passed - eGFR is 30 or above and within 360 days    EGFR (African American)  Date Value Ref Range Status  09/04/2014 >60 >17mL/min Final  05/03/2013 >60  Final   GFR calc Af Amer  Date Value Ref Range Status  03/08/2018 >60 >60 mL/min Final    Comment:    (NOTE) The eGFR has been calculated using the CKD EPI equation. This calculation has not been validated in all clinical situations. eGFR's persistently <60 mL/min signify possible Chronic Kidney Disease.    EGFR (Non-African Amer.)   Date Value Ref Range Status  09/04/2014 >60 >38mL/min Final    Comment:    eGFR values <67mL/min/1.73 m2 may be an indication of chronic kidney disease (CKD). Calculated eGFR, using the MRDR Study equation, is useful in  patients with stable renal function. The eGFR calculation will not be reliable in acutely ill patients when serum creatinine is changing rapidly. It is not useful in patients on dialysis. The eGFR calculation may not be applicable to patients at the low and high extremes of body sizes, pregnant women, and vegetarians.   05/03/2013 >60  Final    Comment:    eGFR values <65mL/min/1.73 m2 may be an indication of chronic kidney disease (CKD). Calculated eGFR is useful in patients with stable renal function. The eGFR calculation will not be reliable in acutely ill patients when serum creatinine is changing rapidly. It is not useful in  patients on dialysis. The eGFR calculation may not be applicable to patients at the low and high extremes of body sizes, pregnant women, and vegetarians.    GFR, Estimated  Date Value Ref  Range Status  08/07/2023 >60 >60 mL/min Final    Comment:    (NOTE) Calculated using the CKD-EPI Creatinine Equation (2021)    eGFR  Date Value Ref Range Status  11/11/2022 119 >59 mL/min/1.73 Final         Passed - Patient is not pregnant      Passed - Valid encounter within last 12 months    Recent Outpatient Visits           3 weeks ago Bipolar 2 disorder (HCC)   [facility] [place], Megan P, DO   1 month ago Migraine with aura and without status migrainosus, not intractable   [place] [facility] [place], Megan P, DO   1 month ago Bipolar 2 disorder [facility])   [place] [facility] [place], Megan P, DO   10 months ago Bipolar 2 disorder [facility])   [place] [facility] [place], Megan P, DO   11 months ago Routine general medical examination at a health care facility    [facility] Dorcas Carrow, DO       Future Appointments             In 3 weeks Dorcas Carrow, DO [place] [facility], [facility]

## 2023-11-13 ENCOUNTER — Ambulatory Visit: Payer: Self-pay | Admitting: Family Medicine

## 2023-11-16 ENCOUNTER — Telehealth: Admitting: Neurosurgery

## 2023-12-30 ENCOUNTER — Encounter: Payer: Self-pay | Admitting: Family Medicine

## 2023-12-30 ENCOUNTER — Ambulatory Visit: Admitting: Family Medicine

## 2023-12-30 VITALS — BP 112/79 | HR 72 | Ht 62.0 in | Wt 233.4 lb

## 2023-12-30 DIAGNOSIS — G43109 Migraine with aura, not intractable, without status migrainosus: Secondary | ICD-10-CM | POA: Diagnosis not present

## 2023-12-30 DIAGNOSIS — F3181 Bipolar II disorder: Secondary | ICD-10-CM

## 2023-12-30 MED ORDER — KETOROLAC TROMETHAMINE 60 MG/2ML IM SOLN
60.0000 mg | Freq: Once | INTRAMUSCULAR | Status: AC
Start: 2023-12-30 — End: 2023-12-30
  Administered 2023-12-30: 60 mg via INTRAMUSCULAR

## 2023-12-30 MED ORDER — PROMETHAZINE HCL 50 MG/ML IJ SOLN
25.0000 mg | Freq: Once | INTRAMUSCULAR | Status: DC
Start: 2023-12-30 — End: 2023-12-30

## 2023-12-30 MED ORDER — SUMATRIPTAN SUCCINATE 50 MG PO TABS: 50.0000 mg | ORAL_TABLET | ORAL | 6 refills | Status: DC | PRN

## 2023-12-30 MED ORDER — ONDANSETRON 4 MG PO TBDP
4.0000 mg | ORAL_TABLET | Freq: Once | ORAL | Status: DC
Start: 2023-12-30 — End: 2023-12-30

## 2023-12-30 MED ORDER — ONDANSETRON HCL 4 MG PO TABS
4.0000 mg | ORAL_TABLET | Freq: Once | ORAL | Status: AC
Start: 2023-12-30 — End: 2023-12-30
  Administered 2023-12-30: 4 mg via ORAL

## 2023-12-30 MED ORDER — LURASIDONE HCL 40 MG PO TABS: 40.0000 mg | ORAL_TABLET | Freq: Every day | ORAL | 3 refills | Status: DC

## 2023-12-30 NOTE — Assessment & Plan Note (Signed)
 Improved, but not there. Will increase her to 40mg  and recheck in 6 weeks. Call with any concerns. Continue to monitor.

## 2023-12-30 NOTE — Assessment & Plan Note (Signed)
 In exacerbation. Will treat with toradol  shot. Continue emgality . Call with any concerns.

## 2023-12-30 NOTE — Addendum Note (Signed)
 Addended by: Georgios Kina M on: 12/30/2023 11:43 AM   Modules accepted: Orders

## 2023-12-30 NOTE — Addendum Note (Signed)
 Addended by: Sayge Salvato T on: 12/30/2023 11:54 AM   Modules accepted: Orders

## 2023-12-30 NOTE — Progress Notes (Signed)
 BP 112/79 (BP Location: Left Arm, Patient Position: Sitting, Cuff Size: Large)   Pulse 72   Ht 5\' 2"  (1.575 m)   Wt 233 lb 6.4 oz (105.9 kg)   SpO2 96%   BMI 42.69 kg/m    Subjective:    Patient ID: Alexis Rangel, female    DOB: 11/16/1985, 38 y.o.   MRN: [id number]  HPI: Alexis Rangel is a 38 y.o. female  Chief Complaint  Patient presents with   Migraine    Pt complains of having migraines since Friday. OTC meds no relief.    Has had migraine since Friday- has not had her imitrex . In severe pain.   BIPOLAR Mood status: better Satisfied with current treatment?: no Symptom severity: moderate  Duration of current treatment : months Side effects: no Medication compliance: good compliance Psychotherapy/counseling: no  Previous psychiatric medications: latuda  Depressed mood: yes Anxious mood: yes Anhedonia: no Significant weight loss or gain: no Insomnia: no  Fatigue: yes Feelings of worthlessness or guilt: no Impaired concentration/indecisiveness: no Suicidal ideations: no Hopelessness: no Crying spells: no    12/30/2023   10:34 AM 09/16/2023    9:43 AM 09/11/2023    1:02 PM 12/11/2022    9:09 AM 11/11/2022    9:57 AM  Depression screen PHQ 2/9  Decreased Interest 1 1 0 2 1  Down, Depressed, Hopeless 1 0 1 3 2   PHQ - 2 Score 2 1 1 5 3   Altered sleeping 2 0 0 2 1  Tired, decreased energy 1 0 1 1 1   Change in appetite 0 1 2 3 2   Feeling bad or failure about yourself  0 1 0 2 1  Trouble concentrating 1 0 0 1 0  Moving slowly or fidgety/restless 0 0 0 0 0  Suicidal thoughts 0 0 0 0 0  PHQ-9 Score 6 3 4 14 8   Difficult doing work/chores Not difficult at all Not difficult at all Not difficult at all Not difficult at all Not difficult at all    Relevant past medical, surgical, family and social history reviewed and updated as indicated. Interim medical history since our last visit reviewed. Allergies and medications reviewed and updated.  Review of  Systems  Constitutional: Negative.   Respiratory: Negative.    Cardiovascular: Negative.   Musculoskeletal: Negative.   Neurological:  Positive for headaches. Negative for dizziness, tremors, seizures, syncope, facial asymmetry, speech difficulty, weakness, light-headedness and numbness.  Psychiatric/Behavioral:  Positive for dysphoric mood. Negative for agitation, behavioral problems, confusion, decreased concentration, hallucinations, self-injury, sleep disturbance and suicidal ideas. The patient is nervous/anxious. The patient is not hyperactive.     Per HPI unless specifically indicated above     Objective:    BP 112/79 (BP Location: Left Arm, Patient Position: Sitting, Cuff Size: Large)   Pulse 72   Ht 5\' 2"  (1.575 m)   Wt 233 lb 6.4 oz (105.9 kg)   SpO2 96%   BMI 42.69 kg/m   Wt Readings from Last 3 Encounters:  12/30/23 233 lb 6.4 oz (105.9 kg)  09/16/23 229 lb 9.6 oz (104.1 kg)  09/11/23 228 lb 12.8 oz (103.8 kg)    Physical Exam Vitals and nursing note reviewed.  Constitutional:      General: She is not in acute distress.    Appearance: Normal appearance. She is obese. She is not ill-appearing, toxic-appearing or diaphoretic.  HENT:     Head: Normocephalic and atraumatic.     Right Ear: External ear  normal.     Left Ear: External ear normal.     Nose: Nose normal.     Mouth/Throat:     Mouth: Mucous membranes are moist.     Pharynx: Oropharynx is clear.  Eyes:     General: No scleral icterus.       Right eye: No discharge.        Left eye: No discharge.     Extraocular Movements: Extraocular movements intact.     Conjunctiva/sclera: Conjunctivae normal.     Pupils: Pupils are equal, round, and reactive to light.  Cardiovascular:     Rate and Rhythm: Normal rate and regular rhythm.     Pulses: Normal pulses.     Heart sounds: Normal heart sounds. No murmur heard.    No friction rub. No gallop.  Pulmonary:     Effort: Pulmonary effort is normal. No  respiratory distress.     Breath sounds: Normal breath sounds. No stridor. No wheezing, rhonchi or rales.  Chest:     Chest wall: No tenderness.  Musculoskeletal:        General: Normal range of motion.     Cervical back: Normal range of motion and neck supple.  Skin:    General: Skin is warm and dry.     Capillary Refill: Capillary refill takes less than 2 seconds.     Coloration: Skin is not jaundiced or pale.     Findings: No bruising, erythema, lesion or rash.  Neurological:     General: No focal deficit present.     Mental Status: She is alert and oriented to person, place, and time. Mental status is at baseline.  Psychiatric:        Mood and Affect: Mood normal.        Behavior: Behavior normal.        Thought Content: Thought content normal.        Judgment: Judgment normal.     Results for orders placed or performed in visit on 11/11/22  Microscopic Examination   Collection Time: 11/11/22 10:12 AM   Urine  Result Value Ref Range   WBC, UA 0-5 0 - 5 /hpf   RBC, Urine 0-2 0 - 2 /hpf   Epithelial Cells (non renal) 0-10 0 - 10 /hpf   Bacteria, UA None seen None seen/Few  Urinalysis, Routine w reflex microscopic   Collection Time: 11/11/22 10:12 AM  Result Value Ref Range   Specific Gravity, UA 1.010 1.005 - 1.030   pH, UA 7.0 5.0 - 7.5   Color, UA Yellow Yellow   Appearance Ur Clear Clear   Leukocytes,UA Trace (A) Negative   Protein,UA Negative Negative/Trace   Glucose, UA Negative Negative   Ketones, UA Negative Negative   RBC, UA Negative Negative   Bilirubin, UA Negative Negative   Urobilinogen, Ur 0.2 0.2 - 1.0 mg/dL   Nitrite, UA Negative Negative   Microscopic Examination See below:   CBC with Differential/Platelet   Collection Time: 11/11/22 10:13 AM  Result Value Ref Range   WBC 11.6 (H) 3.4 - 10.8 x10E3/uL   RBC 4.62 3.77 - 5.28 x10E6/uL   Hemoglobin 12.4 11.1 - 15.9 g/dL   Hematocrit 09.8 11.9 - 46.6 %   MCV 85 79 - 97 fL   MCH 26.8 26.6 - 33.0 pg    MCHC 31.5 31.5 - 35.7 g/dL   RDW 14.7 82.9 - 56.2 %   Platelets 454 (H) 150 - 450 x10E3/uL   Neutrophils 64  Not Estab. %   Lymphs 29 Not Estab. %   Monocytes 5 Not Estab. %   Eos 1 Not Estab. %   Basos 1 Not Estab. %   Neutrophils Absolute 7.4 (H) 1.4 - 7.0 x10E3/uL   Lymphocytes Absolute 3.4 (H) 0.7 - 3.1 x10E3/uL   Monocytes Absolute 0.5 0.1 - 0.9 x10E3/uL   EOS (ABSOLUTE) 0.1 0.0 - 0.4 x10E3/uL   Basophils Absolute 0.1 0.0 - 0.2 x10E3/uL   Immature Granulocytes 0 Not Estab. %   Immature Grans (Abs) 0.0 0.0 - 0.1 x10E3/uL  Comprehensive metabolic panel   Collection Time: 11/11/22 10:13 AM  Result Value Ref Range   Glucose 79 70 - 99 mg/dL   BUN 9 6 - 20 mg/dL   Creatinine, Ser 1.61 0.57 - 1.00 mg/dL   eGFR 096 >04 VW/UJW/1.19   BUN/Creatinine Ratio 14 9 - 23   Sodium 140 134 - 144 mmol/L   Potassium 4.0 3.5 - 5.2 mmol/L   Chloride 103 96 - 106 mmol/L   CO2 22 20 - 29 mmol/L   Calcium 9.1 8.7 - 10.2 mg/dL   Total Protein 6.6 6.0 - 8.5 g/dL   Albumin 4.2 3.9 - 4.9 g/dL   Globulin, Total 2.4 1.5 - 4.5 g/dL   Albumin/Globulin Ratio 1.8 1.2 - 2.2   Bilirubin Total <0.2 0.0 - 1.2 mg/dL   Alkaline Phosphatase 104 44 - 121 IU/L   AST 14 0 - 40 IU/L   ALT 19 0 - 32 IU/L  Lipid Panel w/o Chol/HDL Ratio   Collection Time: 11/11/22 10:13 AM  Result Value Ref Range   Cholesterol, Total 182 100 - 199 mg/dL   Triglycerides 147 0 - 149 mg/dL   HDL 45 >82 mg/dL   VLDL Cholesterol Cal 19 5 - 40 mg/dL   LDL Chol Calc (NIH) 956 (H) 0 - 99 mg/dL  TSH   Collection Time: 11/11/22 10:13 AM  Result Value Ref Range   TSH 2.390 0.450 - 4.500 uIU/mL  B12   Collection Time: 11/11/22 10:13 AM  Result Value Ref Range   Vitamin B-12 151 (L) 232 - 1,245 pg/mL      Assessment & Plan:   Problem List Items Addressed This Visit       Cardiovascular and Mediastinum   Migraine   In exacerbation. Will treat with toradol  shot. Continue emgality . Call with any concerns.       Relevant  Medications   ketorolac  (TORADOL ) injection 60 mg   SUMAtriptan  (IMITREX ) 50 MG tablet     Other   Bipolar 2 disorder (HCC) - Primary   Improved, but not there. Will increase her to 40mg  and recheck in 6 weeks. Call with any concerns. Continue to monitor.         Follow up plan: Return in about 6 weeks (around 02/10/2024).

## 2023-12-31 ENCOUNTER — Encounter: Payer: Self-pay | Admitting: Family Medicine

## 2023-12-31 NOTE — Telephone Encounter (Signed)
Note on her mychart.

## 2024-01-07 ENCOUNTER — Ambulatory Visit
Admission: EM | Admit: 2024-01-07 | Discharge: 2024-01-07 | Disposition: A | Discharge status: Home / Self Care | Attending: Family Medicine | Admitting: Family Medicine

## 2024-01-07 DIAGNOSIS — S161XXA Strain of muscle, fascia and tendon at neck level, initial encounter: Secondary | ICD-10-CM | POA: Diagnosis not present

## 2024-01-07 MED ORDER — CYCLOBENZAPRINE HCL 5 MG PO TABS: 5.0000 mg | ORAL_TABLET | Freq: Three times a day (TID) | ORAL | 0 refills | Status: AC | PRN

## 2024-01-07 MED ORDER — NAPROXEN 500 MG PO TABS: 500.0000 mg | ORAL_TABLET | Freq: Two times a day (BID) | ORAL | 0 refills | Status: DC
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
## 2024-01-14 ENCOUNTER — Ambulatory Visit (INDEPENDENT_AMBULATORY_CARE_PROVIDER_SITE_OTHER): Admitting: Nurse Practitioner

## 2024-01-14 ENCOUNTER — Encounter: Payer: Self-pay | Admitting: Nurse Practitioner

## 2024-01-14 VITALS — BP 114/75 | HR 77 | Temp 98.4°F | Resp 15 | Ht 62.01 in | Wt 232.4 lb

## 2024-01-14 DIAGNOSIS — R519 Headache, unspecified: Secondary | ICD-10-CM | POA: Diagnosis not present

## 2024-01-14 MED ORDER — NAPROXEN 500 MG PO TABS: 500.0000 mg | ORAL_TABLET | Freq: Two times a day (BID) | ORAL | 0 refills | Status: AC

## 2024-01-14 MED ORDER — FLUTICASONE PROPIONATE 50 MCG/ACT NA SUSP: 2.0000 | Freq: Every day | NASAL | 6 refills | Status: AC

## 2024-01-14 NOTE — Progress Notes (Signed)
 BP 114/75 (BP Location: Left Arm, Patient Position: Sitting, Cuff Size: Large)   Pulse 77   Temp 98.4 F (36.9 C) (Oral)   Resp 15   Ht 5' 2.01" (1.575 m)   Wt 232 lb 6.4 oz (105.4 kg)   LMP  (LMP Unknown)   SpO2 98%   BMI 42.50 kg/m    Subjective:    Patient ID: Alexis Rangel, female    DOB: [birth year], 38 y.o.   MRN: [id number]  HPI: Alexis Rangel is a 38 y.o. female  Chief Complaint  Patient presents with   Headache    Started frontal sinus and goes back into her neck. Started on Tuesday.    Patient states she has a headache that started two days ago.  States she is having a lumbar puncture in August.  She is still taking Emgality .  She has used her 800mg  ibuprofen  which has not improved her symptoms.  Feels like her ears are ringing.  Doesn't feel congested, ear pain, fevers, coughing.  Having a little bit of pain with pressing on her sinuses.  Not currently using any zyrtec/allegra.      Relevant past medical, surgical, family and social history reviewed and updated as indicated. Interim medical history since our last visit reviewed. Allergies and medications reviewed and updated.  Review of Systems  HENT:  Positive for sinus pressure.   Eyes:        Ears ringing  Neurological:  Positive for headaches.    Per HPI unless specifically indicated above     Objective:     BP 114/75 (BP Location: Left Arm, Patient Position: Sitting, Cuff Size: Large)   Pulse 77   Temp 98.4 F (36.9 C) (Oral)   Resp 15   Ht 5' 2.01" (1.575 m)   Wt 232 lb 6.4 oz (105.4 kg)   LMP  (LMP Unknown)   SpO2 98%   BMI 42.50 kg/m   Wt Readings from Last 3 Encounters:  01/14/24 232 lb 6.4 oz (105.4 kg)  01/07/24 233 lb (105.7 kg)  12/30/23 233 lb 6.4 oz (105.9 kg)    Physical Exam Vitals and nursing note reviewed.  Constitutional:      General: She is not in acute distress.    Appearance: Normal appearance. She is well-developed. She is not ill-appearing,  toxic-appearing or diaphoretic.  HENT:     Head: Normocephalic.     Right Ear: External ear normal.     Left Ear: External ear normal.     Nose: Nose normal.     Mouth/Throat:     Mouth: Mucous membranes are moist.     Pharynx: Oropharynx is clear.  Eyes:     General:        Right eye: No discharge.        Left eye: No discharge.     Extraocular Movements: Extraocular movements intact.     Conjunctiva/sclera: Conjunctivae normal.     Pupils: Pupils are equal, round, and reactive to light.  Cardiovascular:     Rate and Rhythm: Normal rate and regular rhythm.     Heart sounds: No murmur heard. Pulmonary:     Effort: Pulmonary effort is normal. No respiratory distress.     Breath sounds: Normal breath sounds. No wheezing or rales.  Musculoskeletal:     Cervical back: Normal range of motion and neck supple.  Skin:    General: Skin is warm and dry.     Capillary Refill: Capillary refill  takes less than 2 seconds.  Neurological:     General: No focal deficit present.     Mental Status: She is alert and oriented to person, place, and time. Mental status is at baseline.  Psychiatric:        Mood and Affect: Mood normal.        Behavior: Behavior normal.        Thought Content: Thought content normal.        Judgment: Judgment normal.     Results for orders placed or performed in visit on 11/11/22  Microscopic Examination   Collection Time: 11/11/22 10:12 AM   Urine  Result Value Ref Range   WBC, UA 0-5 0 - 5 /hpf   RBC, Urine 0-2 0 - 2 /hpf   Epithelial Cells (non renal) 0-10 0 - 10 /hpf   Bacteria, UA None seen None seen/Few  Urinalysis, Routine w reflex microscopic   Collection Time: 11/11/22 10:12 AM  Result Value Ref Range   Specific Gravity, UA 1.010 1.005 - 1.030   pH, UA 7.0 5.0 - 7.5   Color, UA Yellow Yellow   Appearance Ur Clear Clear   Leukocytes,UA Trace (A) Negative   Protein,UA Negative Negative/Trace   Glucose, UA Negative Negative   Ketones, UA  Negative Negative   RBC, UA Negative Negative   Bilirubin, UA Negative Negative   Urobilinogen, Ur 0.2 0.2 - 1.0 mg/dL   Nitrite, UA Negative Negative   Microscopic Examination See below:   CBC with Differential/Platelet   Collection Time: 11/11/22 10:13 AM  Result Value Ref Range   WBC 11.6 (H) 3.4 - 10.8 x10E3/uL   RBC 4.62 3.77 - 5.28 x10E6/uL   Hemoglobin 12.4 11.1 - 15.9 g/dL   Hematocrit 96.0 45.4 - 46.6 %   MCV 85 79 - 97 fL   MCH 26.8 26.6 - 33.0 pg   MCHC 31.5 31.5 - 35.7 g/dL   RDW 09.8 11.9 - 14.7 %   Platelets 454 (H) 150 - 450 x10E3/uL   Neutrophils 64 Not Estab. %   Lymphs 29 Not Estab. %   Monocytes 5 Not Estab. %   Eos 1 Not Estab. %   Basos 1 Not Estab. %   Neutrophils Absolute 7.4 (H) 1.4 - 7.0 x10E3/uL   Lymphocytes Absolute 3.4 (H) 0.7 - 3.1 x10E3/uL   Monocytes Absolute 0.5 0.1 - 0.9 x10E3/uL   EOS (ABSOLUTE) 0.1 0.0 - 0.4 x10E3/uL   Basophils Absolute 0.1 0.0 - 0.2 x10E3/uL   Immature Granulocytes 0 Not Estab. %   Immature Grans (Abs) 0.0 0.0 - 0.1 x10E3/uL  Comprehensive metabolic panel   Collection Time: 11/11/22 10:13 AM  Result Value Ref Range   Glucose 79 70 - 99 mg/dL   BUN 9 6 - 20 mg/dL   Creatinine, Ser 8.29 0.57 - 1.00 mg/dL   eGFR 562 >13 YQ/MVH/8.46   BUN/Creatinine Ratio 14 9 - 23   Sodium 140 134 - 144 mmol/L   Potassium 4.0 3.5 - 5.2 mmol/L   Chloride 103 96 - 106 mmol/L   CO2 22 20 - 29 mmol/L   Calcium 9.1 8.7 - 10.2 mg/dL   Total Protein 6.6 6.0 - 8.5 g/dL   Albumin 4.2 3.9 - 4.9 g/dL   Globulin, Total 2.4 1.5 - 4.5 g/dL   Albumin/Globulin Ratio 1.8 1.2 - 2.2   Bilirubin Total <0.2 0.0 - 1.2 mg/dL   Alkaline Phosphatase 104 44 - 121 IU/L   AST 14 0 -  40 IU/L   ALT 19 0 - 32 IU/L  Lipid Panel w/o Chol/HDL Ratio   Collection Time: 11/11/22 10:13 AM  Result Value Ref Range   Cholesterol, Total 182 100 - 199 mg/dL   Triglycerides 096 0 - 149 mg/dL   HDL 45 >04 mg/dL   VLDL Cholesterol Cal 19 5 - 40 mg/dL   LDL Chol Calc  (NIH) 118 (H) 0 - 99 mg/dL  TSH   Collection Time: 11/11/22 10:13 AM  Result Value Ref Range   TSH 2.390 0.450 - 4.500 uIU/mL  B12   Collection Time: 11/11/22 10:13 AM  Result Value Ref Range   Vitamin B-12 151 (L) 232 - 1,245 pg/mL      Assessment & Plan:   Problem List Items Addressed This Visit   None Visit Diagnoses       Sinus headache    -  Primary   Recommend using Naproxen  PRN for headache.  Recommend using Zyrtec and flonase to help with symptoms.  Follow up if not improved.   Relevant Medications   naproxen  (NAPROSYN ) 500 MG tablet        Follow up plan: Return if symptoms worsen or fail to improve.

## 2024-02-10 ENCOUNTER — Encounter: Payer: Self-pay | Admitting: Emergency Medicine

## 2024-02-10 ENCOUNTER — Emergency Department

## 2024-02-10 ENCOUNTER — Other Ambulatory Visit: Payer: Self-pay

## 2024-02-10 ENCOUNTER — Emergency Department: Admission: EM | Admit: 2024-02-10 | Discharge: 2024-02-11 | Disposition: A | Discharge status: Home / Self Care

## 2024-02-10 DIAGNOSIS — Y92410 Unspecified street and highway as the place of occurrence of the external cause: Secondary | ICD-10-CM | POA: Diagnosis not present

## 2024-02-10 DIAGNOSIS — G8929 Other chronic pain: Secondary | ICD-10-CM | POA: Insufficient documentation

## 2024-02-10 DIAGNOSIS — M25511 Pain in right shoulder: Secondary | ICD-10-CM | POA: Diagnosis present

## 2024-02-10 LAB — POC URINE PREG, ED: Preg Test, Ur: NEGATIVE

## 2024-02-10 MED ORDER — IBUPROFEN 600 MG PO TABS
600.0000 mg | ORAL_TABLET | Freq: Once | ORAL | Status: AC
  Administered 2024-02-10: 600 mg via ORAL
  Filled 2024-02-10: qty 1
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
## 2024-02-10 NOTE — Discharge Instructions (Addendum)
 Been diagnosed with right shoulder degenerative changes consistent with arthritis.  Please take acetaminophen  650 mg every 8 hours for pain.  You can have a follow-up appointment with [facility].  Please come back to ED or go to your PCP if you have new symptoms or symptoms worsen

## 2024-02-10 NOTE — ED Triage Notes (Signed)
 Pt reports increasing right shoulder pain and pins and needles in her right neck.

## 2024-02-11 MED ORDER — ACETAMINOPHEN 650 MG RE SUPP: 650.0000 mg | RECTAL | 0 refills | Status: AC | PRN

## 2024-02-12 ENCOUNTER — Ambulatory Visit: Admitting: Family Medicine

## 2024-04-18 ENCOUNTER — Ambulatory Visit: Payer: Self-pay

## 2024-04-18 NOTE — Telephone Encounter (Signed)
 FYI Only or Action Required?: FYI only for provider.  Patient was last seen in primary care on 01/14/2024 by Melvin Pao, NP.  Called Nurse Triage reporting Migraine.  Symptoms began several days ago.  Interventions attempted: OTC medications: 800MG  Ibuprofen  and Rest, hydration, or home remedies.  Symptoms are: unchanged.  Triage Disposition: See Physician Within 24 Hours  Patient/caregiver understands and will follow disposition?: Yes  Copied from CRM #[id number]. Topic: Clinical - Red Word Triage >> Apr 18, 2024  3:54 PM Gennette ORN wrote: Red Word that prompted transfer to Nurse Triage: Patient is having severe migraine it has been going on since last Friday. Patient ears are also ringing  and vision blurry. Reason for Disposition  [1] MODERATE headache (e.g., interferes with normal activities) AND [2] present > 24 hours AND [3] unexplained  (Exceptions: Pain medicines not tried, typical migraine, or headache part of viral illness.)  Answer Assessment - Initial Assessment Questions States she has fluid built up and thinks that is contributing to the migraines. 800 mg Ibuprofen  with no relief.     1. LOCATION: Where does it hurt?      Whole head and face  2. ONSET: When did the headache start? (e.g., minutes, hours, days)      Friday  3. PATTERN: Does the pain come and go, or has it been constant since it started?      Constant 4. SEVERITY: How bad is the pain? and What does it keep you from doing?  (e.g., Scale 1-10; mild, moderate, or severe)     10/10  5. MIGRAINE: Have you been diagnosed with migraine headaches? If Yes, ask: Is this headache similar?      Yes  6. HEAD INJURY: Has there been any recent injury to your head?      No  7. OTHER SYMPTOMS: Do you have any other symptoms? (e.g., fever, stiff neck, eye pain, sore throat, cold symptoms)     Ears ringing, vision blurry  Protocols used: Headache-A-AH

## 2024-04-19 ENCOUNTER — Ambulatory Visit (INDEPENDENT_AMBULATORY_CARE_PROVIDER_SITE_OTHER): Payer: Self-pay | Admitting: Family Medicine

## 2024-04-19 ENCOUNTER — Encounter: Payer: Self-pay | Admitting: Family Medicine

## 2024-04-19 VITALS — BP 133/91 | HR 92 | Temp 98.4°F | Ht 62.0 in | Wt 238.0 lb

## 2024-04-19 DIAGNOSIS — G43109 Migraine with aura, not intractable, without status migrainosus: Secondary | ICD-10-CM

## 2024-04-19 DIAGNOSIS — G932 Benign intracranial hypertension: Secondary | ICD-10-CM

## 2024-04-19 DIAGNOSIS — F3181 Bipolar II disorder: Secondary | ICD-10-CM

## 2024-04-19 MED ORDER — KETOROLAC TROMETHAMINE 60 MG/2ML IM SOLN
60.0000 mg | Freq: Once | INTRAMUSCULAR | Status: AC
Start: 2024-04-19 — End: 2024-04-24

## 2024-04-19 MED ORDER — SUMATRIPTAN SUCCINATE 50 MG PO TABS: 50.0000 mg | ORAL_TABLET | ORAL | 6 refills | Status: AC | PRN

## 2024-04-19 MED ORDER — CARIPRAZINE HCL 1.5 MG PO CAPS: 1.5000 mg | ORAL_CAPSULE | ORAL | 0 refills | Status: DC

## 2024-04-19 NOTE — Progress Notes (Signed)
 BP (!) 133/91   Pulse 92   Temp 98.4 F (36.9 C) (Oral)   Ht 5' 2 (1.575 m)   Wt 238 lb (108 kg)   SpO2 98%   BMI 43.53 kg/m    Subjective:    Patient ID: Alexis Rangel, female    DOB: 03-16-1986, 38 y.o.   MRN: [id number]  HPI: Alexis Rangel is a 38 y.o. female  Chief Complaint  Patient presents with   Migraine    Pt states that she has been having migraines twice a week for the last 2 months. Has had a migraine for the last 48 hours. Has it since Friday.    Alexis Rangel has not been doing well. She lost her job and had issues with an assault charge against her which she is working on. She has been really depressed and has been off all her medicines due to cost. She notes that her head has been hurting a lot. She has had migraines at least 2x a week for the last 2 months- she has been on her emgality , but only has 1 pen left. She has been off her sumatriptan . She notes that she's having pressure in her face and having issues with her vision. She had to cancel her appointment with neurology for a lumbar puncture due to costs. She is otherwise doing well with no other concerns or complaints at this time.   Relevant past medical, surgical, family and social history reviewed and updated as indicated. Interim medical history since our last visit reviewed. Allergies and medications reviewed and updated.  Review of Systems  Constitutional: Negative.   Respiratory: Negative.    Cardiovascular: Negative.   Genitourinary: Negative.   Musculoskeletal: Negative.   Neurological:  Positive for headaches. Negative for dizziness, tremors, seizures, syncope, facial asymmetry, speech difficulty, weakness, light-headedness and numbness.  Psychiatric/Behavioral: Negative.      Per HPI unless specifically indicated above     Objective:    BP (!) 133/91   Pulse 92   Temp 98.4 F (36.9 C) (Oral)   Ht 5' 2 (1.575 m)   Wt 238 lb (108 kg)   SpO2 98%   BMI 43.53 kg/m   Wt Readings  from Last 3 Encounters:  04/19/24 238 lb (108 kg)  01/14/24 232 lb 6.4 oz (105.4 kg)  01/07/24 233 lb (105.7 kg)    Physical Exam Vitals and nursing note reviewed.  Constitutional:      General: She is not in acute distress.    Appearance: Normal appearance. She is not ill-appearing, toxic-appearing or diaphoretic.  HENT:     Head: Normocephalic and atraumatic.     Right Ear: External ear normal.     Left Ear: External ear normal.     Nose: Nose normal.     Mouth/Throat:     Mouth: Mucous membranes are moist.     Pharynx: Oropharynx is clear.  Eyes:     General: No scleral icterus.       Right eye: No discharge.        Left eye: No discharge.     Extraocular Movements: Extraocular movements intact.     Conjunctiva/sclera: Conjunctivae normal.     Pupils: Pupils are equal, round, and reactive to light.  Cardiovascular:     Rate and Rhythm: Normal rate and regular rhythm.     Pulses: Normal pulses.     Heart sounds: Normal heart sounds. No murmur heard.    No friction rub. No  gallop.  Pulmonary:     Effort: Pulmonary effort is normal. No respiratory distress.     Breath sounds: Normal breath sounds. No stridor. No wheezing, rhonchi or rales.  Chest:     Chest wall: No tenderness.  Musculoskeletal:        General: Normal range of motion.     Cervical back: Normal range of motion and neck supple.  Skin:    General: Skin is warm and dry.     Capillary Refill: Capillary refill takes less than 2 seconds.     Coloration: Skin is not jaundiced or pale.     Findings: No bruising, erythema, lesion or rash.  Neurological:     General: No focal deficit present.     Mental Status: She is alert and oriented to person, place, and time. Mental status is at baseline.  Psychiatric:        Mood and Affect: Mood normal.        Behavior: Behavior normal.        Thought Content: Thought content normal.        Judgment: Judgment normal.     Results for orders placed or performed during  the hospital encounter of 02/10/24  POC urine preg, ED   Collection Time: 02/10/24 11:30 PM  Result Value Ref Range   Preg Test, Ur Negative Negative      Assessment & Plan:   Problem List Items Addressed This Visit       Cardiovascular and Mediastinum   Migraine   In exacerbation. Toradol  shot today. We will get her set up with pharmacy to help with what is covered. Refill of sumatriptan  with good Rx card given today. Will see about getting social work involved to help with her mood and insurance. Call with any concerns.       Relevant Medications   ketorolac  (TORADOL ) injection 60 mg   SUMAtriptan  (IMITREX ) 50 MG tablet   Other Relevant Orders   AMB Referral [facility] Care Management     Nervous and Auditory   Pseudotumor cerebri   Will refer to social work and see about getting her back into neurology for lumbar puncture.       Relevant Orders   AMB Referral [facility] Care Management     Other   Bipolar 2 disorder (HCC) - Primary   Not doing well. Has been off medicine. Will start vraylar  with coupon card. Referral to social work and pharmacy placed today.       Relevant Orders   AMB Referral [facility] Care Management     Follow up plan: Return if symptoms worsen or fail to improve.

## 2024-04-19 NOTE — Assessment & Plan Note (Signed)
 Will refer to social work and see about getting her back into neurology for lumbar puncture.

## 2024-04-19 NOTE — Assessment & Plan Note (Signed)
 In exacerbation. Toradol  shot today. We will get her set up with pharmacy to help with what is covered. Refill of sumatriptan  with good Rx card given today. Will see about getting social work involved to help with her mood and insurance. Call with any concerns.

## 2024-04-19 NOTE — Assessment & Plan Note (Signed)
 Not doing well. Has been off medicine. Will start vraylar  with coupon card. Referral to social work and pharmacy placed today.

## 2024-04-22 ENCOUNTER — Other Ambulatory Visit: Payer: Self-pay | Admitting: *Deleted
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
## 2024-04-22 NOTE — Patient Instructions (Signed)
 Visit Information  Thank you for taking time to visit with me today. Please don't hesitate to contact me if I can be of assistance to you before our next scheduled appointment.  Our next appointment is by telephone on 05/09/24 at 11am Please call the care guide team at [phone] if you need to cancel or reschedule your appointment.   Following is a copy of your care plan:   Goals Addressed             This Visit's Progress    [facility] Social Work Care Plan       Problems:   Bi-polar 2  , Corporate treasurer , and Food Insecurity   CSW Clinical Goal(s):   Over the next 90 days the Patient will explore community resource options for unmet needs related to Depression  , Financial Strain , and Food Insecurity .  Interventions:  Mental Health:  Evaluation of current treatment plan related to dx of Bipolar Disorder 2 Patient confirms financial stress related to being unemployed and uninsured Active listening / Reflection utilized Mining engineer reviewed Emotional Support Provided Motivational Interviewing employed Participation in counseling encouraged : provided information to [facility] to complete initial intake PHQ2/PHQ9 completed GAD 7 completed Solution-Focued Strategies employed: Patient agreeable to applying for [employer] as well as Medicaid-states that she has the applications and will complete and submit- patient to continue to practice self care  Patient has appointment with pharmacist on 04/29/24 Suicidal Ideation/Homicidal Ideation assessed: Patient denies thoughts of harm to self or others Patient Goals/Self-Care Activities:  Complete Medicaid and Food stamp application             Follow up with [facility] for ongoing mental health support             Appointment with Pharmacist scheduled for 04/29/24  Plan:   Telephone follow up appointment with care management team member scheduled for:  05/09/24        Please call the Suicide and Crisis Lifeline: 988 call the [place]   National Suicide Prevention Lifeline: (601)852-4866 or TTY: 614-180-2304 TTY 785-089-8455) to talk to a trained counselor call 1-800-273-TALK (toll free, 24 hour hotline) if you are experiencing a Mental Health or Behavioral Health Crisis or need someone to talk to.  Patient verbalizes understanding of instructions and care plan provided today and agrees to view in MyChart. Active MyChart status and patient understanding of how to access instructions and care plan via MyChart confirmed with patient.     Alexis Chilcott, LCSW [place]  [facility] Licensed Clinical Social Worker  Direct Dial: [phone]

## 2024-04-29 ENCOUNTER — Other Ambulatory Visit: Payer: Self-pay

## 2024-04-29 NOTE — Progress Notes (Signed)
 04/29/2024 Name: Alexis Rangel MRN: [id number] DOB: 1985/11/11  Chief Complaint  Patient presents with   Medication Management   Alexis Rangel is a 38 y.o. year old female who presented for a telephone visit.   They were referred to the pharmacist by their PCP for assistance in managing medication access.   Subjective:  Care Team: Primary Care Provider: Vicci Duwaine SQUIBB, DO ; Next Scheduled Visit: 9/23  Medication Access/Adherence  Current Pharmacy:  [facility] [address], [place] - [address] [place] [address] Phone: [phone] Fax: [phone]  [facility] [id number] [place], [place] - [address] [place] [address] Phone: [phone] Fax: [phone]  [facility] - [place], [place] - [address] [place] [address] Phone: [phone] Fax: [phone]  [email or web address] - [place], [place] - [address] [place] [address] Phone: [phone] Fax: [phone]  -Patient reports affordability concerns with their medications: Yes  -Patient reports access/transportation concerns to their pharmacy: No  -Patient reports adherence concerns with their medications:  Yes    Medication Management: -Patient was recently approved for Medicaid but had been without insurance and was unable to fill some medications based on cost -Recently prescribed Vraylar  1.5mg  daily for bipolar, and she has picked this medication up but has not started taking.  Patient endorses concern with potential weight gain associated with medication and states she has gained weight and does not wish to gain additional. -Patient was taking Wegovy  and b12 injections in the past, which helped with weight gain; but Wegovy  was stopped due to availability issues -Patient is prescribed Emgality  120mg  monthly for migraines and states this  medication has been very beneficial, but she has been without the mediation since the end of June due to cost  Objective:  Lab Results  Component Value Date   HGBA1C 5.9 (H) 10/16/2022   Lab Results  Component Value Date   CREATININE 0.70 08/07/2023   BUN 11 08/07/2023   NA 137 08/07/2023   K 3.6 08/07/2023   CL 104 08/07/2023   CO2 24 08/07/2023   Lab Results  Component Value Date   CHOL 182 11/11/2022   HDL 45 11/11/2022   LDLCALC 118 (H) 11/11/2022   TRIG 104 11/11/2022   CHOLHDL 5.4 (H) 07/16/2021   Medications Reviewed Today     Reviewed by Deanna Channing LABOR, RPH (Pharmacist) on 04/29/24 at 1519  Med List Status: <None>   Medication Order Taking? Sig Documenting Provider Last Dose Status Informant  acetaminophen  (TYLENOL ) 650 MG suppository [id number] Yes Place 1 suppository (650 mg total) rectally every 4 (four) hours as needed. Janit Kast, PA-C  Active   cariprazine  (VRAYLAR ) 1.5 MG capsule [id number]  Take 1 capsule (1.5 mg total) by mouth daily.  Patient not taking: Reported on 04/29/2024   Vicci Duwaine SQUIBB, DO  Active   cyclobenzaprine  (FLEXERIL ) 5 MG tablet [id number] Yes Take 1 tablet (5 mg total) by mouth 3 (three) times daily as needed. Brimage, Vondra, DO  Active   fluticasone  (FLONASE ) 50 MCG/ACT nasal spray [id number] Yes Place 2 sprays into both nostrils daily. Melvin Pao, NP  Active   Galcanezumab -gnlm (EMGALITY ) 120 MG/ML SOAJ [id number]  Inject 120 mg into the skin every 30 (thirty) days.  Patient not taking: Reported on 04/29/2024   Johnson, Megan  P, DO  Active   ibuprofen  (ADVIL ) 800 MG tablet [id number] Yes TAKE 1 TABLET BY MOUTH EVERY 8 HOURS AS NEEDED Johnson, Megan P, DO  Active   Levonorgestrel  (KYLEENA ) 19.5 MG IUD [id number]  1 Device by Intrauterine route once. Lenon Delaine SAILOR, CNM  Active Pharmacy Records  naproxen  (NAPROSYN ) 500 MG tablet [id number] Yes Take 1 tablet (500 mg total) by mouth 2 (two) times daily with a meal. Melvin Pao, NP  Active   SUMAtriptan  (IMITREX ) 50 MG tablet [id number] Yes Take 1 tablet (50 mg total) by mouth every 2 (two) hours as needed for migraine. May repeat in 2 hours if headache persists or recurs. Vicci Bouchard P, DO  Active            Assessment/Plan:   Medication Management: -Advised patient that Vraylar  (like many mediations in the same drug class) does have the risk of causing weight gain, but this is patient specific and does not occur with everyone.  Suggested taking mediation for 1 month to weigh pros and cons.  Patient is in agreement. -Prior authorization submitted and approved for Emgality ; I recommend resuming 120mg  monthly dose -I recommend resuming Wegovy  at 0.25mg  dose and titrating based on results; PA has been approved by patient's new Medicaid plan  Patient would also like to resume monthly B12 injections (last B12 labs were drawn in 2024 and value was low at 151).  Orders pending for PCP to sign if in agreement.  Follow Up Plan: Will notify patient in regard to PA's and initiation of Wegovy  and schedule a 4 week telephone follow-up  Channing DELENA Mealing, PharmD, DPLA

## 2024-05-03 ENCOUNTER — Telehealth: Payer: Self-pay

## 2024-05-03 MED ORDER — WEGOVY 0.25 MG/0.5ML ~~LOC~~ SOAJ: 0.2500 mg | SUBCUTANEOUS | 0 refills | Status: DC

## 2024-05-03 MED ORDER — SYRINGE (DISPOSABLE) 1 ML MISC: 0 refills | Status: DC

## 2024-05-03 MED ORDER — CYANOCOBALAMIN 1000 MCG/ML IJ SOLN: 1000.0000 ug | INTRAMUSCULAR | 2 refills | Status: DC

## 2024-05-03 NOTE — Progress Notes (Signed)
   05/03/2024  Patient ID: Alexis Rangel, female   DOB: June 26, 1986, 38 y.o.   MRN: [id number]  Orders for Wegovy , B12, and syringe/needle sent to [facility].  Contacted the pharmacy to verify insurance coverage, but they did not have patient's Medicaid on file.  Provided this information, and these medications along with Emgality  are going through for $4 copays each and will be ready for pick up later today.  Attempted to contact patient to notify and schedule a telephone follow-up visit in 4 weeks, but I was not able to reach her.  Sending a MyChart message, and I also left a voicemail with my direct phone number.  Alexis Rangel, PharmD, DPLA

## 2024-05-09 ENCOUNTER — Other Ambulatory Visit: Payer: Self-pay | Admitting: *Deleted

## 2024-05-09 NOTE — Patient Outreach (Signed)
 Complex Care Management   Visit Note  05/09/2024  Name:  Alexis Rangel MRN: [id number] DOB: 02/18/1986  Situation: Referral received for Complex Care Management related to Mental/Behavioral Health diagnosis Bipolar Disorder. Patient now receiving food stamps and Medicaid. I obtained verbal consent from Patient.  Visit completed with Patient  on the phone   Background:   Past Medical History:  Diagnosis Date   Bipolar 1 disorder (HCC)    Depression    Headache    PCO (polycystic ovaries)     Assessment: Patient Reported Symptoms:  Cognitive Cognitive Status: Alert and oriented to person, place, and time, Insightful and able to interpret abstract concepts, Normal speech and language skills Cognitive/Intellectual Conditions Management [RPT]: None reported or documented in medical history or problem list   Health Maintenance Behaviors: Annual physical exam Healing Pattern: Average Health Facilitated by: Stress management, Rest  Neurological Neurological Review of Symptoms: Headaches (Neurologists scheduled for December 24, 8am) Neurological Management Strategies: Medication therapy, Coping strategies Neurological Self-Management Outcome: 4 (good) Neurological Comment: continues to manage headaches with medication, dark room. cod/warm packs-appt scheduled with Neurologist 12/24 at 8am  HEENT HEENT Symptoms Reported: Tinnitus (Tinnitus has improved , states that she has been ablet to zones it out some) HEENT Management Strategies: Coping strategies, Adequate rest    Cardiovascular Cardiovascular Symptoms Reported: No symptoms reported    Respiratory Respiratory Symptoms Reported: Productive cough Respiratory Management Strategies: Adequate rest, Medication therapy Respiratory Self-Management Outcome: 3 (uncertain)  Endocrine Endocrine Symptoms Reported: No symptoms reported    Gastrointestinal Gastrointestinal Symptoms Reported: No symptoms reported      Genitourinary  Genitourinary Symptoms Reported: No symptoms reported    Integumentary Integumentary Symptoms Reported: No symptoms reported    Musculoskeletal Musculoskelatal Symptoms Reviewed: No symptoms reported        Psychosocial Psychosocial Symptoms Reported: Depression - if selected complete PHQ 2-9 Additional Psychological Details: Patient attended court last Tuesday, statesthat ex  would drop all charges if she pays over 2000.00 for appliances purchased-cannot pay that, next court date 07/25/24-agreeable to ongoing mental health support-referral to [employer] completed-currently receiving unemployment, medicaid and food stamps Behavioral Management Strategies: Coping strategies, Medication therapy Behaviors When Feeling Stressed/Fearful: continues to spend time with family, establishing strong boundaries with ex-boyfriend, practiciing self care Techniques to [employer] with Loss/Stress/Change: Diversional activities Quality of Family Relationships: supportive    05/09/2024    PHQ2-9 Depression Screening   Little interest or pleasure in doing things    Feeling down, depressed, or hopeless    PHQ-2 - Total Score    Trouble falling or staying asleep, or sleeping too much    Feeling tired or having little energy    Poor appetite or overeating     Feeling bad about yourself - or that you are a failure or have let yourself or your family down    Trouble concentrating on things, such as reading the newspaper or watching television    Moving or speaking so slowly that other people could have noticed.  Or the opposite - being so fidgety or restless that you have been moving around a lot more than usual    Thoughts that you would be better off dead, or hurting yourself in some way    PHQ2-9 Total Score    If you checked off any problems, how difficult have these problems made it for you to do your work, take care of things at home, or get along with other people    Depression Interventions/Treatment  There were no vitals filed for this visit.  Medications Reviewed Today     Reviewed by Ermalinda Lenn HERO, LCSW (Social Worker) on 05/09/24 at 1211  Med List Status: <None>   Medication Order Taking? Sig Documenting Provider Last Dose Status Informant  acetaminophen  (TYLENOL ) 650 MG suppository [id number]  Place 1 suppository (650 mg total) rectally every 4 (four) hours as needed. Janit Kast, PA-C  Active   cariprazine  (VRAYLAR ) 1.5 MG capsule [id number]  Take 1 capsule (1.5 mg total) by mouth daily. Johnson, Megan P, DO  Active   cyanocobalamin  (VITAMIN B12) 1000 MCG/ML injection [id number]  Inject 1 mL (1,000 mcg total) into the muscle every 30 (thirty) days. Johnson, Megan P, DO  Active   cyclobenzaprine  (FLEXERIL ) 5 MG tablet [id number]  Take 1 tablet (5 mg total) by mouth 3 (three) times daily as needed. Brimage, Vondra, DO  Active   fluticasone  (FLONASE ) 50 MCG/ACT nasal spray [id number]  Place 2 sprays into both nostrils daily. Melvin Pao, NP  Active   Galcanezumab -gnlm (EMGALITY ) 120 MG/ML SOAJ [id number]  Inject 120 mg into the skin every 30 (thirty) days. Johnson, Megan P, DO  Active   ibuprofen  (ADVIL ) 800 MG tablet [id number]  TAKE 1 TABLET BY MOUTH EVERY 8 HOURS AS NEEDED Johnson, Megan P, DO  Active   Levonorgestrel  (KYLEENA ) 19.5 MG IUD [id number]  1 Device by Intrauterine route once. Lenon Delaine SAILOR, CNM  Active Pharmacy Records  naproxen  (NAPROSYN ) 500 MG tablet [id number]  Take 1 tablet (500 mg total) by mouth 2 (two) times daily with a meal. Melvin Pao, NP  Active   semaglutide -weight management (WEGOVY ) 0.25 MG/0.5ML SOAJ SQ injection [id number]  Inject 0.25 mg into the skin once a week. Vicci, Megan P, DO  Active   SUMAtriptan  (IMITREX ) 50 MG tablet [id number]  Take 1 tablet (50 mg total) by mouth every 2 (two) hours as needed for migraine. May repeat in 2 hours if headache persists or recurs. Vicci Duwaine SQUIBB, DO  Active   Syringe, Disposable, 1 ML MISC  [id number]  Use to inject B12 monthly- please dispense appropriate needle/syringe to inject B12 Johnson, Megan P, DO  Active             Recommendation:   PCP Follow-up Specialty provider follow-up as scheduled-Neurology 12/24 8am Ongoing mental health follow up  Follow Up Plan:   Telephone follow up appointment date/time:  05/24/24  Lenn Ermalinda, LCSW [place]  [facility], [facility] Licensed Clinical Social Worker  Direct Dial: [phone]

## 2024-05-09 NOTE — Patient Instructions (Signed)
 Visit Information  Thank you for taking time to visit with me today. Please don't hesitate to contact me if I can be of assistance to you before our next scheduled appointment.  Your next care management appointment is by telephone on 05/24/24 at 2pm   Please call the care guide team at [phone] if you need to cancel, schedule, or reschedule an appointment.   Please call the Suicide and Crisis Lifeline: 988 call the [place]  National Suicide Prevention Lifeline: 805-357-9603 or TTY: 361-856-1175 TTY 929-509-5011) to talk to a trained counselor call 1-800-273-TALK (toll free, 24 hour hotline) if you are experiencing a Mental Health or Behavioral Health Crisis or need someone to talk to.  Hassie Mandt, LCSW [place]  [facility], [facility] Licensed Clinical Social Worker  Direct Dial: [phone]

## 2024-05-12 ENCOUNTER — Ambulatory Visit (INDEPENDENT_AMBULATORY_CARE_PROVIDER_SITE_OTHER): Admitting: Family Medicine

## 2024-05-12 ENCOUNTER — Encounter: Payer: Self-pay | Admitting: Family Medicine

## 2024-05-12 VITALS — BP 125/85 | HR 80 | Temp 97.5°F | Ht 62.0 in | Wt 240.2 lb

## 2024-05-12 DIAGNOSIS — G932 Benign intracranial hypertension: Secondary | ICD-10-CM

## 2024-05-12 DIAGNOSIS — K1379 Other lesions of oral mucosa: Secondary | ICD-10-CM | POA: Diagnosis not present

## 2024-05-12 MED ORDER — PREDNISONE 50 MG PO TABS: 50.0000 mg | ORAL_TABLET | Freq: Every day | ORAL | 0 refills | Status: DC

## 2024-05-12 MED ORDER — TRIAMCINOLONE ACETONIDE 40 MG/ML IJ SUSP
40.0000 mg | Freq: Once | INTRAMUSCULAR | Status: AC
  Administered 2024-05-12: 40 mg via INTRAMUSCULAR

## 2024-05-12 NOTE — Assessment & Plan Note (Signed)
 Can't get into [facility] until December. Will try to get her into [place] sooner. Call with any concerns.

## 2024-05-12 NOTE — Progress Notes (Signed)
 BP 125/85 (BP Location: Left Arm, Patient Position: Sitting, Cuff Size: Normal)   Pulse 80   Temp (!) 97.5 F (36.4 C) (Oral)   Ht 5' 2 (1.575 m)   Wt 240 lb 3.2 oz (109 kg)   SpO2 98%   BMI 43.93 kg/m    Subjective:    Patient ID: Alexis Rangel, female    DOB: April 11, 1986, 38 y.o.   MRN: [id number]  HPI: Alexis Rangel is a 38 y.o. female  Chief Complaint  Patient presents with   Cough    Onset last week   UPPER RESPIRATORY TRACT INFECTION Duration: about a week Worst symptom: sore throat Fever: no Cough: yes Shortness of breath: yes Wheezing: yes Chest pain: no Chest tightness: no Chest congestion: no Nasal congestion: yes Runny nose: yes Post nasal drip: yes Sneezing: no Sore throat: yes Swollen glands: no Sinus pressure: no Headache: no Face pain: no Toothache: no Ear pain: no  Ear pressure: no  Eyes red/itching:no Eye drainage/crusting: no  Vomiting: no Rash: no Fatigue: yes Sick contacts: yes Strep contacts: no  Context: better Recurrent sinusitis: no Relief with OTC cold/cough medications: no  Treatments attempted: cold/sinus   Relevant past medical, surgical, family and social history reviewed and updated as indicated. Interim medical history since our last visit reviewed. Allergies and medications reviewed and updated.  Review of Systems  Constitutional: Negative.   HENT: Negative.    Respiratory:  Positive for cough, shortness of breath and wheezing. Negative for apnea, choking, chest tightness and stridor.   Cardiovascular: Negative.   Musculoskeletal: Negative.   Skin: Negative.   Psychiatric/Behavioral: Negative.      Per HPI unless specifically indicated above     Objective:    BP 125/85 (BP Location: Left Arm, Patient Position: Sitting, Cuff Size: Normal)   Pulse 80   Temp (!) 97.5 F (36.4 C) (Oral)   Ht 5' 2 (1.575 m)   Wt 240 lb 3.2 oz (109 kg)   SpO2 98%   BMI 43.93 kg/m   Wt Readings from Last 3  Encounters:  05/12/24 240 lb 3.2 oz (109 kg)  04/19/24 238 lb (108 kg)  01/14/24 232 lb 6.4 oz (105.4 kg)    Physical Exam Vitals and nursing note reviewed.  Constitutional:      General: She is not in acute distress.    Appearance: Normal appearance. She is not ill-appearing, toxic-appearing or diaphoretic.  HENT:     Head: Normocephalic and atraumatic.     Right Ear: Tympanic membrane, ear canal and external ear normal.     Left Ear: Tympanic membrane, ear canal and external ear normal.     Nose: Rhinorrhea present. No congestion.     Mouth/Throat:     Mouth: Mucous membranes are moist.     Pharynx: Oropharynx is clear. Posterior oropharyngeal erythema present.  Eyes:     General: No scleral icterus.       Right eye: No discharge.        Left eye: No discharge.     Extraocular Movements: Extraocular movements intact.     Conjunctiva/sclera: Conjunctivae normal.     Pupils: Pupils are equal, round, and reactive to light.  Cardiovascular:     Rate and Rhythm: Normal rate and regular rhythm.     Pulses: Normal pulses.     Heart sounds: Normal heart sounds. No murmur heard.    No friction rub. No gallop.  Pulmonary:     Effort: Pulmonary  effort is normal. No respiratory distress.     Breath sounds: Normal breath sounds. No stridor. No wheezing, rhonchi or rales.  Chest:     Chest wall: No tenderness.  Musculoskeletal:        General: Normal range of motion.     Cervical back: Normal range of motion and neck supple.  Skin:    General: Skin is warm and dry.     Capillary Refill: Capillary refill takes less than 2 seconds.     Coloration: Skin is not jaundiced or pale.     Findings: No bruising, erythema, lesion or rash.  Neurological:     General: No focal deficit present.     Mental Status: She is alert and oriented to person, place, and time. Mental status is at baseline.  Psychiatric:        Mood and Affect: Mood normal.        Behavior: Behavior normal.        Thought  Content: Thought content normal.        Judgment: Judgment normal.     Results for orders placed or performed during the hospital encounter of 02/10/24  POC urine preg, ED   Collection Time: 02/10/24 11:30 PM  Result Value Ref Range   Preg Test, Ur Negative Negative      Assessment & Plan:   Problem List Items Addressed This Visit       Nervous and Auditory   Pseudotumor cerebri   Can't get into [facility] until December. Will try to get her into [place] sooner. Call with any concerns.       Relevant Orders   Ambulatory referral to Neurosurgery   Other Visit Diagnoses       Uvular swelling    -  Primary   Likely allergic. Will treat with prednisone . Call with any concerns.   Relevant Medications   triamcinolone  acetonide (KENALOG -40) injection 40 mg (Start on 05/12/2024 10:30 AM)        Follow up plan: Return in about 4 weeks (around 06/09/2024) for physical.

## 2024-05-20 ENCOUNTER — Ambulatory Visit (INDEPENDENT_AMBULATORY_CARE_PROVIDER_SITE_OTHER): Admitting: Neurosurgery

## 2024-05-20 ENCOUNTER — Encounter: Payer: Self-pay | Admitting: Neurosurgery

## 2024-05-20 VITALS — BP 125/85 | HR 92 | Ht 62.0 in | Wt 239.0 lb

## 2024-05-20 DIAGNOSIS — G932 Benign intracranial hypertension: Secondary | ICD-10-CM

## 2024-05-20 NOTE — Progress Notes (Signed)
 Assessment : 38 year old lady with a history of cholecystectomy who since 2018 has been diagnosed with idiopathic intracranial hypertension.  She has had multiple lumbar punctures as a solution for this and the last lumbar puncture was at [facility] with opening pressure of 27.  After that she has relief for a while but after that it recurs.  She has been tried to be treated with Diamox  and Topamax  and both times she has had numbness and tingling and has been intolerant for this.  She was referred to us  because of persistent headaches which happen in her temples and top of her head in the forehead and she has constant ringing in her ears.  She says that her last eye exam did not show any visual loss or swelling in the back of her eye.  She was accompanied by her niece.  She lives with her parents and currently is not employed.  Plan : I am really glad to hear that she has been started on weight loss medication as she started on Wegovy  about a week ago.  Hopefully this will result in significant weight loss and I explained to her that the relationship between elevated BMI and idiopathic intracranial hypertension.  In layman's terms I explained to her the concept of what this is and its relationship to the weight and how medical management is the first step but given the fact that she is intolerant to Diamox  and Topamax , and still has very high pressures per the clinical picture, we have to talk about interventions.  I explained to her the surgical treatments with stents and shunts and she recorded this conversation on her phone.  I explained to her that with the endovascular option of a stent entails and talked briefly about the shunt as well.  She does not want to have the shunt done because she does not want her hair shaved.  I told her that we should first start by getting an MRV and an MRI [have requested T1 BrainLab sequences as well], and go from there.  She is comfortable with that and I will get  these images and I will see her back thereafter.  I impressed upon her that she needs to be diligent with weight loss by pursuing more exercise and healthier diet and she pledged to do so.   Social History   Socioeconomic History   Marital status: Single    Spouse name: Not on file   Number of children: 0   Years of education: Not on file   Highest education level: Not on file  Occupational History    Employer: [employer]  Tobacco Use   Smoking status: Never    Passive exposure: Current   Smokeless tobacco: Never  Vaping Use   Vaping status: Never Used  Substance and Sexual Activity   Alcohol use: Not Currently    Comment: occasionally   Drug use: Never   Sexual activity: Yes    Birth control/protection: I.U.D.    Comment: kyleena - inserted 2022  Other Topics Concern   Not on file  Social History Narrative   Not on file   Social Drivers of Health   Financial Resource Strain: Not on file  Food Insecurity: Food Insecurity Present (04/22/2024)   Hunger Vital Sign    Worried About Running Out of Food in the Last Year: Sometimes true    Ran Out of Food in the Last Year: Never true  Transportation Needs: No Transportation Needs (04/22/2024)   PRAPARE - Transportation  Lack of Transportation (Medical): No    Lack of Transportation (Non-Medical): No  Physical Activity: Not on file  Stress: Not on file  Social Connections: Not on file  Intimate Partner Violence: Not At Risk (04/22/2024)   Humiliation, Afraid, Rape, and Kick questionnaire    Fear of Current or Ex-Partner: No    Emotionally Abused: No    Physically Abused: No    Sexually Abused: No    Family History  Problem Relation Age of Onset   Cancer Mother        cervical   COPD Mother    Hyperlipidemia Father    Hypertension Father    Migraines Father    Heart attack Father    Bipolar disorder Sister    Heart attack Maternal Uncle    Heart attack Paternal Uncle    Stroke Paternal Uncle    Heart disease  Maternal Grandmother    COPD Maternal Grandmother    Heart disease Maternal Grandfather    Hypertension Paternal Grandmother    Heart disease Paternal Grandmother    Hypertension Paternal Grandfather    Heart disease Paternal Grandfather    Alzheimer's disease Paternal Grandfather    Diabetes Neg Hx     Allergies  Allergen Reactions   Topamax  [Topiramate ] Other (See Comments)    Paresthesias     Past Medical History:  Diagnosis Date   Bipolar 1 disorder (HCC)    Depression    Headache    PCO (polycystic ovaries)     Past Surgical History:  Procedure Laterality Date   ccy  06/09/2014   pt states she does not remember this surgery   CHOLECYSTECTOMY     TONSILLECTOMY AND ADENOIDECTOMY     WISDOM TOOTH EXTRACTION       Physical Exam HENT:     Head: Normocephalic.     Nose: Nose normal.  Eyes:     Pupils: Pupils are equal, round, and reactive to light.  Cardiovascular:     Rate and Rhythm: Normal rate.  Pulmonary:     Effort: Pulmonary effort is normal.  Abdominal:     General: Abdomen is flat.  Musculoskeletal:     Cervical back: Normal range of motion.  Neurological:     Mental Status: She is alert.     Cranial Nerves: Cranial nerves 2-12 are intact.     Sensory: Sensation is intact.     Motor: Motor function is intact.     Coordination: Coordination is intact.        Results for orders placed or performed during the hospital encounter of 05/01/17  MR BRAIN W CONTRAST   Narrative   CLINICAL DATA:  Complicated headache. Subependymal nodule on brain MRI 4 days ago.  EXAM: MRI HEAD WITH CONTRAST  TECHNIQUE: Multiplanar, multiecho pulse sequences of the brain and surrounding structures were obtained with intravenous contrast.  CONTRAST:  20mL MULTIHANCE  GADOBENATE DIMEGLUMINE  529 MG/ML IV SOLN  COMPARISON:  Noncontrast brain MRI 4 days ago  FINDINGS: Brain: 7 mm subependymal nodule along the left lateral ventricle is nonenhancing and matches  gray matter intensity on all sequences. No superimposed calcification or second nodule seen. No abnormal intracranial enhancement.  Vascular: Major vessels are opacified, including dural venous sinuses.  Skull and upper cervical spine: Negative for marrow lesion.  Sinuses/Orbits: Negative  IMPRESSION: The 7 mm subependymal nodule along the left lateral ventricle is nonenhancing and consistent with heterotopic gray matter.   Electronically Signed   By: Cassondra Adele HERO.D.  On: 05/01/2017 13:50   Results for orders placed or performed during the hospital encounter of 04/27/17  MR Brain Wo Contrast   Narrative   CLINICAL DATA:  Migraine headaches for 2 years, increasing in frequency.  EXAM: MRI HEAD WITHOUT CONTRAST  TECHNIQUE: Multiplanar, multiecho pulse sequences of the brain and surrounding structures were obtained without intravenous contrast.  COMPARISON:  None.  FINDINGS: Brain: There is no evidence of acute infarct, intracranial hemorrhage, midline shift, or extra-axial fluid collection. The ventricles and sulci are normal in size. There is a 7 mm subependymal nodule in the atrium of the left lateral ventricle with T1 and T2 signal similar to that of gray matter. The brain is normal in signal elsewhere. A moderately expanded partially empty sella is noted.  Vascular: Major intracranial vascular flow voids are preserved.  Skull and upper cervical spine: Unremarkable bone marrow signal.  Sinuses/Orbits: Mild flattening of the posterior globes at the optic nerve insertions with minimal prominence of the CSF in the adjacent optic nerve sheaths. Paranasal sinuses and mastoid air cells are clear.  Other: None.  IMPRESSION: 1. Partially empty sella and flattening of the posterior globes which may reflect idiopathic intracranial hypertension. Consider correlation with lumbar puncture and opening pressure. 2. 7 mm left lateral ventricle subependymal nodule.  This may represent a focus of subependymal gray matter heterotopia. Postcontrast brain MRI is recommended to exclude enhancement and small neoplasm.   Electronically Signed   By: Dasie Hamburg M.D.   On: 04/28/2017 08:17

## 2024-05-23 ENCOUNTER — Encounter: Payer: Self-pay | Admitting: Neurosurgery

## 2024-05-24 ENCOUNTER — Ambulatory Visit: Payer: Self-pay | Admitting: Family Medicine

## 2024-05-24 ENCOUNTER — Telehealth: Payer: Self-pay | Admitting: *Deleted

## 2024-05-24 NOTE — Patient Outreach (Signed)
 Phone call to patient for schedule follow up. Patient was not available at the time of call and requested to re-scheduled. Follow up appointment scheduled for 05/30/24 11:30am   Natasha Paulson, LCSW [place]  [facility], [facility] Licensed Clinical Social Worker  Direct Dial: [phone]

## 2024-05-26 ENCOUNTER — Telehealth: Payer: Self-pay | Admitting: Neurosurgery

## 2024-05-26 ENCOUNTER — Other Ambulatory Visit: Payer: Self-pay | Admitting: Family Medicine

## 2024-05-26 NOTE — Telephone Encounter (Signed)
 Patient called to see if she can get her referral [id number]  resent because her insurance did not approve it. Would like to get a response ASAP

## 2024-05-27 ENCOUNTER — Other Ambulatory Visit: Payer: Self-pay | Admitting: Family Medicine

## 2024-05-27 ENCOUNTER — Other Ambulatory Visit

## 2024-05-27 MED ORDER — WEGOVY 0.5 MG/0.5ML ~~LOC~~ SOAJ: 0.5000 mg | SUBCUTANEOUS | 1 refills | Status: DC

## 2024-05-27 NOTE — Telephone Encounter (Signed)
 Requested medication (s) are due for refill today: yes  Requested medication (s) are on the active medication list: yes  Last refill:  05/03/24 2 ml  Future visit scheduled: no  Notes to clinic:  overdue HgbA1C   Requested Prescriptions  Pending Prescriptions Disp Refills   WEGOVY  0.25 MG/0.5ML SOAJ SQ injection [Pharmacy Med Name: Wegovy  0.25 MG/0.5ML Subcutaneous Solution Auto-injector] 4 mL 0    Sig: INJECT 0.25MG  INTO THE SKIN ONCE A WEEK     Endocrinology:  Diabetes - GLP-1 Receptor Agonists - semaglutide  Failed - 05/27/2024 12:54 PM      Failed - HBA1C in normal range and within 180 days    Hgb A1c MFr Bld  Date Value Ref Range Status  10/16/2022 5.9 (H) 4.8 - 5.6 % Final    Comment:             Prediabetes: 5.7 - 6.4          Diabetes: >6.4          Glycemic control for adults with diabetes: <7.0          Failed - Valid encounter within last 6 months    Recent Outpatient Visits           2 weeks ago Uvular swelling   [place] [facility] [place], Megan P, DO   1 month ago Bipolar 2 disorder [facility])   [place] [facility] [place], Megan P, DO   4 months ago Sinus headache   [place] [facility] Melvin Pao, NP   4 months ago Bipolar 2 disorder [facility])   [place] [facility] Vicci Duwaine SQUIBB, DO       Future Appointments             In 2 weeks Rosslyn Dino HERO, MD [facility] at [facility] - Cr in normal range and within 360 days    Creatinine  Date Value Ref Range Status  09/04/2014 0.67 0.60 - 1.30 mg/dL Final   Creatinine, Ser  Date Value Ref Range Status  08/07/2023 0.70 0.44 - 1.00 mg/dL Final

## 2024-05-27 NOTE — Telephone Encounter (Signed)
 Should have enough to make it to her next appointment- otherwise needs to go up to 0.5mg 

## 2024-05-29 NOTE — Progress Notes (Unsigned)
   05/30/2024 Name: ZARIELLE CEA MRN: [id number] DOB: Feb 07, 1986  Outreach attempt for scheduled telephone visit was not successful, but I was able to leave a HIPAA compliant voicemail with my direct phone number.  I am also sending a MyChart message to attempt to reschedule visit, and will call patient in another week or two if I do not hear back.  Channing DELENA Mealing, PharmD, DPLA

## 2024-05-30 ENCOUNTER — Encounter: Payer: Self-pay | Admitting: *Deleted

## 2024-05-30 ENCOUNTER — Other Ambulatory Visit: Payer: Self-pay

## 2024-05-30 ENCOUNTER — Telehealth: Payer: Self-pay | Admitting: *Deleted

## 2024-05-30 NOTE — Patient Instructions (Signed)
 Annabella LITTIE Albee - I am sorry I was unable to reach you today for our scheduled appointment. I work with Vicci Duwaine SQUIBB, DO and am calling to support your healthcare needs. Please contact me at [phone] at your earliest convenience. I look forward to speaking with you soon.   Thank you,    Stanley Lyness, LCSW [place]  [facility], [facility] Licensed Clinical Social Worker  Direct Dial: [phone]

## 2024-06-06 ENCOUNTER — Other Ambulatory Visit

## 2024-06-08 ENCOUNTER — Other Ambulatory Visit

## 2024-06-08 ENCOUNTER — Ambulatory Visit
Admission: RE | Admit: 2024-06-08 | Discharge: 2024-06-08 | Disposition: A | Discharge status: Home / Self Care | Source: Ambulatory Visit | Attending: Neurosurgery | Admitting: Neurosurgery

## 2024-06-08 DIAGNOSIS — G932 Benign intracranial hypertension: Secondary | ICD-10-CM

## 2024-06-10 ENCOUNTER — Ambulatory Visit: Admitting: Neurosurgery

## 2024-06-11 ENCOUNTER — Ambulatory Visit
Admission: RE | Admit: 2024-06-11 | Discharge: 2024-06-11 | Disposition: A | Source: Ambulatory Visit | Attending: Neurosurgery | Admitting: Neurosurgery

## 2024-06-11 DIAGNOSIS — G932 Benign intracranial hypertension: Secondary | ICD-10-CM

## 2024-06-14 ENCOUNTER — Other Ambulatory Visit: Payer: Self-pay

## 2024-06-14 ENCOUNTER — Encounter: Payer: Self-pay | Admitting: Neurosurgery

## 2024-06-14 ENCOUNTER — Other Ambulatory Visit (HOSPITAL_COMMUNITY)
Admission: RE | Admit: 2024-06-14 | Discharge: 2024-06-14 | Disposition: A | Source: Ambulatory Visit | Attending: Family Medicine | Admitting: Family Medicine

## 2024-06-14 ENCOUNTER — Encounter: Payer: Self-pay | Admitting: Family Medicine

## 2024-06-14 ENCOUNTER — Ambulatory Visit (INDEPENDENT_AMBULATORY_CARE_PROVIDER_SITE_OTHER): Admitting: Neurosurgery

## 2024-06-14 ENCOUNTER — Ambulatory Visit
Admission: RE | Admit: 2024-06-14 | Discharge: 2024-06-14 | Disposition: A | Discharge status: Home / Self Care | Payer: Self-pay | Source: Ambulatory Visit | Attending: Neurosurgery | Admitting: Neurosurgery

## 2024-06-14 ENCOUNTER — Ambulatory Visit (INDEPENDENT_AMBULATORY_CARE_PROVIDER_SITE_OTHER): Admitting: Family Medicine

## 2024-06-14 ENCOUNTER — Other Ambulatory Visit: Payer: Self-pay | Admitting: Family Medicine

## 2024-06-14 VITALS — BP 120/78 | HR 87 | Temp 97.8°F | Ht 62.0 in | Wt 240.8 lb

## 2024-06-14 VITALS — BP 118/78 | HR 92 | Temp 97.7°F | Ht 62.0 in | Wt 239.0 lb

## 2024-06-14 DIAGNOSIS — G932 Benign intracranial hypertension: Secondary | ICD-10-CM

## 2024-06-14 DIAGNOSIS — E538 Deficiency of other specified B group vitamins: Secondary | ICD-10-CM

## 2024-06-14 DIAGNOSIS — Z0000 Encounter for general adult medical examination without abnormal findings: Secondary | ICD-10-CM | POA: Diagnosis not present

## 2024-06-14 DIAGNOSIS — J3089 Other allergic rhinitis: Secondary | ICD-10-CM | POA: Diagnosis not present

## 2024-06-14 DIAGNOSIS — R7301 Impaired fasting glucose: Secondary | ICD-10-CM | POA: Insufficient documentation

## 2024-06-14 LAB — BAYER DCA HB A1C WAIVED: HB A1C (BAYER DCA - WAIVED): 5.6 % (ref 4.8–5.6)

## 2024-06-14 MED ORDER — IBUPROFEN 800 MG PO TABS: 800.0000 mg | ORAL_TABLET | Freq: Three times a day (TID) | ORAL | 3 refills | Status: AC | PRN

## 2024-06-14 MED ORDER — ZONISAMIDE 25 MG PO CAPS: 25.0000 mg | ORAL_CAPSULE | Freq: Every day | ORAL | 3 refills | Status: AC

## 2024-06-14 NOTE — Progress Notes (Addendum)
 BP 118/78   Pulse 92   Temp 97.7 F (36.5 C) (Oral)   Ht 5' 2 (1.575 m)   Wt 239 lb (108.4 kg)   SpO2 98%   BMI 43.71 kg/m    Subjective:    Patient ID: Alexis Rangel, female    DOB: 01-23-1986, 38 y.o.   MRN: [id number]  HPI: CHANDEL ZAUN is a 38 y.o. female presenting on 06/14/2024 for comprehensive medical examination. Current medical complaints include:none  Menopausal Symptoms: no  Depression Screen done today and results listed below:     04/22/2024   10:56 AM 01/14/2024    3:31 PM 12/30/2023   10:34 AM 09/16/2023    9:43 AM 09/11/2023    1:02 PM  Depression screen PHQ 2/9  Decreased Interest 2 1 1 1  0  Down, Depressed, Hopeless 3 1 1  0 1  PHQ - 2 Score 5 2 2 1 1   Altered sleeping 3 0 2 0 0  Tired, decreased energy 3 1 1  0 1  Change in appetite 1 2 0 1 2  Feeling bad or failure about yourself  3 0 0 1 0  Trouble concentrating 1 1 1  0 0  Moving slowly or fidgety/restless 0 0 0 0 0  Suicidal thoughts 0 0 0 0 0  PHQ-9 Score 16 6 6 3 4   Difficult doing work/chores  Not difficult at all Not difficult at all Not difficult at all Not difficult at all    Past Medical History:  Past Medical History:  Diagnosis Date   Bipolar 1 disorder (HCC)    Depression    Headache    PCO (polycystic ovaries)     Surgical History:  Past Surgical History:  Procedure Laterality Date   ccy  06/09/2014   pt states she does not remember this surgery   CHOLECYSTECTOMY     TONSILLECTOMY AND ADENOIDECTOMY     WISDOM TOOTH EXTRACTION      Medications:  Current Outpatient Medications on File Prior to Visit  Medication Sig   acetaminophen  (TYLENOL ) 650 MG suppository Place 1 suppository (650 mg total) rectally every 4 (four) hours as needed.   B-D 3CC LUER-LOK SYR 25GX1 25G X 1 3 ML MISC USE TO INJECT B12 MONTHLY   cariprazine  (VRAYLAR ) 1.5 MG capsule Take 1 capsule (1.5 mg total) by mouth daily.   cyanocobalamin  (VITAMIN B12) 1000 MCG/ML injection Inject 1 mL (1,000  mcg total) into the muscle every 30 (thirty) days.   cyclobenzaprine  (FLEXERIL ) 5 MG tablet Take 1 tablet (5 mg total) by mouth 3 (three) times daily as needed.   fluticasone  (FLONASE ) 50 MCG/ACT nasal spray Place 2 sprays into both nostrils daily.   Galcanezumab -gnlm (EMGALITY ) 120 MG/ML SOAJ Inject 120 mg into the skin every 30 (thirty) days.   Levonorgestrel  (KYLEENA ) 19.5 MG IUD 1 Device by Intrauterine route once.   naproxen  (NAPROSYN ) 500 MG tablet Take 1 tablet (500 mg total) by mouth 2 (two) times daily with a meal.   SUMAtriptan  (IMITREX ) 50 MG tablet Take 1 tablet (50 mg total) by mouth every 2 (two) hours as needed for migraine. May repeat in 2 hours if headache persists or recurs.   Syringe, Disposable, 1 ML MISC Use to inject B12 monthly- please dispense appropriate needle/syringe to inject B12   No current facility-administered medications on file prior to visit.    Allergies:  Allergies  Allergen Reactions   Topamax  [Topiramate ] Other (See Comments)    Paresthesias  Social History:  Social History   Socioeconomic History   Marital status: Single    Spouse name: Not on file   Number of children: 0   Years of education: Not on file   Highest education level: Not on file  Occupational History    Employer: [employer]  Tobacco Use   Smoking status: Never    Passive exposure: Current   Smokeless tobacco: Never  Vaping Use   Vaping status: Never Used  Substance and Sexual Activity   Alcohol use: Not Currently    Comment: occasionally   Drug use: Never   Sexual activity: Yes    Birth control/protection: I.U.D.    Comment: kyleena - inserted 2022  Other Topics Concern   Not on file  Social History Narrative   Not on file   Social Drivers of Health   Financial Resource Strain: Low Risk  (06/14/2024)   Overall Financial Resource Strain (CARDIA)    Difficulty of Paying Living Expenses: Not very hard  Food Insecurity: Food Insecurity Present (04/22/2024)    Hunger Vital Sign    Worried About Running Out of Food in the Last Year: Sometimes true    Ran Out of Food in the Last Year: Never true  Transportation Needs: No Transportation Needs (04/22/2024)   PRAPARE - Administrator, Civil Service (Medical): No    Lack of Transportation (Non-Medical): No  Physical Activity: Sufficiently Active (06/14/2024)   Exercise Vital Sign    Days of Exercise per Week: 7 days    Minutes of Exercise per Session: 30 min  Stress: Not on file  Social Connections: Socially Isolated (06/14/2024)   Social Connection and Isolation Panel    Frequency of Communication with Friends and Family: More than three times a week    Frequency of Social Gatherings with Friends and Family: More than three times a week    Attends Religious Services: Never    Database administrator or Organizations: No    Attends Banker Meetings: Never    Marital Status: Never married  Intimate Partner Violence: Not At Risk (04/22/2024)   Humiliation, Afraid, Rape, and Kick questionnaire    Fear of Current or Ex-Partner: No    Emotionally Abused: No    Physically Abused: No    Sexually Abused: No   Social History   Tobacco Use  Smoking Status Never   Passive exposure: Current  Smokeless Tobacco Never   Social History   Substance and Sexual Activity  Alcohol Use Not Currently   Comment: occasionally    Family History:  Family History  Problem Relation Age of Onset   Cancer Mother        cervical   COPD Mother    Hyperlipidemia Father    Hypertension Father    Migraines Father    Heart attack Father    Bipolar disorder Sister    Heart attack Maternal Uncle    Heart attack Paternal Uncle    Stroke Paternal Uncle    Heart disease Maternal Grandmother    COPD Maternal Grandmother    Heart disease Maternal Grandfather    Hypertension Paternal Grandmother    Heart disease Paternal Grandmother    Hypertension Paternal Grandfather    Heart disease  Paternal Grandfather    Alzheimer's disease Paternal Grandfather    Diabetes Neg Hx     Past medical history, surgical history, medications, allergies, family history and social history reviewed with patient today and changes made to appropriate  areas of the chart.   Review of Systems  Constitutional: Negative.   HENT:  Positive for tinnitus. Negative for congestion, ear discharge, ear pain, hearing loss, nosebleeds, sinus pain and sore throat.        + itchy throat  Eyes: Negative.   Respiratory:  Positive for cough. Negative for hemoptysis, sputum production, shortness of breath, wheezing and stridor.   Cardiovascular: Negative.   Skin: Negative.    All other ROS negative except what is listed above and in the HPI.      Objective:    BP 118/78   Pulse 92   Temp 97.7 F (36.5 C) (Oral)   Ht 5' 2 (1.575 m)   Wt 239 lb (108.4 kg)   SpO2 98%   BMI 43.71 kg/m   Wt Readings from Last 3 Encounters:  06/14/24 239 lb (108.4 kg)  06/14/24 240 lb 12.8 oz (109.2 kg)  05/20/24 239 lb (108.4 kg)    Physical Exam Vitals and nursing note reviewed. Exam conducted with a chaperone present.  Constitutional:      General: She is not in acute distress.    Appearance: Normal appearance. She is obese. She is not ill-appearing, toxic-appearing or diaphoretic.  HENT:     Head: Normocephalic and atraumatic.     Right Ear: Tympanic membrane, ear canal and external ear normal. There is no impacted cerumen.     Left Ear: Tympanic membrane, ear canal and external ear normal. There is no impacted cerumen.     Nose: Nose normal. No congestion or rhinorrhea.     Mouth/Throat:     Mouth: Mucous membranes are moist.     Pharynx: Oropharynx is clear. No oropharyngeal exudate or posterior oropharyngeal erythema.  Eyes:     General: No scleral icterus.       Right eye: No discharge.        Left eye: No discharge.     Extraocular Movements: Extraocular movements intact.     Conjunctiva/sclera:  Conjunctivae normal.     Pupils: Pupils are equal, round, and reactive to light.  Neck:     Vascular: No carotid bruit.  Cardiovascular:     Rate and Rhythm: Normal rate and regular rhythm.     Pulses: Normal pulses.     Heart sounds: No murmur heard.    No friction rub. No gallop.  Pulmonary:     Effort: Pulmonary effort is normal. No respiratory distress.     Breath sounds: Normal breath sounds. No stridor. No wheezing, rhonchi or rales.  Chest:     Chest wall: No tenderness.  Breasts:    Right: Normal.     Left: Normal.  Abdominal:     General: Abdomen is flat. Bowel sounds are normal. There is no distension.     Palpations: Abdomen is soft. There is no mass.     Tenderness: There is no abdominal tenderness. There is no right CVA tenderness, left CVA tenderness, guarding or rebound.     Hernia: No hernia is present. There is no hernia in the left inguinal area or right inguinal area.  Genitourinary:    Labia:        Right: No rash, tenderness, lesion or injury.        Left: No rash, tenderness, lesion or injury.      Vagina: Normal.     Cervix: Normal.     Uterus: Normal.      Adnexa: Right adnexa normal and left adnexa normal.  Musculoskeletal:        General: No swelling, tenderness, deformity or signs of injury.     Cervical back: Normal range of motion and neck supple. No rigidity. No muscular tenderness.     Right lower leg: No edema.     Left lower leg: No edema.  Lymphadenopathy:     Cervical: No cervical adenopathy.  Skin:    General: Skin is warm and dry.     Capillary Refill: Capillary refill takes less than 2 seconds.     Coloration: Skin is not jaundiced or pale.     Findings: No bruising, erythema, lesion or rash.  Neurological:     General: No focal deficit present.     Mental Status: She is alert and oriented to person, place, and time. Mental status is at baseline.     Cranial Nerves: No cranial nerve deficit.     Sensory: No sensory deficit.      Motor: No weakness.     Coordination: Coordination normal.     Gait: Gait normal.     Deep Tendon Reflexes: Reflexes normal.  Psychiatric:        Mood and Affect: Mood normal.        Behavior: Behavior normal.        Thought Content: Thought content normal.        Judgment: Judgment normal.     Results for orders placed or performed in visit on 06/14/24  Bayer DCA Hb A1c Waived   Collection Time: 06/14/24  4:08 PM  Result Value Ref Range   HB A1C (BAYER DCA - WAIVED) 5.6 4.8 - 5.6 %      Assessment & Plan:   Problem List Items Addressed This Visit       Endocrine   IFG (impaired fasting glucose)   Rechecking labs today. Await results. Treat as needed.       Relevant Orders   Bayer DCA Hb A1c Waived (Completed)     Other   B12 deficiency   Rechecking labs today. Await results. Treat as needed.       Relevant Orders   B12   Other Visit Diagnoses       Routine general medical examination at a health care facility    -  Primary   Vaccines updated/declined. Screening labs checked today. Pap done. Continue diet and exercise. Call with any concerns.   Relevant Orders   CBC with Differential/Platelet   Comprehensive metabolic panel with GFR   Lipid Panel w/o Chol/HDL Ratio   Cytology - PAP   TSH     Non-seasonal allergic rhinitis due to fungal spores       Referral to allergy placed today.   Relevant Orders   Ambulatory referral to Allergy        Follow up plan: Return in about 4 weeks (around 07/12/2024).   LABORATORY TESTING:  - Pap smear: pap done  IMMUNIZATIONS:   - Tdap: Tetanus vaccination status reviewed: last tetanus booster within 10 years. - Influenza: Refused - Pneumovax: Not applicable - Prevnar: Not applicable - COVID: Refused - HPV: Up to date   PATIENT COUNSELING:   Advised to take 1 mg of folate supplement per day if capable of pregnancy.   Sexuality: Discussed sexually transmitted diseases, partner selection, use of condoms,  avoidance of unintended pregnancy  and contraceptive alternatives.   Advised to avoid cigarette smoking.  I discussed with the patient that most people either abstain from alcohol or drink within  safe limits (<=14/week and <=4 drinks/occasion for males, <=7/weeks and <= 3 drinks/occasion for females) and that the risk for alcohol disorders and other health effects rises proportionally with the number of drinks per week and how often a drinker exceeds daily limits.  Discussed cessation/primary prevention of drug use and availability of treatment for abuse.   Diet: Encouraged to adjust caloric intake to maintain  or achieve ideal body weight, to reduce intake of dietary saturated fat and total fat, to limit sodium intake by avoiding high sodium foods and not adding table salt, and to maintain adequate dietary potassium and calcium preferably from fresh fruits, vegetables, and low-fat dairy products.    stressed the importance of regular exercise  Injury prevention: Discussed safety belts, safety helmets, smoke detector, smoking near bedding or upholstery.   Dental health: Discussed importance of regular tooth brushing, flossing, and dental visits.    NEXT PREVENTATIVE PHYSICAL DUE IN 1 YEAR. Return in about 4 weeks (around 07/12/2024).

## 2024-06-14 NOTE — Assessment & Plan Note (Signed)
 Rechecking labs today. Await results. Treat as needed.

## 2024-06-14 NOTE — Progress Notes (Signed)
 38yo lady with IIH which was diagnosed at [facility]. She has been getting LP's and has been intolerant to Diamox  and Topamax .  She had an MRI and MRV which shows sings of IIH. The MRV shows a dominant RIGHT side with some stenosis.  We had a lengthy discussion about her weight and I made her some recommendations.  I will give her Zonisamide 25mg  at bedtime and we will see each other in 3weeks.

## 2024-06-15 LAB — COMPREHENSIVE METABOLIC PANEL WITH GFR
ALT: 17 IU/L (ref 0–32)
AST: 16 IU/L (ref 0–40)
Albumin: 4 g/dL (ref 3.9–4.9)
Alkaline Phosphatase: 111 IU/L (ref 41–116)
BUN/Creatinine Ratio: 12 (ref 9–23)
BUN: 10 mg/dL (ref 6–20)
Bilirubin Total: 0.2 mg/dL (ref 0.0–1.2)
CO2: 21 mmol/L (ref 20–29)
Calcium: 9.2 mg/dL (ref 8.7–10.2)
Chloride: 102 mmol/L (ref 96–106)
Creatinine, Ser: 0.83 mg/dL (ref 0.57–1.00)
Globulin, Total: 2.7 g/dL (ref 1.5–4.5)
Glucose: 88 mg/dL (ref 70–99)
Potassium: 3.9 mmol/L (ref 3.5–5.2)
Sodium: 140 mmol/L (ref 134–144)
Total Protein: 6.7 g/dL (ref 6.0–8.5)
eGFR: 94 mL/min/1.73 (ref >59)

## 2024-06-15 LAB — CBC WITH DIFFERENTIAL/PLATELET
Basophils Absolute: 0.1 x10E3/uL (ref 0.0–0.2)
Basos: 1 %
EOS (ABSOLUTE): 0.1 x10E3/uL (ref 0.0–0.4)
Eos: 1 %
Hematocrit: 40.5 % (ref 34.0–46.6)
Hemoglobin: 12.9 g/dL (ref 11.1–15.9)
Immature Grans (Abs): 0 x10E3/uL (ref 0.0–0.1)
Immature Granulocytes: 0 %
Lymphocytes Absolute: 3.8 x10E3/uL — ABNORMAL HIGH (ref 0.7–3.1)
Lymphs: 25 %
MCH: 26.9 pg (ref 26.6–33.0)
MCHC: 31.9 g/dL (ref 31.5–35.7)
MCV: 85 fL (ref 79–97)
Monocytes Absolute: 0.6 x10E3/uL (ref 0.1–0.9)
Monocytes: 4 %
Neutrophils Absolute: 10.9 x10E3/uL — ABNORMAL HIGH (ref 1.4–7.0)
Neutrophils: 69 %
Platelets: 548 x10E3/uL — ABNORMAL HIGH (ref 150–450)
RBC: 4.79 x10E6/uL (ref 3.77–5.28)
RDW: 13.3 % (ref 11.7–15.4)
WBC: 15.5 x10E3/uL — ABNORMAL HIGH (ref 3.4–10.8)

## 2024-06-15 LAB — LIPID PANEL W/O CHOL/HDL RATIO
Cholesterol, Total: 203 mg/dL — ABNORMAL HIGH (ref 100–199)
HDL: 45 mg/dL (ref >39)
LDL Chol Calc (NIH): 140 mg/dL — ABNORMAL HIGH (ref 0–99)
Triglycerides: 97 mg/dL (ref 0–149)
VLDL Cholesterol Cal: 18 mg/dL (ref 5–40)

## 2024-06-15 LAB — TSH: TSH: 2.1 u[IU]/mL (ref 0.450–4.500)

## 2024-06-15 LAB — VITAMIN B12: Vitamin B-12: 360 pg/mL (ref 232–1245)

## 2024-06-16 ENCOUNTER — Other Ambulatory Visit: Payer: Self-pay | Admitting: *Deleted

## 2024-06-17 ENCOUNTER — Ambulatory Visit: Payer: Self-pay | Admitting: Family Medicine

## 2024-06-17 LAB — CYTOLOGY - PAP
Adequacy: ABSENT
Comment: NEGATIVE
Diagnosis: NEGATIVE
High risk HPV: NEGATIVE

## 2024-06-17 MED ORDER — METRONIDAZOLE 500 MG PO TABS: 500.0000 mg | ORAL_TABLET | Freq: Two times a day (BID) | ORAL | 0 refills | Status: AC

## 2024-06-17 NOTE — Patient Outreach (Signed)
 Complex Care Management   Visit Note  06/17/2024  Name:  Alexis Rangel MRN: [id number] DOB: [birth year]  Situation: Referral received for Complex Care Management related to Mental/Behavioral Health diagnosis Bipolar Disorder. Patient now receiving food stamps and Medicaid. I obtained verbal consent from Patient.  Visit completed with Patient  on the phone on 06/16/24 Background:   Past Medical History:  Diagnosis Date   Bipolar 1 disorder (HCC)    Depression    Headache    PCO (polycystic ovaries)     Assessment: Patient Reported Symptoms:  Cognitive Cognitive Status: Alert and oriented to person, place, and time, Normal speech and language skills, Insightful and able to interpret abstract concepts Cognitive/Intellectual Conditions Management [RPT]: None reported or documented in medical history or problem list   Health Maintenance Behaviors: Annual physical exam Healing Pattern: Average Health Facilitated by: Rest, Stress management  Neurological Neurological Review of Symptoms: Headaches Neurological Management Strategies: Medication therapy Neurological Comment: saw neurologist 06/14/24-stent placement with weight loss - returns 07/05/24  HEENT HEENT Symptoms Reported: Other: (per patient, ears are ringing)      Cardiovascular Cardiovascular Symptoms Reported: No symptoms reported    Respiratory Respiratory Symptoms Reported: No symptoms reported    Endocrine Endocrine Symptoms Reported: No symptoms reported    Gastrointestinal Gastrointestinal Symptoms Reported: No symptoms reported      Genitourinary Genitourinary Symptoms Reported: No symptoms reported    Integumentary Integumentary Symptoms Reported: No symptoms reported    Musculoskeletal Musculoskelatal Symptoms Reviewed: No symptoms reported        Psychosocial Psychosocial Symptoms Reported: Anxiety - if selected complete GAD Additional Psychological Details: court hearing on 07/25/24-increase anxiety  related to possible outcomes-has legal representation and family support Behavioral Management Strategies: Coping strategies, Counseling Behavioral Health Comment: patient contacted by [facility]-completed initial paper work and now waiting to be assigned to a therapist Major Change/Loss/Stressor/Fears (CP): Legal concerns Behaviors When Feeling Stressed/Fearful: talking to friends and family helps, prioritizing self care Techniques to [place] with Loss/Stress/Change: Counseling, Diversional activities Quality of Family Relationships: involved, supportive, helpful Do you feel physically threatened by others?: No    06/17/2024    PHQ2-9 Depression Screening   Little interest or pleasure in doing things More than half the days  Feeling down, depressed, or hopeless Several days  PHQ-2 - Total Score 3  Trouble falling or staying asleep, or sleeping too much Nearly every day  Feeling tired or having little energy More than half the days  Poor appetite or overeating  Several days  Feeling bad about yourself - or that you are a failure or have let yourself or your family down Several days  Trouble concentrating on things, such as reading the newspaper or watching television Several days  Moving or speaking so slowly that other people could have noticed.  Or the opposite - being so fidgety or restless that you have been moving around a lot more than usual Not at all  Thoughts that you would be better off dead, or hurting yourself in some way Not at all  PHQ2-9 Total Score 11  If you checked off any problems, how difficult have these problems made it for you to do your work, take care of things at home, or get along with other people Somewhat difficult  Depression Interventions/Treatment Counseling    There were no vitals filed for this visit.  Medications Reviewed Today     Reviewed by Ermalinda Lenn HERO, LCSW (Social Worker) on 06/16/24 at 1328  Med List  Status: <None>   Medication Order  Taking? Sig Documenting Provider Last Dose Status Informant  acetaminophen  (TYLENOL ) 650 MG suppository [id number] Yes Place 1 suppository (650 mg total) rectally every 4 (four) hours as needed. Evans, Alexandra, PA-C  Active   B-D 3CC LUER-LOK SYR 25GX1 25G X 1 3 ML MISC [id number] Yes USE TO INJECT B12 MONTHLY [provider]  Active   cariprazine  (VRAYLAR ) 1.5 MG capsule [id number] Yes Take 1 capsule (1.5 mg total) by mouth daily. Johnson, Megan P, DO  Active   cyanocobalamin  (VITAMIN B12) 1000 MCG/ML injection [id number] Yes Inject 1 mL (1,000 mcg total) into the muscle every 30 (thirty) days. Vicci, Megan P, DO  Active   cyclobenzaprine  (FLEXERIL ) 5 MG tablet [id number] Yes Take 1 tablet (5 mg total) by mouth 3 (three) times daily as needed. Brimage, Vondra, DO  Active   fluticasone  (FLONASE ) 50 MCG/ACT nasal spray [id number] Yes Place 2 sprays into both nostrils daily. Melvin Pao, NP  Active   Galcanezumab -gnlm (EMGALITY ) 120 MG/ML SOAJ [id number] Yes Inject 120 mg into the skin every 30 (thirty) days. Vicci, Megan P, DO  Active   ibuprofen  (ADVIL ) 800 MG tablet [id number] Yes Take 1 tablet (800 mg total) by mouth every 8 (eight) hours as needed. Johnson, Megan P, DO  Active   Levonorgestrel  (KYLEENA ) 19.5 MG IUD [id number] Yes 1 Device by Intrauterine route once. Lenon Delaine SAILOR, CNM  Active Pharmacy Records  naproxen  (NAPROSYN ) 500 MG tablet [id number] Yes Take 1 tablet (500 mg total) by mouth 2 (two) times daily with a meal. Melvin Pao, NP  Active   SUMAtriptan  (IMITREX ) 50 MG tablet [id number] Yes Take 1 tablet (50 mg total) by mouth every 2 (two) hours as needed for migraine. May repeat in 2 hours if headache persists or recurs. Vicci Duwaine SQUIBB, DO  Active   Syringe, Disposable, 1 ML MISC [id number] Yes Use to inject B12 monthly- please dispense appropriate needle/syringe to inject B12 Johnson, Megan P, DO  Active   zonisamide (ZONEGRAN) 25 MG capsule [id number] Yes Take 1  capsule (25 mg total) by mouth daily. Janjua, Rashid M, MD  Active             Recommendation:   PCP Follow-up Specialty provider follow-up as scheduled Ongoing Mental Health follow up with [facility]  Follow Up Plan:   Telephone follow up appointment date/time:  07/08/24 1pm  [place] Doig, LCSW [place]  [facility] Licensed Clinical Social Worker  Direct Dial: [phone]

## 2024-06-17 NOTE — Patient Instructions (Signed)
 Visit Information  Thank you for taking time to visit with me today. Please don't hesitate to contact me if I can be of assistance to you before our next scheduled appointment.  Your next care management appointment is by telephone on 11/7/ 25 at 1pm   Please call the care guide team at [phone] if you need to cancel, schedule, or reschedule an appointment.   Please call the Suicide and Crisis Lifeline: 988 call the [place]  National Suicide Prevention Lifeline: (534) 507-2588 or TTY: 218-612-4405 TTY 9416364803) to talk to a trained counselor call 1-800-273-TALK (toll free, 24 hour hotline) call 911 if you are experiencing a Mental Health or Behavioral Health Crisis or need someone to talk to.  Lawerence Dery, LCSW [place]  [place] Licensed Clinical Social Worker  Direct Dial: [phone]

## 2024-06-19 NOTE — Progress Notes (Deleted)
 New Patient Note  RE: Alexis Rangel MRN: [id number] DOB: 1986-07-31 Date of Office Visit: 06/20/2024  Consult requested by: Vicci Duwaine SQUIBB, DO Primary care provider: Vicci Duwaine SQUIBB, DO  Chief Complaint: No chief complaint on file.  History of Present Illness: I had the pleasure of seeing Alexis Rangel for initial evaluation at the [facility] on 06/20/2024. She is a 38 y.o. female, who is referred here by Vicci Duwaine SQUIBB, DO for the evaluation of ***.  Discussed the use of AI scribe software for clinical note transcription with the patient, who gave verbal consent to proceed.  History of Present Illness             ***  Assessment and Plan: Alexis Rangel is a 38 y.o. female with: ***  Assessment and Plan               No follow-ups on file.  No orders of the defined types were placed in this encounter.  Lab Orders  No laboratory test(s) ordered today    Other allergy screening: Asthma: {Blank single:19197::yes,no} Rhino conjunctivitis: {Blank single:19197::yes,no} Food allergy: {Blank single:19197::yes,no} Medication allergy: {Blank single:19197::yes,no} Hymenoptera allergy: {Blank single:19197::yes,no} Urticaria: {Blank single:19197::yes,no} Eczema:{Blank single:19197::yes,no} History of recurrent infections suggestive of immunodeficency: {Blank single:19197::yes,no}  Diagnostics: Spirometry:  Tracings reviewed. Her effort: {Blank single:19197::Good reproducible efforts.,It was hard to get consistent efforts and there is a question as to whether this reflects a maximal maneuver.,Poor effort, data can not be interpreted.} FVC: ***L FEV1: ***L, ***% predicted FEV1/FVC ratio: ***% Interpretation: {Blank single:19197::Spirometry consistent with mild obstructive disease,Spirometry consistent with moderate obstructive disease,Spirometry consistent with severe obstructive  disease,Spirometry consistent with possible restrictive disease,Spirometry consistent with mixed obstructive and restrictive disease,Spirometry uninterpretable due to technique,Spirometry consistent with normal pattern,No overt abnormalities noted given today's efforts}.  Please see scanned spirometry results for details.  Skin Testing: {Blank single:19197::Select foods,Environmental allergy panel,Environmental allergy panel and select foods,Food allergy panel,None,Deferred due to recent antihistamines use}. *** Results discussed with patient/family.   Past Medical History: Patient Active Problem List   Diagnosis Date Noted  . IFG (impaired fasting glucose) 06/14/2024  . Family history of heart disease 11/11/2022  . Polycystic ovary syndrome 10/17/2022  . Benign intracranial hypertension 01/07/2022  . Arthralgia of right ankle 06/13/2021  . Inflammatory pain 06/13/2021  . Sprain of right ankle 06/13/2021  . Pseudotumor cerebri 03/18/2020  . B12 deficiency 01/17/2019  . Bipolar 2 disorder (HCC) 06/11/2017  . Idiopathic intracranial hypertension 05/25/2017  . Papilledema 05/01/2017  . Pelvic pain in female 05/20/2016  . Hypocalcemia 03/11/2016  . Hyperinsulinemia 03/11/2016  . Morbid obesity (HCC) 10/17/2015  . Migraine 10/17/2015  . Cholelithiasis 10/16/2015   Past Medical History:  Diagnosis Date  . Bipolar 1 disorder (HCC)   . Depression   . Headache   . PCO (polycystic ovaries)    Past Surgical History: Past Surgical History:  Procedure Laterality Date  . ccy  06/09/2014   pt states she does not remember this surgery  . CHOLECYSTECTOMY    . TONSILLECTOMY AND ADENOIDECTOMY    . WISDOM TOOTH EXTRACTION     Medication List:  Current Outpatient Medications  Medication Sig Dispense Refill  . acetaminophen  (TYLENOL ) 650 MG suppository Place 1 suppository (650 mg total) rectally every 4 (four) hours as needed. 12 suppository 0  . B-D 3CC LUER-LOK SYR  25GX1 25G X 1 3 ML MISC USE TO INJECT B12 MONTHLY    . cariprazine  (VRAYLAR ) 1.5 MG capsule Take  1 capsule (1.5 mg total) by mouth daily. 30 capsule 0  . cyanocobalamin  (VITAMIN B12) 1000 MCG/ML injection Inject 1 mL (1,000 mcg total) into the muscle every 30 (thirty) days. 1 mL 2  . cyclobenzaprine  (FLEXERIL ) 5 MG tablet Take 1 tablet (5 mg total) by mouth 3 (three) times daily as needed. 30 tablet 0  . fluticasone  (FLONASE ) 50 MCG/ACT nasal spray Place 2 sprays into both nostrils daily. 16 g 6  . Galcanezumab -gnlm (EMGALITY ) 120 MG/ML SOAJ Inject 120 mg into the skin every 30 (thirty) days. 1.12 mL 12  . ibuprofen  (ADVIL ) 800 MG tablet Take 1 tablet (800 mg total) by mouth every 8 (eight) hours as needed. 90 tablet 3  . Levonorgestrel  (KYLEENA ) 19.5 MG IUD 1 Device by Intrauterine route once. 1 Intra Uterine Device 0  . metroNIDAZOLE (FLAGYL) 500 MG tablet Take 1 tablet (500 mg total) by mouth 2 (two) times daily for 7 days. 14 tablet 0  . naproxen  (NAPROSYN ) 500 MG tablet Take 1 tablet (500 mg total) by mouth 2 (two) times daily with a meal. 30 tablet 0  . SUMAtriptan  (IMITREX ) 50 MG tablet Take 1 tablet (50 mg total) by mouth every 2 (two) hours as needed for migraine. May repeat in 2 hours if headache persists or recurs. 10 tablet 6  . Syringe, Disposable, 1 ML MISC Use to inject B12 monthly- please dispense appropriate needle/syringe to inject B12 3 each 0  . zonisamide (ZONEGRAN) 25 MG capsule Take 1 capsule (25 mg total) by mouth daily. 30 capsule 3   No current facility-administered medications for this visit.   Allergies: Allergies  Allergen Reactions  . Topamax  [Topiramate ] Other (See Comments)    Paresthesias    Social History: Social History   Socioeconomic History  . Marital status: Single    Spouse name: Not on file  . Number of children: 0  . Years of education: Not on file  . Highest education level: Not on file  Occupational History    Employer: [employer]   Tobacco Use  . Smoking status: Never    Passive exposure: Current  . Smokeless tobacco: Never  Vaping Use  . Vaping status: Never Used  Substance and Sexual Activity  . Alcohol use: Not Currently    Comment: occasionally  . Drug use: Never  . Sexual activity: Yes    Birth control/protection: I.U.D.    Comment: kyleena - inserted 2022  Other Topics Concern  . Not on file  Social History Narrative  . Not on file   Social Drivers of Health   Financial Resource Strain: Low Risk  (06/14/2024)   Overall Financial Resource Strain (CARDIA)   . Difficulty of Paying Living Expenses: Not very hard  Food Insecurity: Food Insecurity Present (04/22/2024)   Hunger Vital Sign   . Worried About Programme researcher, broadcasting/film/video in the Last Year: Sometimes true   . Ran Out of Food in the Last Year: Never true  Transportation Needs: No Transportation Needs (04/22/2024)   PRAPARE - Transportation   . Lack of Transportation (Medical): No   . Lack of Transportation (Non-Medical): No  Physical Activity: Sufficiently Active (06/14/2024)   Exercise Vital Sign   . Days of Exercise per Week: 7 days   . Minutes of Exercise per Session: 30 min  Stress: Not on file  Social Connections: Socially Isolated (06/14/2024)   Social Connection and Isolation Panel   . Frequency of Communication with Friends and Family: More than three times a week   .  Frequency of Social Gatherings with Friends and Family: More than three times a week   . Attends Religious Services: Never   . Active Member of Clubs or Organizations: No   . Attends Banker Meetings: Never   . Marital Status: Never married   Lives in a ***. Smoking: *** Occupation: ***  Environmental HistorySurveyor, minerals in the house: Network engineer in the family room: {Blank single:19197::yes,no} Carpet in the bedroom: {Blank single:19197::yes,no} Heating: {Blank single:19197::electric,gas,heat  pump} Cooling: {Blank single:19197::central,window,heat pump} Pet: {Blank single:19197::yes ***,no}  Family History: Family History  Problem Relation Age of Onset  . Cancer Mother        cervical  . COPD Mother   . Hyperlipidemia Father   . Hypertension Father   . Migraines Father   . Heart attack Father   . Bipolar disorder Sister   . Heart attack Maternal Uncle   . Heart attack Paternal Uncle   . Stroke Paternal Uncle   . Heart disease Maternal Grandmother   . COPD Maternal Grandmother   . Heart disease Maternal Grandfather   . Hypertension Paternal Grandmother   . Heart disease Paternal Grandmother   . Hypertension Paternal Grandfather   . Heart disease Paternal Grandfather   . Alzheimer's disease Paternal Grandfather   . Diabetes Neg Hx    Problem                               Relation Asthma                                   *** Eczema                                *** Food allergy                          *** Allergic rhino conjunctivitis     ***  Review of Systems  Constitutional:  Negative for appetite change, chills, fever and unexpected weight change.  HENT:  Negative for congestion and rhinorrhea.   Eyes:  Negative for itching.  Respiratory:  Negative for cough, chest tightness, shortness of breath and wheezing.   Cardiovascular:  Negative for chest pain.  Gastrointestinal:  Negative for abdominal pain.  Genitourinary:  Negative for difficulty urinating.  Skin:  Negative for rash.  Neurological:  Negative for headaches.    Objective: There were no vitals taken for this visit. There is no height or weight on file to calculate BMI. Physical Exam Vitals and nursing note reviewed.  Constitutional:      Appearance: Normal appearance. She is well-developed.  HENT:     Head: Normocephalic and atraumatic.     Right Ear: Tympanic membrane and external ear normal.     Left Ear: Tympanic membrane and external ear normal.     Nose: Nose normal.      Mouth/Throat:     Mouth: Mucous membranes are moist.     Pharynx: Oropharynx is clear.  Eyes:     Conjunctiva/sclera: Conjunctivae normal.  Cardiovascular:     Rate and Rhythm: Normal rate and regular rhythm.     Heart sounds: Normal heart sounds. No murmur heard.    No friction rub. No gallop.  Pulmonary:  Effort: Pulmonary effort is normal.     Breath sounds: Normal breath sounds. No wheezing, rhonchi or rales.  Musculoskeletal:     Cervical back: Neck supple.  Skin:    General: Skin is warm.     Findings: No rash.  Neurological:     Mental Status: She is alert and oriented to person, place, and time.  Psychiatric:        Behavior: Behavior normal.   The plan was reviewed with the patient/family, and all questions/concerned were addressed.  It was my pleasure to see Alexis Rangel today and participate in her care. Please feel free to contact me with any questions or concerns.  Sincerely,  Orlan Cramp, DO Allergy & Immunology  [facility]  [facility] office: [phone] [facility] office: [phone]

## 2024-06-20 ENCOUNTER — Ambulatory Visit: Admitting: Allergy

## 2024-07-02 NOTE — Progress Notes (Unsigned)
   07/02/2024 Name: Alexis Rangel MRN: [id number] DOB: September 21, 1985  No chief complaint on file.  Alexis Rangel is a 38 y.o. year old female who presented for a telephone visit.   They were referred to the pharmacist by their PCP for assistance in managing medication access.   Subjective:  Care Team: Primary Care Provider: Vicci Duwaine SQUIBB, DO ; Next Scheduled Visit: 9/23  Medication Access/Adherence  Current Pharmacy:  [facility] [address], [place] - [address] [facility] [address] [place] [address] Phone: [phone] Fax: [phone]  [facility] [id number] [place], [place] - [address] [address] Phone: [phone] Fax: [phone]  [facility] [place], [place] - [address] [place] [address] Phone: [phone] Fax: [phone]  [email or web address] [place], [place] - [address] [place] [address] Phone: [phone] Fax: [phone]  -Patient reports affordability concerns with their medications: Yes  -Patient reports access/transportation concerns to their pharmacy: No  -Patient reports adherence concerns with their medications:  Yes    Medication Management: -Patient was recently approved for Medicaid but had been without insurance and was unable to fill some medications based on cost -Recently prescribed Vraylar  1.5mg  daily for bipolar, and she has picked this medication up but has not started taking.  Patient endorses concern with potential weight gain associated with medication and states she has gained weight and does not wish to gain additional. -Patient was taking Wegovy  and b12 injections in the past, which helped with weight gain; but Wegovy  was stopped due to availability issues -Patient is prescribed Emgality  120mg  monthly for migraines and states this medication has been very  beneficial, but she has been without the mediation since the end of June due to cost  Objective:  Lab Results  Component Value Date   HGBA1C 5.6 06/14/2024   Lab Results  Component Value Date   CREATININE 0.83 06/14/2024   BUN 10 06/14/2024   NA 140 06/14/2024   K 3.9 06/14/2024   CL 102 06/14/2024   CO2 21 06/14/2024   Lab Results  Component Value Date   CHOL 203 (H) 06/14/2024   HDL 45 06/14/2024   LDLCALC 140 (H) 06/14/2024   TRIG 97 06/14/2024   CHOLHDL 5.4 (H) 07/16/2021   Medications Reviewed Today   Medications were not reviewed in this encounter    Assessment/Plan:   Medication Management: -Advised patient that Vraylar  (like many mediations in the same drug class) does have the risk of causing weight gain, but this is patient specific and does not occur with everyone.  Suggested taking mediation for 1 month to weigh pros and cons.  Patient is in agreement. -Prior authorization submitted and approved for Emgality ; I recommend resuming 120mg  monthly dose -I recommend resuming Wegovy  at 0.25mg  dose and titrating based on results; PA has been approved by patient's new Medicaid plan  Patient would also like to resume monthly B12 injections (last B12 labs were drawn in 2024 and value was low at 151).  Orders pending for PCP to sign if in agreement.  Follow Up Plan: Will notify patient in regard to PA's and initiation of Wegovy  and schedule a 4 week telephone follow-up  Channing DELENA Mealing, PharmD, DPLA

## 2024-07-04 ENCOUNTER — Other Ambulatory Visit: Payer: Self-pay

## 2024-07-05 ENCOUNTER — Encounter: Payer: Self-pay | Admitting: Neurosurgery

## 2024-07-05 ENCOUNTER — Ambulatory Visit (INDEPENDENT_AMBULATORY_CARE_PROVIDER_SITE_OTHER): Admitting: Neurosurgery

## 2024-07-05 VITALS — BP 123/83 | HR 80 | Ht 62.0 in | Wt 237.0 lb

## 2024-07-05 DIAGNOSIS — G932 Benign intracranial hypertension: Secondary | ICD-10-CM

## 2024-07-05 NOTE — Progress Notes (Signed)
 38 year old lady with idiopathic intracranial hypertension, venous stenosis and intolerance for Diamox  and Topamax .  I started on zonisamide 25 mg.  She states that prior to initiation of the treatment she was having headaches every day and now she only has them once or twice a week.  She is very active now and has changed her diet.  She is very happy with her accomplishments as she has also lost some weight.  Really happy to hear how well she is doing.  We talked about escalating the dose to 50 mg versus keeping it at the current dose and decided to leave it at 25 mg.  I will talk to her again in 6 weeks and I gave her some recommendations with regards to activity and diet and we will talk to her again in 6 weeks.  She prefers to do this by phone.

## 2024-07-08 ENCOUNTER — Other Ambulatory Visit: Payer: Self-pay | Admitting: *Deleted

## 2024-07-08 NOTE — Patient Outreach (Addendum)
 Complex Care Management   Visit Note  07/08/2024  Name:  Alexis Rangel MRN: [id number] DOB: [birth year]  Situation: Referral received for Complex Care Management related to Mental/Behavioral Health diagnosis anxiety I obtained verbal consent from Patient.  Visit completed with Patient  on the phone  Background:   Past Medical History:  Diagnosis Date   Bipolar 1 disorder (HCC)    Depression    Headache    PCO (polycystic ovaries)     Assessment: Patient Reported Symptoms:  Cognitive Cognitive Status: Alert and oriented to person, place, and time, Normal speech and language skills, Insightful and able to interpret abstract concepts Cognitive/Intellectual Conditions Management [RPT]: None reported or documented in medical history or problem list   Healing Pattern: Average Health Facilitated by: Rest, Stress management  Neurological   Neurological Comment: saw Neurologist on 07/05/24 prescribed different medicine to help release head pressure  HEENT HEENT Symptoms Reported: Other: (per patient, experiencing ringing in her ears due to fluid pressure) HEENT Management Strategies: Coping strategies    Cardiovascular Cardiovascular Symptoms Reported: No symptoms reported    Respiratory Respiratory Symptoms Reported: No symptoms reported    Endocrine Endocrine Symptoms Reported: No symptoms reported Is patient diabetic?: No    Gastrointestinal Gastrointestinal Symptoms Reported: No symptoms reported      Genitourinary Genitourinary Symptoms Reported: No symptoms reported    Integumentary Integumentary Symptoms Reported: No symptoms reported    Musculoskeletal Musculoskelatal Symptoms Reviewed: No symptoms reported        Psychosocial Psychosocial Symptoms Reported: Anxiety - if selected complete GAD Additional Psychological Details: mood has been good, less fatigue since finding a job -court hearing scheduled for 07/25/24 contiues to have legal representation and good  family support Behavioral Management Strategies: Coping strategies Behavioral Health Comment: started group therapy through [facility] from 5:30-6:30pm-per [facility], patient has been assigned a therapist and will reach out to patient to schedule iniital appointment Major Change/Loss/Stressor/Fears (CP): Legal concerns Behaviors When Feeling Stressed/Fearful: talking to friend and family helps, now has a job at [employer]-helps with mood as well Techniques to [employer] with Loss/Stress/Change: Counseling, Diversional activities Quality of Family Relationships: involved, supportive, helpful Do you feel physically threatened by others?: No    07/08/2024    PHQ2-9 Depression Screening   Little interest or pleasure in doing things    Feeling down, depressed, or hopeless    PHQ-2 - Total Score    Trouble falling or staying asleep, or sleeping too much    Feeling tired or having little energy    Poor appetite or overeating     Feeling bad about yourself - or that you are a failure or have let yourself or your family down    Trouble concentrating on things, such as reading the newspaper or watching television    Moving or speaking so slowly that other people could have noticed.  Or the opposite - being so fidgety or restless that you have been moving around a lot more than usual    Thoughts that you would be better off dead, or hurting yourself in some way    PHQ2-9 Total Score    If you checked off any problems, how difficult have these problems made it for you to do your work, take care of things at home, or get along with other people    Depression Interventions/Treatment      There were no vitals filed for this visit.  Medications Reviewed Today     Reviewed by Ermalinda,  Greidy Sherard M, LCSW (Social Worker) on 07/08/24 at 1312  Med List Status: <None>   Medication Order Taking? Sig Documenting Provider Last Dose Status Informant  acetaminophen  (TYLENOL ) 650 MG suppository  [id number] Yes Place 1 suppository (650 mg total) rectally every 4 (four) hours as needed. Evans, Alexandra, PA-C  Active   B-D 3CC LUER-LOK SYR 25GX1 25G X 1 3 ML MISC [id number]  USE TO INJECT B12 MONTHLY  Patient not taking: Reported on 07/08/2024   [provider]  Active   cariprazine  (VRAYLAR ) 1.5 MG capsule [id number] Yes Take 1 capsule (1.5 mg total) by mouth daily. Johnson, Megan P, DO  Active   cyanocobalamin  (VITAMIN B12) 1000 MCG/ML injection [id number]  Inject 1 mL (1,000 mcg total) into the muscle every 30 (thirty) days.  Patient not taking: Reported on 07/08/2024   Vicci Duwaine SQUIBB, DO  Active   cyclobenzaprine  (FLEXERIL ) 5 MG tablet [id number] Yes Take 1 tablet (5 mg total) by mouth 3 (three) times daily as needed. Brimage, Vondra, DO  Active   fluticasone  (FLONASE ) 50 MCG/ACT nasal spray [id number] Yes Place 2 sprays into both nostrils daily. Melvin Pao, NP  Active   Galcanezumab -gnlm (EMGALITY ) 120 MG/ML SOAJ [id number] Yes Inject 120 mg into the skin every 30 (thirty) days. Vicci, Megan P, DO  Active   ibuprofen  (ADVIL ) 800 MG tablet [id number] Yes Take 1 tablet (800 mg total) by mouth every 8 (eight) hours as needed. Johnson, Megan P, DO  Active   Levonorgestrel  (KYLEENA ) 19.5 MG IUD [id number] Yes 1 Device by Intrauterine route once. Lenon Delaine SAILOR, CNM  Active Pharmacy Records  naproxen  (NAPROSYN ) 500 MG tablet [id number] Yes Take 1 tablet (500 mg total) by mouth 2 (two) times daily with a meal. Melvin Pao, NP  Active   SUMAtriptan  (IMITREX ) 50 MG tablet [id number] Yes Take 1 tablet (50 mg total) by mouth every 2 (two) hours as needed for migraine. May repeat in 2 hours if headache persists or recurs. Vicci Duwaine SQUIBB, DO  Active   Syringe, Disposable, 1 ML MISC [id number]  Use to inject B12 monthly- please dispense appropriate needle/syringe to inject B12  Patient not taking: Reported on 07/08/2024   Vicci Duwaine SQUIBB, DO  Active   zonisamide (ZONEGRAN) 25 MG  capsule [id number] Yes Take 1 capsule (25 mg total) by mouth daily. Janjua, Rashid M, MD  Active             Recommendation:   PCP Follow-up Specialty provider follow-up as scheduled [facility] for ongoing mental health support  Follow Up Plan:   Telephone follow up appointment date/time:  08/02/24  Lenn Mean, LCSW [place]  [facility] Licensed Clinical Social Worker  Direct Dial: [phone]

## 2024-07-08 NOTE — Patient Instructions (Signed)
 Visit Information  Thank you for taking time to visit with me today. Please don't hesitate to contact me if I can be of assistance to you before our next scheduled appointment.  Your next care management appointment is by telephone on 08/02/24 at 1pm   Please call the care guide team at [phone] if you need to cancel, schedule, or reschedule an appointment.   Please call the Suicide and Crisis Lifeline: 988 call the [place]  National Suicide Prevention Lifeline: 6134219137 or TTY: (423)029-2685 TTY [phone]) to talk to a trained counselor call 1-800-273-TALK (toll free, 24 hour hotline) call 911 if you are experiencing a Mental Health or Behavioral Health Crisis or need someone to talk to.  Alyia Lacerte, LCSW [place]  [facility], [facility] Licensed Clinical Social Worker  Direct Dial: [phone]

## 2024-07-13 ENCOUNTER — Ambulatory Visit: Admitting: Allergy

## 2024-07-14 ENCOUNTER — Encounter: Payer: Self-pay | Admitting: Family Medicine

## 2024-07-14 ENCOUNTER — Ambulatory Visit (INDEPENDENT_AMBULATORY_CARE_PROVIDER_SITE_OTHER): Admitting: Family Medicine

## 2024-07-14 DIAGNOSIS — G932 Benign intracranial hypertension: Secondary | ICD-10-CM | POA: Diagnosis not present

## 2024-07-14 DIAGNOSIS — R0683 Snoring: Secondary | ICD-10-CM

## 2024-07-14 DIAGNOSIS — F3181 Bipolar II disorder: Secondary | ICD-10-CM | POA: Diagnosis not present

## 2024-07-14 MED ORDER — NALTREXONE HCL 50 MG PO TABS: 25.0000 mg | ORAL_TABLET | Freq: Every day | ORAL | 2 refills | Status: DC

## 2024-07-14 MED ORDER — CARIPRAZINE HCL 1.5 MG PO CAPS: 1.5000 mg | ORAL_CAPSULE | ORAL | 0 refills | Status: AC

## 2024-07-14 MED ORDER — VITAMIN B-12 1000 MCG PO TABS: 1000.0000 ug | ORAL_TABLET | Freq: Every day | ORAL | 1 refills | Status: AC

## 2024-07-14 MED ORDER — BUPROPION HCL ER (SR) 150 MG PO TB12: ORAL_TABLET | ORAL | 2 refills | Status: DC

## 2024-07-14 NOTE — Progress Notes (Signed)
 BP 132/75   Pulse (!) 103   Temp 98 F (36.7 C) (Oral)   Ht 5' 2 (1.575 m)   Wt 237 lb 3.2 oz (107.6 kg)   SpO2 99%   BMI 43.38 kg/m    Subjective:    Patient ID: Alexis Rangel, female    DOB: 1986/08/15, 38 y.o.   MRN: [id number]  HPI: Alexis Rangel is a 38 y.o. female  Chief Complaint  Patient presents with   Obesity    Would like to discuss other weight loss options    ANXIETY/BIPOLAR Duration: chronic Status:stable Anxious mood: yes  Excessive worrying: no Irritability: yes  Sweating: no Nausea: no Palpitations:no Hyperventilation: no Panic attacks: no Agoraphobia: no  Obscessions/compulsions: no Depressed mood: yes    07/14/2024    3:57 PM 06/16/2024    1:48 PM 04/22/2024   10:56 AM 01/14/2024    3:31 PM 12/30/2023   10:34 AM  Depression screen PHQ 2/9  Decreased Interest 0 2 2 1 1   Down, Depressed, Hopeless 0 1 3 1 1   PHQ - 2 Score 0 3 5 2 2   Altered sleeping 0 3 3 0 2  Tired, decreased energy 0 2 3 1 1   Change in appetite 0 1 1 2  0  Feeling bad or failure about yourself  0 1 3 0 0  Trouble concentrating 0 1 1 1 1   Moving slowly or fidgety/restless 0 0 0 0 0  Suicidal thoughts 0 0 0 0 0  PHQ-9 Score 0 11  16  6  6    Difficult doing work/chores Not difficult at all Somewhat difficult  Not difficult at all Not difficult at all     Data saved with a previous flowsheet row definition   Anhedonia: no Weight changes: yes Insomnia: yes   Hypersomnia: yes Fatigue/loss of energy: yes Feelings of worthlessness: no Feelings of guilt: no Impaired concentration/indecisiveness: no Suicidal ideations: no  Crying spells: no Recent Stressors/Life Changes: no   Relationship problems: no   Family stress: no     Financial stress: no    Job stress: no    Recent death/loss: no  ???SLEEP APNEA Sleep apnea status: unknown Duration: chronic Satisfied with current treatment?:  N/A Last sleep study: never Treatments attempted: none Wakes feeling  refreshed:  no Daytime hypersomnolence:  yes Fatigue:  yes Insomnia:  yes Good sleep hygiene:  yes Difficulty falling asleep:  yes Difficulty staying asleep:  yes Snoring bothers bed partner:  yes Observed apnea by bed partner: yes Obesity:  yes Hypertension: yes  Pulmonary hypertension:  no Coronary artery disease:  no  OBESITY Duration: chronic Previous attempts at weight loss: yes Complications of obesity: Idiopathic intracranial hypertension, pseudotumor cerebri, IFG Peak weight: 250 Weight loss goal: 175 Weight loss to date: 13lbs Requesting obesity pharmacotherapy: yes Current weight loss supplements/medications: no Previous weight loss supplements/meds: no    Relevant past medical, surgical, family and social history reviewed and updated as indicated. Interim medical history since our last visit reviewed. Allergies and medications reviewed and updated.  Review of Systems  Constitutional:  Positive for fatigue. Negative for activity change, appetite change, chills, diaphoresis, fever and unexpected weight change.  Respiratory: Negative.    Cardiovascular: Negative.   Musculoskeletal: Negative.   Neurological: Negative.   Psychiatric/Behavioral: Negative.      Per HPI unless specifically indicated above     Objective:    BP 132/75   Pulse (!) 103   Temp 98 F (36.7  C) (Oral)   Ht 5' 2 (1.575 m)   Wt 237 lb 3.2 oz (107.6 kg)   SpO2 99%   BMI 43.38 kg/m   Wt Readings from Last 3 Encounters:  07/14/24 237 lb 3.2 oz (107.6 kg)  07/05/24 237 lb (107.5 kg)  06/14/24 239 lb (108.4 kg)    Physical Exam Vitals and nursing note reviewed.  Constitutional:      General: She is not in acute distress.    Appearance: Normal appearance. She is obese. She is not ill-appearing, toxic-appearing or diaphoretic.  HENT:     Head: Normocephalic and atraumatic.     Right Ear: External ear normal.     Left Ear: External ear normal.     Nose: Nose normal.      Mouth/Throat:     Mouth: Mucous membranes are moist.     Pharynx: Oropharynx is clear.  Eyes:     General: No scleral icterus.       Right eye: No discharge.        Left eye: No discharge.     Extraocular Movements: Extraocular movements intact.     Conjunctiva/sclera: Conjunctivae normal.     Pupils: Pupils are equal, round, and reactive to light.  Cardiovascular:     Rate and Rhythm: Normal rate and regular rhythm.     Pulses: Normal pulses.     Heart sounds: Normal heart sounds. No murmur heard.    No friction rub. No gallop.  Pulmonary:     Effort: Pulmonary effort is normal. No respiratory distress.     Breath sounds: Normal breath sounds. No stridor. No wheezing, rhonchi or rales.  Chest:     Chest wall: No tenderness.  Musculoskeletal:        General: Normal range of motion.     Cervical back: Normal range of motion and neck supple.  Skin:    General: Skin is warm and dry.     Capillary Refill: Capillary refill takes less than 2 seconds.     Coloration: Skin is not jaundiced or pale.     Findings: No bruising, erythema, lesion or rash.  Neurological:     General: No focal deficit present.     Mental Status: She is alert and oriented to person, place, and time. Mental status is at baseline.  Psychiatric:        Mood and Affect: Mood normal.        Behavior: Behavior normal.        Thought Content: Thought content normal.        Judgment: Judgment normal.     Results for orders placed or performed in visit on 06/14/24  Cytology - PAP   Collection Time: 06/14/24  4:05 PM  Result Value Ref Range   High risk HPV Negative    Adequacy      Satisfactory for evaluation; transformation zone component ABSENT.   Diagnosis      - Negative for intraepithelial lesion or malignancy (NILM)   Microorganisms Shift in flora suggestive of bacterial vaginosis    Comment Normal Reference Range HPV - Negative   Bayer DCA Hb A1c Waived   Collection Time: 06/14/24  4:08 PM  Result  Value Ref Range   HB A1C (BAYER DCA - WAIVED) 5.6 4.8 - 5.6 %  CBC with Differential/Platelet   Collection Time: 06/14/24  4:10 PM  Result Value Ref Range   WBC 15.5 (H) 3.4 - 10.8 x10E3/uL   RBC 4.79 3.77 -  5.28 x10E6/uL   Hemoglobin 12.9 11.1 - 15.9 g/dL   Hematocrit 59.4 65.9 - 46.6 %   MCV 85 79 - 97 fL   MCH 26.9 26.6 - 33.0 pg   MCHC 31.9 31.5 - 35.7 g/dL   RDW 86.6 88.2 - 84.5 %   Platelets 548 (H) 150 - 450 x10E3/uL   Neutrophils 69 Not Estab. %   Lymphs 25 Not Estab. %   Monocytes 4 Not Estab. %   Eos 1 Not Estab. %   Basos 1 Not Estab. %   Neutrophils Absolute 10.9 (H) 1.4 - 7.0 x10E3/uL   Lymphocytes Absolute 3.8 (H) 0.7 - 3.1 x10E3/uL   Monocytes Absolute 0.6 0.1 - 0.9 x10E3/uL   EOS (ABSOLUTE) 0.1 0.0 - 0.4 x10E3/uL   Basophils Absolute 0.1 0.0 - 0.2 x10E3/uL   Immature Granulocytes 0 Not Estab. %   Immature Grans (Abs) 0.0 0.0 - 0.1 x10E3/uL  Comprehensive metabolic panel with GFR   Collection Time: 06/14/24  4:10 PM  Result Value Ref Range   Glucose 88 70 - 99 mg/dL   BUN 10 6 - 20 mg/dL   Creatinine, Ser 9.16 0.57 - 1.00 mg/dL   eGFR 94 >40 fO/fpw/8.26   BUN/Creatinine Ratio 12 9 - 23   Sodium 140 134 - 144 mmol/L   Potassium 3.9 3.5 - 5.2 mmol/L   Chloride 102 96 - 106 mmol/L   CO2 21 20 - 29 mmol/L   Calcium 9.2 8.7 - 10.2 mg/dL   Total Protein 6.7 6.0 - 8.5 g/dL   Albumin 4.0 3.9 - 4.9 g/dL   Globulin, Total 2.7 1.5 - 4.5 g/dL   Bilirubin Total 0.2 0.0 - 1.2 mg/dL   Alkaline Phosphatase 111 41 - 116 IU/L   AST 16 0 - 40 IU/L   ALT 17 0 - 32 IU/L  Lipid Panel w/o Chol/HDL Ratio   Collection Time: 06/14/24  4:10 PM  Result Value Ref Range   Cholesterol, Total 203 (H) 100 - 199 mg/dL   Triglycerides 97 0 - 149 mg/dL   HDL 45 >60 mg/dL   VLDL Cholesterol Cal 18 5 - 40 mg/dL   LDL Chol Calc (NIH) 859 (H) 0 - 99 mg/dL  TSH   Collection Time: 06/14/24  4:10 PM  Result Value Ref Range   TSH 2.100 0.450 - 4.500 uIU/mL  B12   Collection Time:  06/14/24  4:10 PM  Result Value Ref Range   Vitamin B-12 360 232 - 1,245 pg/mL      Assessment & Plan:   Problem List Items Addressed This Visit       Nervous and Auditory   Idiopathic intracranial hypertension   Following with neurosurgery. Improving. Call with any concerns.         Other   Morbid obesity (HCC) - Primary   Will start wellbutrin and naltrexone. Recheck 1 month. Call with any concerns.       Bipolar 2 disorder (HCC)   Under good control on current regimen. Continue current regimen. Continue to monitor. Call with any concerns. Refills given. Will start wellbutrin to help with weight. Recheck 1 month.        Other Visit Diagnoses       Snoring       Concern for OSA- will check sleep study. Await results. Treat as needed.   Relevant Orders   Ambulatory referral to Sleep Studies        Follow up plan: Return 6-8 weeks.

## 2024-07-18 NOTE — Assessment & Plan Note (Signed)
 Under good control on current regimen. Continue current regimen. Continue to monitor. Call with any concerns. Refills given. Will start wellbutrin to help with weight. Recheck 1 month.

## 2024-07-18 NOTE — Assessment & Plan Note (Signed)
 Will start wellbutrin and naltrexone. Recheck 1 month. Call with any concerns.

## 2024-07-18 NOTE — Assessment & Plan Note (Signed)
 Following with neurosurgery. Improving. Call with any concerns.

## 2024-07-19 ENCOUNTER — Other Ambulatory Visit (HOSPITAL_COMMUNITY): Payer: Self-pay

## 2024-07-27 ENCOUNTER — Ambulatory Visit: Payer: Self-pay

## 2024-07-27 NOTE — Telephone Encounter (Signed)
 FYI Only or Action Required?: Action required by provider: clinical question for provider.  Patient was last seen in primary care on 07/14/2024 by Vicci Duwaine SQUIBB, DO.  Called Nurse Triage reporting Nausea and Vomiting.  Symptoms began yesterday.  Interventions attempted: Nothing.  Symptoms are: stable.  Triage Disposition: Call PCP Within 24 Hours  Patient/caregiver understands and will follow disposition?: Yes

## 2024-07-27 NOTE — Telephone Encounter (Signed)
 Message from Alexis Rangel sent at 07/27/2024  3:30 PM EST  Summary: rx concern   Reason for Triage: The patient shares that they have recently began taking new medications that they believe may be making them sick. The patient shares that they experienced nausea and vomiting this morning and previously last week. The patient is uncertain of which medication is specifically making them sick and would like to discuss further         Reason for Disposition  Vomiting a prescription medication  Answer Assessment - Initial Assessment Questions 1. VOMITING SEVERITY: How many times have you vomited in the past 24 hours?      1 time 2. ONSET: When did the vomiting begin?      Yesterday 3. FLUIDS: What fluids or food have you vomited up today? Have you been able to keep any fluids down?     Yes 4. ABDOMEN PAIN: Are your having any abdomen pain? If Yes : How bad is it and what does it feel like? (e.g., crampy, dull, intermittent, constant)      No 5. DIARRHEA: Is there any diarrhea? If Yes, ask: How many times today?      A little bit-thinks it's unrelated 6. CONTACTS: Is there anyone else in the family with the same symptoms?      No 7. CAUSE: What do you think is causing your vomiting?     New medication 8. HYDRATION STATUS: Any signs of dehydration? (e.g., dry mouth [not only dry lips], too weak to stand) When did you last urinate?     No 9. OTHER SYMPTOMS: Do you have any other symptoms? (e.g., fever, headache, vertigo, vomiting blood or coffee grounds, recent head injury)     No 10. PREGNANCY: Is there any chance you are pregnant? When was your last menstrual period?       No  Protocols used: Vomiting-A-AH

## 2024-08-02 ENCOUNTER — Other Ambulatory Visit: Payer: Self-pay | Admitting: *Deleted

## 2024-08-02 NOTE — Patient Outreach (Signed)
 Complex Care Management   Visit Note  08/02/2024  Name:  Alexis Rangel MRN: [id number] DOB: 1985-09-22  Situation: Referral received for Complex Care Management related to Mental/Behavioral Health diagnosis Anxiety  I obtained verbal consent from Patient.  Visit completed with Patient  on the phone  Background:   Past Medical History:  Diagnosis Date   Bipolar 1 disorder (HCC)    Depression    Headache    PCO (polycystic ovaries)     Assessment: Patient Reported Symptoms:  Cognitive Cognitive Status: Alert and oriented to person, place, and time, Normal speech and language skills, Insightful and able to interpret abstract concepts Cognitive/Intellectual Conditions Management [RPT]: None reported or documented in medical history or problem list   Health Maintenance Behaviors: None Health Facilitated by: Stress management  Neurological Neurological Review of Symptoms: Headaches Neurological Comment: migraines continue but taking medicines to manage headaches  HEENT HEENT Symptoms Reported: No symptoms reported      Cardiovascular Cardiovascular Symptoms Reported: No symptoms reported    Respiratory Respiratory Symptoms Reported: No symptoms reported    Endocrine Endocrine Symptoms Reported: No symptoms reported Is patient diabetic?: No    Gastrointestinal Gastrointestinal Symptoms Reported: Nausea Additional Gastrointestinal Details: with weight loss medication prescribed-stopped taking the medicine until she follows up with primary      Genitourinary Genitourinary Symptoms Reported: No symptoms reported    Integumentary Integumentary Symptoms Reported: No symptoms reported    Musculoskeletal Musculoskelatal Symptoms Reviewed: No symptoms reported        Psychosocial Psychosocial Symptoms Reported: Depression - if selected complete PHQ 2-9 Additional Psychological Details: reports  that final court date was on 07/25/24-Now active with [facility] for ongoing  therapy and group theapy -also now gainfully employed Engineer, [employer] Strategies: Coping strategies, Counseling, Medication therapy Behavioral Health Self-Management Outcome: 4 (good) Behaviors When Feeling Stressed/Fearful: practicing self care, counseling and group counseling, Techniques to [employer] with Loss/Stress/Change: Diversional activities Quality of Family Relationships: supportive Do you feel physically threatened by others?: No    08/02/2024    PHQ2-9 Depression Screening   Little interest or pleasure in doing things Not at all  Feeling down, depressed, or hopeless Several days  PHQ-2 - Total Score 1  Trouble falling or staying asleep, or sleeping too much    Feeling tired or having little energy    Poor appetite or overeating     Feeling bad about yourself - or that you are a failure or have let yourself or your family down    Trouble concentrating on things, such as reading the newspaper or watching television    Moving or speaking so slowly that other people could have noticed.  Or the opposite - being so fidgety or restless that you have been moving around a lot more than usual    Thoughts that you would be better off dead, or hurting yourself in some way    PHQ2-9 Total Score    If you checked off any problems, how difficult have these problems made it for you to do your work, take care of things at home, or get along with other people    Depression Interventions/Treatment      There were no vitals filed for this visit.    Medications Reviewed Today     Reviewed by Ermalinda Lenn HERO, LCSW (Social Worker) on 08/02/24 at 1247  Med List Status: <None>   Medication Order Taking? Sig Documenting Provider Last Dose Status Informant  acetaminophen  (TYLENOL ) 650 MG suppository [id number]  Yes Place 1 suppository (650 mg total) rectally every 4 (four) hours as needed. Janit Kast, PA-C  Active   buPROPion  (WELLBUTRIN  SR) 150 MG 12 hr tablet [id number] Yes Take 1 pill in  the AM for 1 week, then increase to 1 pill BID Vicci, Megan P, DO  Active   cariprazine  (VRAYLAR ) 1.5 MG capsule [id number] Yes Take 1 capsule (1.5 mg total) by mouth daily. Johnson, Megan P, DO  Active   cyanocobalamin  (VITAMIN B12) 1000 MCG tablet [id number] Yes Take 1 tablet (1,000 mcg total) by mouth daily. Johnson, Megan P, DO  Active   cyclobenzaprine  (FLEXERIL ) 5 MG tablet [id number] Yes Take 1 tablet (5 mg total) by mouth 3 (three) times daily as needed. Brimage, Vondra, DO  Active   fluticasone  (FLONASE ) 50 MCG/ACT nasal spray [id number] Yes Place 2 sprays into both nostrils daily. Melvin Pao, NP  Active   Galcanezumab -gnlm (EMGALITY ) 120 MG/ML SOAJ [id number] Yes Inject 120 mg into the skin every 30 (thirty) days. Johnson, Megan P, DO  Active   ibuprofen  (ADVIL ) 800 MG tablet [id number] Yes Take 1 tablet (800 mg total) by mouth every 8 (eight) hours as needed. Johnson, Megan P, DO  Active   Levonorgestrel  (KYLEENA ) 19.5 MG IUD [id number] Yes 1 Device by Intrauterine route once. Lenon Delaine SAILOR, CNM  Active Pharmacy Records  naltrexone  (DEPADE) 50 MG tablet [id number] Yes Take 0.5 tablets (25 mg total) by mouth daily. Vicci, Megan P, DO  Active   naproxen  (NAPROSYN ) 500 MG tablet [id number] Yes Take 1 tablet (500 mg total) by mouth 2 (two) times daily with a meal. Melvin Pao, NP  Active   SUMAtriptan  (IMITREX ) 50 MG tablet [id number] Yes Take 1 tablet (50 mg total) by mouth every 2 (two) hours as needed for migraine. May repeat in 2 hours if headache persists or recurs. Johnson, Megan P, DO  Active   zonisamide  (ZONEGRAN ) 25 MG capsule [id number] Yes Take 1 capsule (25 mg total) by mouth daily. Janjua, Rashid M, MD  Active             Recommendation:   PCP Follow-up Continued follow up with [facility] for individual and group counseling  Follow Up Plan:   Closing From:  Complex Care Management   Maral Lampe, LCSW [place]  [facility],  [facility] Licensed Clinical Social Worker  Direct Dial: [phone]

## 2024-08-02 NOTE — Patient Instructions (Signed)
 Visit Information  Thank you for taking time to visit with me today. Please don't hesitate to contact me if I can be of assistance to you before our next scheduled appointment.  Your next care management appointment is no further scheduled appointments.   Closing From: Complex Care Management.  Please call the care guide team at [phone] if you need to cancel, schedule, or reschedule an appointment.   Please call the Suicide and Crisis Lifeline: 988 call the [place]  National Suicide Prevention Lifeline: (979)564-3247 or TTY: (386)747-1104 TTY 5317774642) to talk to a trained counselor call 1-800-273-TALK (toll free, 24 hour hotline) call 911 if you are experiencing a Mental Health or Behavioral Health Crisis or need someone to talk to.  Adelyne Marchese, LCSW [place]  [facility], [facility] Licensed Clinical Social Worker  Direct Dial: [phone]      blank

## 2024-08-05 NOTE — Progress Notes (Deleted)
 Sleep Medicine   Office Visit  Patient Name: Alexis Rangel DOB: 1986/05/29 MRN [id number]    Chief Complaint: ***  Brief History:  Alexis Rangel presents for an initial consult for sleep evaluation and to establish care. Patient has a *** history of ***. Sleep quality is ***. This is noted *** nights. The patient's bed partner reports *** at night. The patient relates the following symptoms: *** are also present. The patient goes to sleep at *** and wakes up at ***. Patient reports waking up at least *** times in between and has *** trouble returning to sleep. Sleep quality is *** when outside home environment.  Patient has noted *** of her legs at night that would disrupt her sleep.  The patient reports *** as unusual behavior during the night.  The patient reports *** as a history of psychiatric problems. The Epworth Sleepiness Score is *** out of 24.  The patient reports cardiovascular risk factors as following: ***. The patient reports ***.   ROS  General: (-) fever, (-) chills, (-) night sweat Nose and Sinuses: (-) nasal stuffiness or itchiness, (-) postnasal drip, (-) nosebleeds, (-) sinus trouble. Mouth and Throat: (-) sore throat, (-) hoarseness. Neck: (-) swollen glands, (-) enlarged thyroid , (-) neck pain. Respiratory: *** cough, *** shortness of breath, *** wheezing. Neurologic: *** numbness, *** tingling. Psychiatric: *** anxiety, *** depression Sleep behavior: ***sleep paralysis ***hypnogogic hallucinations ***dream enactment      ***vivid dreams ***cataplexy ***night terrors ***sleep walking   Current Medication: Outpatient Encounter Medications as of 08/08/2024  Medication Sig   acetaminophen  (TYLENOL ) 650 MG suppository Place 1 suppository (650 mg total) rectally every 4 (four) hours as needed.   buPROPion  (WELLBUTRIN  SR) 150 MG 12 hr tablet Take 1 pill in the AM for 1 week, then increase to 1 pill BID   cariprazine  (VRAYLAR ) 1.5 MG capsule Take 1 capsule (1.5 mg  total) by mouth daily.   cyanocobalamin  (VITAMIN B12) 1000 MCG tablet Take 1 tablet (1,000 mcg total) by mouth daily.   cyclobenzaprine  (FLEXERIL ) 5 MG tablet Take 1 tablet (5 mg total) by mouth 3 (three) times daily as needed.   fluticasone  (FLONASE ) 50 MCG/ACT nasal spray Place 2 sprays into both nostrils daily.   Galcanezumab -gnlm (EMGALITY ) 120 MG/ML SOAJ Inject 120 mg into the skin every 30 (thirty) days.   ibuprofen  (ADVIL ) 800 MG tablet Take 1 tablet (800 mg total) by mouth every 8 (eight) hours as needed.   Levonorgestrel  (KYLEENA ) 19.5 MG IUD 1 Device by Intrauterine route once.   naltrexone  (DEPADE) 50 MG tablet Take 0.5 tablets (25 mg total) by mouth daily.   naproxen  (NAPROSYN ) 500 MG tablet Take 1 tablet (500 mg total) by mouth 2 (two) times daily with a meal.   SUMAtriptan  (IMITREX ) 50 MG tablet Take 1 tablet (50 mg total) by mouth every 2 (two) hours as needed for migraine. May repeat in 2 hours if headache persists or recurs.   zonisamide  (ZONEGRAN ) 25 MG capsule Take 1 capsule (25 mg total) by mouth daily.   No facility-administered encounter medications on file as of 08/08/2024.    Surgical History: Past Surgical History:  Procedure Laterality Date   ccy  06/09/2014   pt states she does not remember this surgery   CHOLECYSTECTOMY     TONSILLECTOMY AND ADENOIDECTOMY     WISDOM TOOTH EXTRACTION      Medical History: Past Medical History:  Diagnosis Date   Bipolar 1 disorder (HCC)    Depression  Headache    PCO (polycystic ovaries)     Family History: Non contributory to the present illness  Social History: Social History   Socioeconomic History   Marital status: Single    Spouse name: Not on file   Number of children: 0   Years of education: Not on file   Highest education level: Not on file  Occupational History    Employer: [employer]  Tobacco Use   Smoking status: Never    Passive exposure: Current   Smokeless tobacco: Never  Vaping Use    Vaping status: Never Used  Substance and Sexual Activity   Alcohol use: Not Currently    Comment: occasionally   Drug use: Never   Sexual activity: Yes    Birth control/protection: I.U.D.    Comment: kyleena - inserted 2022  Other Topics Concern   Not on file  Social History Narrative   Not on file   Social Drivers of Health   Financial Resource Strain: Low Risk  (06/14/2024)   Overall Financial Resource Strain (CARDIA)    Difficulty of Paying Living Expenses: Not very hard  Food Insecurity: Food Insecurity Present (04/22/2024)   Hunger Vital Sign    Worried About Running Out of Food in the Last Year: Sometimes true    Ran Out of Food in the Last Year: Never true  Transportation Needs: No Transportation Needs (04/22/2024)   PRAPARE - Administrator, Civil Service (Medical): No    Lack of Transportation (Non-Medical): No  Physical Activity: Sufficiently Active (06/14/2024)   Exercise Vital Sign    Days of Exercise per Week: 7 days    Minutes of Exercise per Session: 30 min  Stress: Not on file  Social Connections: Socially Isolated (06/14/2024)   Social Connection and Isolation Panel    Frequency of Communication with Friends and Family: More than three times a week    Frequency of Social Gatherings with Friends and Family: More than three times a week    Attends Religious Services: Never    Database Administrator or Organizations: No    Attends Banker Meetings: Never    Marital Status: Never married  Intimate Partner Violence: Not At Risk (04/22/2024)   Humiliation, Afraid, Rape, and Kick questionnaire    Fear of Current or Ex-Partner: No    Emotionally Abused: No    Physically Abused: No    Sexually Abused: No    Vital Signs: There were no vitals taken for this visit. There is no height or weight on file to calculate BMI.   Examination: General Appearance: The patient is well-developed, well-nourished, and in no distress. Neck Circumference:  *** Skin: Gross inspection of skin unremarkable. Head: normocephalic, no gross deformities. Eyes: no gross deformities noted. ENT: ears appear grossly normal Neurologic: Alert and oriented. No involuntary movements.    STOP BANG RISK ASSESSMENT S (snore) Have you been told that you snore?     YES/N   T (tired) Are you often tired, fatigued, or sleepy during the day?   YES/NO  O (obstruction) Do you stop breathing, choke, or gasp during sleep? YES/NO   P (pressure) Do you have or are you being treated for high blood pressure? YES/NO   B (BMI) Is your body index greater than 35 kg/m? YES/NO   A (age) Are you 44 years old or older? NO   N (neck) Do you have a neck circumference greater than 16 inches?   YES/NO   G (  gender) Are you a female? NO   TOTAL STOP/BANG "YES" ANSWERS                                                                A STOP-Bang score of 2 or less is considered low risk, and a score of 5 or more is high risk for having either moderate or severe OSA. For people who score 3 or 4, doctors may need to perform further assessment to determine how likely they are to have OSA.         EPWORTH SLEEPINESS SCALE:  Scale:  (0)= no chance of dozing; (1)= slight chance of dozing; (2)= moderate chance of dozing; (3)= high chance of dozing  Chance  Situtation    Sitting and reading: ***    Watching TV: ***    Sitting Inactive in public: ***    As a passenger in car: ***      Lying down to rest: ***    Sitting and talking: ***    Sitting quielty after lunch: ***    In a car, stopped in traffic: ***   TOTAL SCORE:   *** out of 24    SLEEP STUDIES:  None   LABS: Recent Results (from the past 2160 hours)  Cytology - PAP     Status: None   Collection Time: 06/14/24  4:05 PM  Result Value Ref Range   High risk HPV Negative    Adequacy      Satisfactory for evaluation; transformation zone component ABSENT.   Diagnosis      - Negative for  intraepithelial lesion or malignancy (NILM)   Microorganisms Shift in flora suggestive of bacterial vaginosis    Comment Normal Reference Range HPV - Negative   Bayer DCA Hb A1c Waived     Status: None   Collection Time: 06/14/24  4:08 PM  Result Value Ref Range   HB A1C (BAYER DCA - WAIVED) 5.6 4.8 - 5.6 %    Comment:          Prediabetes: 5.7 - 6.4          Diabetes: >6.4          Glycemic control for adults with diabetes: <7.0   CBC with Differential/Platelet     Status: Abnormal   Collection Time: 06/14/24  4:10 PM  Result Value Ref Range   WBC 15.5 (H) 3.4 - 10.8 x10E3/uL   RBC 4.79 3.77 - 5.28 x10E6/uL   Hemoglobin 12.9 11.1 - 15.9 g/dL   Hematocrit 59.4 65.9 - 46.6 %   MCV 85 79 - 97 fL   MCH 26.9 26.6 - 33.0 pg   MCHC 31.9 31.5 - 35.7 g/dL   RDW 86.6 88.2 - 84.5 %   Platelets 548 (H) 150 - 450 x10E3/uL   Neutrophils 69 Not Estab. %   Lymphs 25 Not Estab. %   Monocytes 4 Not Estab. %   Eos 1 Not Estab. %   Basos 1 Not Estab. %   Neutrophils Absolute 10.9 (H) 1.4 - 7.0 x10E3/uL   Lymphocytes Absolute 3.8 (H) 0.7 - 3.1 x10E3/uL   Monocytes Absolute 0.6 0.1 - 0.9 x10E3/uL   EOS (ABSOLUTE) 0.1 0.0 - 0.4 x10E3/uL   Basophils Absolute 0.1 0.0 - 0.2  x10E3/uL   Immature Granulocytes 0 Not Estab. %   Immature Grans (Abs) 0.0 0.0 - 0.1 x10E3/uL  Comprehensive metabolic panel with GFR     Status: None   Collection Time: 06/14/24  4:10 PM  Result Value Ref Range   Glucose 88 70 - 99 mg/dL   BUN 10 6 - 20 mg/dL   Creatinine, Ser 9.16 0.57 - 1.00 mg/dL   eGFR 94 >40 fO/fpw/8.26   BUN/Creatinine Ratio 12 9 - 23   Sodium 140 134 - 144 mmol/L   Potassium 3.9 3.5 - 5.2 mmol/L   Chloride 102 96 - 106 mmol/L   CO2 21 20 - 29 mmol/L   Calcium 9.2 8.7 - 10.2 mg/dL   Total Protein 6.7 6.0 - 8.5 g/dL   Albumin 4.0 3.9 - 4.9 g/dL   Globulin, Total 2.7 1.5 - 4.5 g/dL   Bilirubin Total 0.2 0.0 - 1.2 mg/dL   Alkaline Phosphatase 111 41 - 116 IU/L   AST 16 0 - 40 IU/L   ALT 17 0 - 32  IU/L  Lipid Panel w/o Chol/HDL Ratio     Status: Abnormal   Collection Time: 06/14/24  4:10 PM  Result Value Ref Range   Cholesterol, Total 203 (H) 100 - 199 mg/dL   Triglycerides 97 0 - 149 mg/dL   HDL 45 >60 mg/dL   VLDL Cholesterol Cal 18 5 - 40 mg/dL   LDL Chol Calc (NIH) 859 (H) 0 - 99 mg/dL  TSH     Status: None   Collection Time: 06/14/24  4:10 PM  Result Value Ref Range   TSH 2.100 0.450 - 4.500 uIU/mL  B12     Status: None   Collection Time: 06/14/24  4:10 PM  Result Value Ref Range   Vitamin B-12 360 232 - 1,245 pg/mL    Radiology: No results found.  No results found.  No results found.    Assessment and Plan: Patient Active Problem List   Diagnosis Date Noted   IFG (impaired fasting glucose) 06/14/2024   Family history of heart disease 11/11/2022   Polycystic ovary syndrome 10/17/2022   Benign intracranial hypertension 01/07/2022   Arthralgia of right ankle 06/13/2021   Inflammatory pain 06/13/2021   Sprain of right ankle 06/13/2021   Pseudotumor cerebri 03/18/2020   B12 deficiency 01/17/2019   Bipolar 2 disorder (HCC) 06/11/2017   Idiopathic intracranial hypertension 05/25/2017   Papilledema 05/01/2017   Pelvic pain in female 05/20/2016   Hypocalcemia 03/11/2016   Hyperinsulinemia 03/11/2016   Morbid obesity (HCC) 10/17/2015   Migraine 10/17/2015   Cholelithiasis 10/16/2015     PLAN OSA:   Patient evaluation suggests high risk of sleep disordered breathing due to *** Patient has comorbid cardiovascular risk factors including: *** which could be exacerbated by pathologic sleep-disordered breathing.  Suggest: *** to assess/treat the patient's sleep disordered breathing. The patient was also counselled on *** to optimize sleep health.  PLAN hypersomnia:  Patient evaluation suggests significant daytime hypersomnia.  The Epworth Sleepiness Score is elevated at *** out of 24. Patient *** drowsy driving. The patient *** MVA due to sleepiness.  The  patient *** restless leg symptoms which exacerbate *** for *** nights per week. The patient *** periodic limb movements which exacerbate ***  for *** nights per week. Suggest: ***  Also suggest ***  PLAN insomnia:  Patient evaluation suggests *** insomnia. This is a chronic disorder. This has been a concern for *** and causes impaired daytime functioning. The  patient exhibits comorbid ***  The history *** suggest the insomnia predates the use of hypnotic medications. The symptoms *** with the discontinuation of these medications. There is no obvious medical, psychiatric or pharmacologic abuse issues ot account for the insomnia.  Treatment recommendations include: *** The patient should maintain a sleep log and calculate total sleep time for 1-2 weeks. Set bed and wake times for achieve 85% sleep efficiency for one week. Once this is achieved  time in bed can be gradually increased. A pharmacologic treatment approach would include a trial of *** for the next ***  months. During this time the patient is to maintain a sleep diary to track progress.    ***  General Counseling: I have discussed the findings of the evaluation and examination with Fatema.  I have also discussed any further diagnostic evaluation thatmay be needed or ordered today. Ailis verbalizes understanding of the findings of todays visit. We also reviewed her medications today and discussed drug interactions and side effects including but not limited excessive drowsiness and altered mental states. We also discussed that there is always a risk not just to her but also people around her. she has been encouraged to call the office with any questions or concerns that should arise related to todays visit.  No orders of the defined types were placed in this encounter.       I have personally obtained a history, evaluated the patient, evaluated pertinent data, formulated the assessment and plan and placed orders.    Elfreda DELENA Bathe,  MD [facility] Diplomate ABMS Pulmonary and Critical Care Medicine Sleep medicine

## 2024-08-08 ENCOUNTER — Ambulatory Visit: Payer: Self-pay

## 2024-08-16 ENCOUNTER — Ambulatory Visit: Admitting: Neurosurgery

## 2024-08-16 DIAGNOSIS — G932 Benign intracranial hypertension: Secondary | ICD-10-CM

## 2024-08-16 NOTE — Progress Notes (Signed)
 Virtual Visit via Telephone Note  I connected with Annabella LITTIE Albee on 08/16/2024 at  5:20 PM EST by telephone and verified that I am speaking with the correct person using two identifiers.  Location: Patient: Home Provider: Clinic   I discussed the limitations, risks, security and privacy concerns of performing an evaluation and management service by telephone and the availability of in person appointments. I also discussed with the patient that there may be a patient responsible charge related to this service. The patient expressed understanding and agreed to proceed.  38 year old lady with idiopathic intracranial hypertension who I have on zonisamide .  I raised the dose from 25 to 50 mg.  She says that with this dose escalation she has done better.  She rates her headaches now at about a 1-2 out of 10.  She is very satisfied with her result.  She plans to continue to work on her weight loss.  I will talk to her again in about 3 months.  If anything changes prior to that, she will call me.    I discussed the assessment and treatment plan with the patient. The patient was provided an opportunity to ask questions and all were answered. The patient agreed with the plan and demonstrated an understanding of the instructions.   The patient was advised to call back or seek an in-person evaluation if the symptoms worsen or if the condition fails to improve as anticipated.  I provided 10 minutes of non-face-to-face time during this encounter.   Laiken Sandy, MD

## 2024-09-06 ENCOUNTER — Encounter: Payer: Self-pay | Admitting: Family Medicine

## 2024-09-06 ENCOUNTER — Ambulatory Visit: Admitting: Family Medicine

## 2024-09-06 DIAGNOSIS — F3181 Bipolar II disorder: Secondary | ICD-10-CM

## 2024-09-06 DIAGNOSIS — G43109 Migraine with aura, not intractable, without status migrainosus: Secondary | ICD-10-CM | POA: Diagnosis not present

## 2024-09-06 MED ORDER — BUPROPION HCL ER (SR) 150 MG PO TB12: ORAL_TABLET | ORAL | 2 refills | Status: AC

## 2024-09-06 MED ORDER — EMGALITY 120 MG/ML ~~LOC~~ SOAJ: 120.0000 mg | SUBCUTANEOUS | 12 refills | Status: AC

## 2024-09-06 NOTE — Progress Notes (Signed)
 "  BP (!) 150/91   Pulse 90   Temp 98 F (36.7 C) (Oral)   Ht 5' 2 (1.575 m)   Wt 236 lb 6.4 oz (107.2 kg)   SpO2 99%   BMI 43.24 kg/m    Subjective:    Patient ID: Alexis Rangel, female    DOB: Mar 29, 1986, 39 y.o.   MRN: [id number]  HPI: Alexis Rangel is a 39 y.o. female  Chief Complaint  Patient presents with   Obesity   Anxiety   OBESITY- has not been able to get her sleep study yet. Stopped both her nalrexone and wellbutrin  due to nausea Duration: chronic Previous attempts at weight loss: yes Complications of obesity: Idiopathic intracranial hypertension, pseudotumor cerebri, IFG Peak weight: 250 Weight loss goal: 175 Weight loss to date: 14lbs Requesting obesity pharmacotherapy: yes Current weight loss supplements/medications: no Previous weight loss supplements/meds: no  Has been following with neurosurgery for her idiopathic intracranial hypertension. Doing well. Headaches are better. Has been doing well on the emgality . Needs refill.   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 Has been talking to a therapist.  Duration: chronic Status:exacerbated Anxious mood: yes  Excessive worrying: yes Irritability: yes  Sweating: no Nausea: no Palpitations:no Hyperventilation: no Panic attacks: yes Agoraphobia: no  Obscessions/compulsions: no Depressed mood: yes    09/06/2024    3:42 PM 08/02/2024   12:52 PM 07/14/2024    3:57 PM 06/16/2024    1:48 PM 04/22/2024   10:56 AM  Depression screen PHQ 2/9  Decreased Interest 0 0 0 2 2  Down, Depressed, Hopeless 0 1 0 1 3  PHQ - 2 Score 0 1 0 3 5  Altered sleeping 0  0 3 3  Tired, decreased energy 1  0 2 3  Change in appetite 1  0 1 1  Feeling bad or failure about yourself  0  0 1 3  Trouble concentrating 0  0 1 1  Moving slowly or fidgety/restless 0  0 0 0  Suicidal thoughts 0  0 0 0  PHQ-9 Score 2  0 11  16   Difficult doing work/chores Not difficult at all  Not difficult at all  Somewhat difficult      Data saved with a previous flowsheet row definition   Anhedonia: no Weight changes: no Insomnia: no   Hypersomnia: no Fatigue/loss of energy: yes Feelings of worthlessness: no Feelings of guilt: no Impaired concentration/indecisiveness: no Suicidal ideations: no  Crying spells: no Recent Stressors/Life Changes: yes   Relationship problems: no   Family stress: no     Financial stress: no    Job stress: yes    Recent death/loss: yes   Relevant past medical, surgical, family and social history reviewed and updated as indicated. Interim medical history since our last visit reviewed. Allergies and medications reviewed and updated.  Review of Systems  Constitutional: Negative.   Respiratory: Negative.    Cardiovascular: Negative.   Gastrointestinal: Negative.   Musculoskeletal: Negative.   Skin: Negative.   Neurological: Negative.   Psychiatric/Behavioral:  Positive for dysphoric mood. Negative for agitation, behavioral problems, confusion, decreased concentration, hallucinations, self-injury, sleep disturbance and suicidal ideas. The patient is nervous/anxious. The patient is not hyperactive.     Per HPI unless specifically indicated above     Objective:    BP (!) 150/91   Pulse 90   Temp 98 F (36.7 C) (Oral)   Ht 5' 2 (1.575 m)   Wt  236 lb 6.4 oz (107.2 kg)   SpO2 99%   BMI 43.24 kg/m   Wt Readings from Last 3 Encounters:  09/06/24 236 lb 6.4 oz (107.2 kg)  07/14/24 237 lb 3.2 oz (107.6 kg)  07/05/24 237 lb (107.5 kg)    Physical Exam Vitals and nursing note reviewed.  Constitutional:      General: She is not in acute distress.    Appearance: Normal appearance. She is obese. She is not ill-appearing, toxic-appearing or diaphoretic.  HENT:     Head: Normocephalic and atraumatic.     Right Ear: External ear normal.     Left Ear: External ear normal.     Nose: Nose normal.     Mouth/Throat:     Mouth: Mucous membranes are moist.      Pharynx: Oropharynx is clear.  Eyes:     General: No scleral icterus.       Right eye: No discharge.        Left eye: No discharge.     Extraocular Movements: Extraocular movements intact.     Conjunctiva/sclera: Conjunctivae normal.     Pupils: Pupils are equal, round, and reactive to light.  Cardiovascular:     Rate and Rhythm: Normal rate and regular rhythm.     Pulses: Normal pulses.     Heart sounds: Normal heart sounds. No murmur heard.    No friction rub. No gallop.  Pulmonary:     Effort: Pulmonary effort is normal. No respiratory distress.     Breath sounds: Normal breath sounds. No stridor. No wheezing, rhonchi or rales.  Chest:     Chest wall: No tenderness.  Musculoskeletal:        General: Normal range of motion.     Cervical back: Normal range of motion and neck supple.  Skin:    General: Skin is warm and dry.     Capillary Refill: Capillary refill takes less than 2 seconds.     Coloration: Skin is not jaundiced or pale.     Findings: No bruising, erythema, lesion or rash.  Neurological:     General: No focal deficit present.     Mental Status: She is alert and oriented to person, place, and time. Mental status is at baseline.  Psychiatric:        Mood and Affect: Mood normal.        Behavior: Behavior normal.        Thought Content: Thought content normal.        Judgment: Judgment normal.     Results for orders placed or performed in visit on 06/14/24  Cytology - PAP   Collection Time: 06/14/24  4:05 PM  Result Value Ref Range   High risk HPV Negative    Adequacy      Satisfactory for evaluation; transformation zone component ABSENT.   Diagnosis      - Negative for intraepithelial lesion or malignancy (NILM)   Microorganisms Shift in flora suggestive of bacterial vaginosis    Comment Normal Reference Range HPV - Negative   Bayer DCA Hb A1c Waived   Collection Time: 06/14/24  4:08 PM  Result Value Ref Range   HB A1C (BAYER DCA - WAIVED) 5.6 4.8 - 5.6  %  CBC with Differential/Platelet   Collection Time: 06/14/24  4:10 PM  Result Value Ref Range   WBC 15.5 (H) 3.4 - 10.8 x10E3/uL   RBC 4.79 3.77 - 5.28 x10E6/uL   Hemoglobin 12.9 11.1 - 15.9 g/dL  Hematocrit 40.5 34.0 - 46.6 %   MCV 85 79 - 97 fL   MCH 26.9 26.6 - 33.0 pg   MCHC 31.9 31.5 - 35.7 g/dL   RDW 86.6 88.2 - 84.5 %   Platelets 548 (H) 150 - 450 x10E3/uL   Neutrophils 69 Not Estab. %   Lymphs 25 Not Estab. %   Monocytes 4 Not Estab. %   Eos 1 Not Estab. %   Basos 1 Not Estab. %   Neutrophils Absolute 10.9 (H) 1.4 - 7.0 x10E3/uL   Lymphocytes Absolute 3.8 (H) 0.7 - 3.1 x10E3/uL   Monocytes Absolute 0.6 0.1 - 0.9 x10E3/uL   EOS (ABSOLUTE) 0.1 0.0 - 0.4 x10E3/uL   Basophils Absolute 0.1 0.0 - 0.2 x10E3/uL   Immature Granulocytes 0 Not Estab. %   Immature Grans (Abs) 0.0 0.0 - 0.1 x10E3/uL  Comprehensive metabolic panel with GFR   Collection Time: 06/14/24  4:10 PM  Result Value Ref Range   Glucose 88 70 - 99 mg/dL   BUN 10 6 - 20 mg/dL   Creatinine, Ser 9.16 0.57 - 1.00 mg/dL   eGFR 94 >40 fO/fpw/8.26   BUN/Creatinine Ratio 12 9 - 23   Sodium 140 134 - 144 mmol/L   Potassium 3.9 3.5 - 5.2 mmol/L   Chloride 102 96 - 106 mmol/L   CO2 21 20 - 29 mmol/L   Calcium 9.2 8.7 - 10.2 mg/dL   Total Protein 6.7 6.0 - 8.5 g/dL   Albumin 4.0 3.9 - 4.9 g/dL   Globulin, Total 2.7 1.5 - 4.5 g/dL   Bilirubin Total 0.2 0.0 - 1.2 mg/dL   Alkaline Phosphatase 111 41 - 116 IU/L   AST 16 0 - 40 IU/L   ALT 17 0 - 32 IU/L  Lipid Panel w/o Chol/HDL Ratio   Collection Time: 06/14/24  4:10 PM  Result Value Ref Range   Cholesterol, Total 203 (H) 100 - 199 mg/dL   Triglycerides 97 0 - 149 mg/dL   HDL 45 >60 mg/dL   VLDL Cholesterol Cal 18 5 - 40 mg/dL   LDL Chol Calc (NIH) 859 (H) 0 - 99 mg/dL  TSH   Collection Time: 06/14/24  4:10 PM  Result Value Ref Range   TSH 2.100 0.450 - 4.500 uIU/mL  B12   Collection Time: 06/14/24  4:10 PM  Result Value Ref Range   Vitamin B-12 360 232  - 1,245 pg/mL      Assessment & Plan:   Problem List Items Addressed This Visit       Cardiovascular and Mediastinum   Migraine   Doing much better. Needs refill on her emgality . Refill sent today.       Relevant Medications   Galcanezumab -gnlm (EMGALITY ) 120 MG/ML SOAJ   buPROPion  (WELLBUTRIN  SR) 150 MG 12 hr tablet     Other   Morbid obesity (HCC) - Primary   Encouraged her to get her sleep study. Will restart wellbutrin . Recheck in about 6 weeks.       Bipolar 2 disorder (HCC)   Anxiety much worse. Will restart wellbutrin  and recheck by message in 2 weeks, if not nauseous will restart vraylar . Call with any concerns.         Follow up plan: Return in about 6 weeks (around 10/18/2024).      "

## 2024-09-06 NOTE — Patient Instructions (Addendum)
 Call to schedule your sleep study: [facility] [address] [place] [address] [phone]

## 2024-09-06 NOTE — Assessment & Plan Note (Signed)
 Anxiety much worse. Will restart wellbutrin  and recheck by message in 2 weeks, if not nauseous will restart vraylar . Call with any concerns.

## 2024-09-06 NOTE — Assessment & Plan Note (Signed)
 Doing much better. Needs refill on her emgality . Refill sent today.

## 2024-09-06 NOTE — Assessment & Plan Note (Signed)
 Encouraged her to get her sleep study. Will restart wellbutrin . Recheck in about 6 weeks.

## 2024-09-12 ENCOUNTER — Other Ambulatory Visit (HOSPITAL_COMMUNITY): Payer: Self-pay

## 2024-10-05 ENCOUNTER — Other Ambulatory Visit (HOSPITAL_COMMUNITY): Payer: Self-pay

## 2024-10-05 ENCOUNTER — Telehealth: Payer: Self-pay

## 2024-10-05 NOTE — Telephone Encounter (Signed)
 Pharmacy Patient Advocate Encounter   Received notification from [employer]base CMM KEY that prior authorization for Emgality  120MG /ML auto-injectors (migraine)  is required/requested.   Insurance verification completed.   The patient is insured through [employer]  .   Per test claim: PA required; PA submitted to above mentioned insurance via Latent Key/confirmation #/[id number] Status is pending

## 2024-10-07 ENCOUNTER — Other Ambulatory Visit (HOSPITAL_COMMUNITY): Payer: Self-pay

## 2024-10-21 ENCOUNTER — Ambulatory Visit: Admitting: Family Medicine

## 2024-11-15 ENCOUNTER — Telehealth: Admitting: Neurosurgery
